# Patient Record
Sex: Male | Born: 1960 | Race: Black or African American | Hispanic: No | Marital: Single | Smoking: Former smoker
Health system: Southern US, Community
[De-identification: ages and names within clinical notes are randomized; demographics above are authoritative.]

## PROBLEM LIST (undated history)

## (undated) DIAGNOSIS — E1140 Type 2 diabetes mellitus with diabetic neuropathy, unspecified: Secondary | ICD-10-CM

## (undated) DIAGNOSIS — I10 Essential (primary) hypertension: Secondary | ICD-10-CM

## (undated) DIAGNOSIS — F209 Schizophrenia, unspecified: Secondary | ICD-10-CM

## (undated) DIAGNOSIS — E7800 Pure hypercholesterolemia, unspecified: Secondary | ICD-10-CM

## (undated) HISTORY — PX: TONSILLECTOMY: SUR1361

---

## 2001-06-11 ENCOUNTER — Emergency Department (HOSPITAL_COMMUNITY): Admission: EM | Admit: 2001-06-11 | Discharge: 2001-06-11 | Payer: Self-pay | Admitting: Emergency Medicine

## 2001-06-16 ENCOUNTER — Emergency Department (HOSPITAL_COMMUNITY): Admission: EM | Admit: 2001-06-16 | Discharge: 2001-06-16 | Payer: Self-pay | Admitting: Emergency Medicine

## 2001-08-16 ENCOUNTER — Emergency Department (HOSPITAL_COMMUNITY): Admission: EM | Admit: 2001-08-16 | Discharge: 2001-08-16 | Payer: Self-pay | Admitting: *Deleted

## 2001-08-27 ENCOUNTER — Emergency Department (HOSPITAL_COMMUNITY): Admission: EM | Admit: 2001-08-27 | Discharge: 2001-08-27 | Payer: Self-pay | Admitting: Internal Medicine

## 2001-11-06 ENCOUNTER — Emergency Department (HOSPITAL_COMMUNITY): Admission: EM | Admit: 2001-11-06 | Discharge: 2001-11-06 | Payer: Self-pay | Admitting: Emergency Medicine

## 2001-11-09 ENCOUNTER — Emergency Department (HOSPITAL_COMMUNITY): Admission: EM | Admit: 2001-11-09 | Discharge: 2001-11-09 | Payer: Self-pay | Admitting: *Deleted

## 2001-12-04 ENCOUNTER — Emergency Department (HOSPITAL_COMMUNITY): Admission: EM | Admit: 2001-12-04 | Discharge: 2001-12-04 | Payer: Self-pay | Admitting: Internal Medicine

## 2001-12-04 ENCOUNTER — Encounter: Payer: Self-pay | Admitting: *Deleted

## 2001-12-23 ENCOUNTER — Emergency Department (HOSPITAL_COMMUNITY): Admission: EM | Admit: 2001-12-23 | Discharge: 2001-12-23 | Payer: Self-pay | Admitting: Emergency Medicine

## 2002-03-15 ENCOUNTER — Emergency Department (HOSPITAL_COMMUNITY): Admission: EM | Admit: 2002-03-15 | Discharge: 2002-03-15 | Payer: Self-pay | Admitting: *Deleted

## 2002-03-19 ENCOUNTER — Emergency Department (HOSPITAL_COMMUNITY): Admission: EM | Admit: 2002-03-19 | Discharge: 2002-03-19 | Payer: Self-pay | Admitting: Emergency Medicine

## 2002-03-20 ENCOUNTER — Emergency Department (HOSPITAL_COMMUNITY): Admission: EM | Admit: 2002-03-20 | Discharge: 2002-03-20 | Payer: Self-pay | Admitting: Emergency Medicine

## 2002-03-21 ENCOUNTER — Emergency Department (HOSPITAL_COMMUNITY): Admission: EM | Admit: 2002-03-21 | Discharge: 2002-03-21 | Payer: Self-pay | Admitting: Emergency Medicine

## 2002-03-23 ENCOUNTER — Emergency Department (HOSPITAL_COMMUNITY): Admission: EM | Admit: 2002-03-23 | Discharge: 2002-03-23 | Payer: Self-pay | Admitting: *Deleted

## 2002-04-12 ENCOUNTER — Emergency Department (HOSPITAL_COMMUNITY): Admission: EM | Admit: 2002-04-12 | Discharge: 2002-04-12 | Payer: Self-pay | Admitting: Emergency Medicine

## 2002-06-27 ENCOUNTER — Emergency Department (HOSPITAL_COMMUNITY): Admission: EM | Admit: 2002-06-27 | Discharge: 2002-06-28 | Payer: Self-pay | Admitting: Emergency Medicine

## 2002-06-28 ENCOUNTER — Emergency Department (HOSPITAL_COMMUNITY): Admission: EM | Admit: 2002-06-28 | Discharge: 2002-06-28 | Payer: Self-pay | Admitting: Emergency Medicine

## 2002-07-06 ENCOUNTER — Emergency Department (HOSPITAL_COMMUNITY): Admission: EM | Admit: 2002-07-06 | Discharge: 2002-07-06 | Payer: Self-pay | Admitting: Emergency Medicine

## 2002-07-15 ENCOUNTER — Emergency Department (HOSPITAL_COMMUNITY): Admission: EM | Admit: 2002-07-15 | Discharge: 2002-07-15 | Payer: Self-pay | Admitting: Emergency Medicine

## 2002-07-21 ENCOUNTER — Emergency Department (HOSPITAL_COMMUNITY): Admission: EM | Admit: 2002-07-21 | Discharge: 2002-07-21 | Payer: Self-pay | Admitting: Emergency Medicine

## 2002-08-06 ENCOUNTER — Emergency Department (HOSPITAL_COMMUNITY): Admission: EM | Admit: 2002-08-06 | Discharge: 2002-08-06 | Payer: Self-pay | Admitting: Emergency Medicine

## 2002-08-16 ENCOUNTER — Emergency Department (HOSPITAL_COMMUNITY): Admission: EM | Admit: 2002-08-16 | Discharge: 2002-08-17 | Payer: Self-pay

## 2002-08-21 ENCOUNTER — Emergency Department (HOSPITAL_COMMUNITY): Admission: EM | Admit: 2002-08-21 | Discharge: 2002-08-21 | Payer: Self-pay | Admitting: Emergency Medicine

## 2002-08-22 ENCOUNTER — Emergency Department (HOSPITAL_COMMUNITY): Admission: EM | Admit: 2002-08-22 | Discharge: 2002-08-22 | Payer: Self-pay | Admitting: Emergency Medicine

## 2002-09-09 ENCOUNTER — Observation Stay (HOSPITAL_COMMUNITY): Admission: EM | Admit: 2002-09-09 | Discharge: 2002-09-10 | Payer: Self-pay | Admitting: Internal Medicine

## 2002-09-09 ENCOUNTER — Encounter: Payer: Self-pay | Admitting: Internal Medicine

## 2002-11-06 ENCOUNTER — Emergency Department (HOSPITAL_COMMUNITY): Admission: EM | Admit: 2002-11-06 | Discharge: 2002-11-06 | Payer: Self-pay | Admitting: *Deleted

## 2002-11-18 ENCOUNTER — Emergency Department (HOSPITAL_COMMUNITY): Admission: EM | Admit: 2002-11-18 | Discharge: 2002-11-19 | Payer: Self-pay | Admitting: Emergency Medicine

## 2002-11-19 ENCOUNTER — Emergency Department (HOSPITAL_COMMUNITY): Admission: EM | Admit: 2002-11-19 | Discharge: 2002-11-19 | Payer: Self-pay | Admitting: Emergency Medicine

## 2003-02-15 ENCOUNTER — Encounter: Payer: Self-pay | Admitting: Emergency Medicine

## 2003-02-15 ENCOUNTER — Emergency Department (HOSPITAL_COMMUNITY): Admission: EM | Admit: 2003-02-15 | Discharge: 2003-02-15 | Payer: Self-pay | Admitting: Emergency Medicine

## 2003-02-25 ENCOUNTER — Emergency Department (HOSPITAL_COMMUNITY): Admission: EM | Admit: 2003-02-25 | Discharge: 2003-02-25 | Payer: Self-pay | Admitting: *Deleted

## 2003-03-19 ENCOUNTER — Emergency Department (HOSPITAL_COMMUNITY): Admission: EM | Admit: 2003-03-19 | Discharge: 2003-03-19 | Payer: Self-pay | Admitting: Internal Medicine

## 2003-04-12 ENCOUNTER — Emergency Department (HOSPITAL_COMMUNITY): Admission: EM | Admit: 2003-04-12 | Discharge: 2003-04-12 | Payer: Self-pay | Admitting: Emergency Medicine

## 2003-04-23 ENCOUNTER — Emergency Department (HOSPITAL_COMMUNITY): Admission: EM | Admit: 2003-04-23 | Discharge: 2003-04-23 | Payer: Self-pay | Admitting: Emergency Medicine

## 2003-05-22 ENCOUNTER — Emergency Department (HOSPITAL_COMMUNITY): Admission: EM | Admit: 2003-05-22 | Discharge: 2003-05-22 | Payer: Self-pay | Admitting: Emergency Medicine

## 2003-06-04 ENCOUNTER — Emergency Department (HOSPITAL_COMMUNITY): Admission: EM | Admit: 2003-06-04 | Discharge: 2003-06-04 | Payer: Self-pay | Admitting: *Deleted

## 2003-07-05 ENCOUNTER — Encounter: Payer: Self-pay | Admitting: *Deleted

## 2003-07-05 ENCOUNTER — Emergency Department (HOSPITAL_COMMUNITY): Admission: EM | Admit: 2003-07-05 | Discharge: 2003-07-05 | Payer: Self-pay | Admitting: *Deleted

## 2003-09-10 ENCOUNTER — Emergency Department (HOSPITAL_COMMUNITY): Admission: EM | Admit: 2003-09-10 | Discharge: 2003-09-10 | Payer: Self-pay | Admitting: Emergency Medicine

## 2003-09-21 ENCOUNTER — Emergency Department (HOSPITAL_COMMUNITY): Admission: EM | Admit: 2003-09-21 | Discharge: 2003-09-21 | Payer: Self-pay | Admitting: Emergency Medicine

## 2003-09-23 ENCOUNTER — Emergency Department (HOSPITAL_COMMUNITY): Admission: EM | Admit: 2003-09-23 | Discharge: 2003-09-23 | Payer: Self-pay | Admitting: Family Medicine

## 2003-10-24 ENCOUNTER — Emergency Department (HOSPITAL_COMMUNITY): Admission: EM | Admit: 2003-10-24 | Discharge: 2003-10-24 | Payer: Self-pay | Admitting: Emergency Medicine

## 2003-10-25 ENCOUNTER — Emergency Department (HOSPITAL_COMMUNITY): Admission: EM | Admit: 2003-10-25 | Discharge: 2003-10-25 | Payer: Self-pay | Admitting: Emergency Medicine

## 2003-10-31 ENCOUNTER — Emergency Department (HOSPITAL_COMMUNITY): Admission: EM | Admit: 2003-10-31 | Discharge: 2003-10-31 | Payer: Self-pay | Admitting: Emergency Medicine

## 2003-11-09 ENCOUNTER — Emergency Department (HOSPITAL_COMMUNITY): Admission: EM | Admit: 2003-11-09 | Discharge: 2003-11-10 | Payer: Self-pay | Admitting: Emergency Medicine

## 2003-11-10 ENCOUNTER — Emergency Department (HOSPITAL_COMMUNITY): Admission: EM | Admit: 2003-11-10 | Discharge: 2003-11-10 | Payer: Self-pay | Admitting: Emergency Medicine

## 2003-11-14 ENCOUNTER — Emergency Department (HOSPITAL_COMMUNITY): Admission: EM | Admit: 2003-11-14 | Discharge: 2003-11-14 | Payer: Self-pay | Admitting: Emergency Medicine

## 2003-11-19 ENCOUNTER — Emergency Department (HOSPITAL_COMMUNITY): Admission: EM | Admit: 2003-11-19 | Discharge: 2003-11-19 | Payer: Self-pay | Admitting: *Deleted

## 2003-12-08 ENCOUNTER — Emergency Department (HOSPITAL_COMMUNITY): Admission: EM | Admit: 2003-12-08 | Discharge: 2003-12-08 | Payer: Self-pay | Admitting: Emergency Medicine

## 2003-12-29 ENCOUNTER — Emergency Department (HOSPITAL_COMMUNITY): Admission: EM | Admit: 2003-12-29 | Discharge: 2003-12-30 | Payer: Self-pay | Admitting: Emergency Medicine

## 2004-02-03 ENCOUNTER — Emergency Department (HOSPITAL_COMMUNITY): Admission: EM | Admit: 2004-02-03 | Discharge: 2004-02-03 | Payer: Self-pay | Admitting: Emergency Medicine

## 2004-02-07 ENCOUNTER — Emergency Department (HOSPITAL_COMMUNITY): Admission: EM | Admit: 2004-02-07 | Discharge: 2004-02-07 | Payer: Self-pay | Admitting: *Deleted

## 2004-02-24 ENCOUNTER — Emergency Department (HOSPITAL_COMMUNITY): Admission: EM | Admit: 2004-02-24 | Discharge: 2004-02-25 | Payer: Self-pay | Admitting: *Deleted

## 2004-03-10 ENCOUNTER — Emergency Department (HOSPITAL_COMMUNITY): Admission: EM | Admit: 2004-03-10 | Discharge: 2004-03-10 | Payer: Self-pay | Admitting: Emergency Medicine

## 2004-03-15 ENCOUNTER — Emergency Department (HOSPITAL_COMMUNITY): Admission: EM | Admit: 2004-03-15 | Discharge: 2004-03-15 | Payer: Self-pay | Admitting: Emergency Medicine

## 2004-03-17 ENCOUNTER — Emergency Department (HOSPITAL_COMMUNITY): Admission: EM | Admit: 2004-03-17 | Discharge: 2004-03-17 | Payer: Self-pay | Admitting: Emergency Medicine

## 2004-03-17 ENCOUNTER — Emergency Department (HOSPITAL_COMMUNITY): Admission: EM | Admit: 2004-03-17 | Discharge: 2004-03-18 | Payer: Self-pay | Admitting: Emergency Medicine

## 2004-04-08 ENCOUNTER — Emergency Department (HOSPITAL_COMMUNITY): Admission: EM | Admit: 2004-04-08 | Discharge: 2004-04-08 | Payer: Self-pay | Admitting: Emergency Medicine

## 2004-05-06 ENCOUNTER — Emergency Department (HOSPITAL_COMMUNITY): Admission: EM | Admit: 2004-05-06 | Discharge: 2004-05-06 | Payer: Self-pay | Admitting: Emergency Medicine

## 2004-05-08 ENCOUNTER — Emergency Department (HOSPITAL_COMMUNITY): Admission: EM | Admit: 2004-05-08 | Discharge: 2004-05-08 | Payer: Self-pay | Admitting: Emergency Medicine

## 2004-05-12 ENCOUNTER — Emergency Department (HOSPITAL_COMMUNITY): Admission: EM | Admit: 2004-05-12 | Discharge: 2004-05-12 | Payer: Self-pay | Admitting: Emergency Medicine

## 2004-05-31 ENCOUNTER — Emergency Department (HOSPITAL_COMMUNITY): Admission: EM | Admit: 2004-05-31 | Discharge: 2004-05-31 | Payer: Self-pay | Admitting: Emergency Medicine

## 2004-06-19 ENCOUNTER — Emergency Department (HOSPITAL_COMMUNITY): Admission: EM | Admit: 2004-06-19 | Discharge: 2004-06-20 | Payer: Self-pay | Admitting: Emergency Medicine

## 2004-06-26 ENCOUNTER — Emergency Department (HOSPITAL_COMMUNITY): Admission: EM | Admit: 2004-06-26 | Discharge: 2004-06-26 | Payer: Self-pay | Admitting: Internal Medicine

## 2004-07-04 ENCOUNTER — Emergency Department (HOSPITAL_COMMUNITY): Admission: EM | Admit: 2004-07-04 | Discharge: 2004-07-04 | Payer: Self-pay | Admitting: Emergency Medicine

## 2004-07-10 ENCOUNTER — Emergency Department (HOSPITAL_COMMUNITY): Admission: EM | Admit: 2004-07-10 | Discharge: 2004-07-11 | Payer: Self-pay | Admitting: Emergency Medicine

## 2004-07-23 ENCOUNTER — Emergency Department (HOSPITAL_COMMUNITY): Admission: EM | Admit: 2004-07-23 | Discharge: 2004-07-23 | Payer: Self-pay | Admitting: Emergency Medicine

## 2004-07-27 ENCOUNTER — Emergency Department (HOSPITAL_COMMUNITY): Admission: EM | Admit: 2004-07-27 | Discharge: 2004-07-27 | Payer: Self-pay | Admitting: *Deleted

## 2004-09-06 ENCOUNTER — Emergency Department (HOSPITAL_COMMUNITY): Admission: EM | Admit: 2004-09-06 | Discharge: 2004-09-06 | Payer: Self-pay | Admitting: Emergency Medicine

## 2004-09-28 ENCOUNTER — Emergency Department (HOSPITAL_COMMUNITY): Admission: EM | Admit: 2004-09-28 | Discharge: 2004-09-28 | Payer: Self-pay | Admitting: Emergency Medicine

## 2004-10-01 ENCOUNTER — Emergency Department (HOSPITAL_COMMUNITY): Admission: EM | Admit: 2004-10-01 | Discharge: 2004-10-01 | Payer: Self-pay | Admitting: Emergency Medicine

## 2004-10-16 ENCOUNTER — Emergency Department (HOSPITAL_COMMUNITY): Admission: EM | Admit: 2004-10-16 | Discharge: 2004-10-16 | Payer: Self-pay | Admitting: *Deleted

## 2004-10-21 ENCOUNTER — Emergency Department (HOSPITAL_COMMUNITY): Admission: EM | Admit: 2004-10-21 | Discharge: 2004-10-21 | Payer: Self-pay | Admitting: Emergency Medicine

## 2004-10-28 ENCOUNTER — Emergency Department (HOSPITAL_COMMUNITY): Admission: EM | Admit: 2004-10-28 | Discharge: 2004-10-28 | Payer: Self-pay | Admitting: Emergency Medicine

## 2004-11-01 ENCOUNTER — Emergency Department (HOSPITAL_COMMUNITY): Admission: EM | Admit: 2004-11-01 | Discharge: 2004-11-01 | Payer: Self-pay | Admitting: Emergency Medicine

## 2004-11-09 ENCOUNTER — Emergency Department (HOSPITAL_COMMUNITY): Admission: EM | Admit: 2004-11-09 | Discharge: 2004-11-09 | Payer: Self-pay | Admitting: Emergency Medicine

## 2004-11-18 ENCOUNTER — Emergency Department (HOSPITAL_COMMUNITY): Admission: EM | Admit: 2004-11-18 | Discharge: 2004-11-18 | Payer: Self-pay | Admitting: *Deleted

## 2004-11-20 ENCOUNTER — Emergency Department (HOSPITAL_COMMUNITY): Admission: EM | Admit: 2004-11-20 | Discharge: 2004-11-20 | Payer: Self-pay | Admitting: Emergency Medicine

## 2004-12-01 ENCOUNTER — Emergency Department (HOSPITAL_COMMUNITY): Admission: EM | Admit: 2004-12-01 | Discharge: 2004-12-02 | Payer: Self-pay | Admitting: Emergency Medicine

## 2004-12-02 ENCOUNTER — Emergency Department (HOSPITAL_COMMUNITY): Admission: EM | Admit: 2004-12-02 | Discharge: 2004-12-02 | Payer: Self-pay | Admitting: Emergency Medicine

## 2004-12-03 ENCOUNTER — Emergency Department (HOSPITAL_COMMUNITY): Admission: EM | Admit: 2004-12-03 | Discharge: 2004-12-04 | Payer: Self-pay | Admitting: Emergency Medicine

## 2004-12-24 ENCOUNTER — Emergency Department (HOSPITAL_COMMUNITY): Admission: EM | Admit: 2004-12-24 | Discharge: 2004-12-24 | Payer: Self-pay | Admitting: Emergency Medicine

## 2005-01-15 ENCOUNTER — Emergency Department (HOSPITAL_COMMUNITY): Admission: EM | Admit: 2005-01-15 | Discharge: 2005-01-15 | Payer: Self-pay | Admitting: Emergency Medicine

## 2005-02-02 ENCOUNTER — Emergency Department (HOSPITAL_COMMUNITY): Admission: EM | Admit: 2005-02-02 | Discharge: 2005-02-02 | Payer: Self-pay | Admitting: Emergency Medicine

## 2005-02-19 ENCOUNTER — Emergency Department (HOSPITAL_COMMUNITY): Admission: EM | Admit: 2005-02-19 | Discharge: 2005-02-19 | Payer: Self-pay | Admitting: Emergency Medicine

## 2005-02-28 ENCOUNTER — Emergency Department (HOSPITAL_COMMUNITY): Admission: EM | Admit: 2005-02-28 | Discharge: 2005-02-28 | Payer: Self-pay | Admitting: Emergency Medicine

## 2005-03-01 ENCOUNTER — Emergency Department (HOSPITAL_COMMUNITY): Admission: EM | Admit: 2005-03-01 | Discharge: 2005-03-01 | Payer: Self-pay | Admitting: *Deleted

## 2005-03-31 ENCOUNTER — Emergency Department (HOSPITAL_COMMUNITY): Admission: EM | Admit: 2005-03-31 | Discharge: 2005-03-31 | Payer: Self-pay | Admitting: *Deleted

## 2005-05-03 ENCOUNTER — Ambulatory Visit: Payer: Self-pay | Admitting: Family Medicine

## 2005-06-04 ENCOUNTER — Ambulatory Visit: Payer: Self-pay | Admitting: Family Medicine

## 2005-09-23 ENCOUNTER — Emergency Department (HOSPITAL_COMMUNITY): Admission: EM | Admit: 2005-09-23 | Discharge: 2005-09-23 | Payer: Self-pay | Admitting: Emergency Medicine

## 2005-10-13 ENCOUNTER — Emergency Department (HOSPITAL_COMMUNITY): Admission: EM | Admit: 2005-10-13 | Discharge: 2005-10-13 | Payer: Self-pay | Admitting: Emergency Medicine

## 2005-11-29 ENCOUNTER — Emergency Department (HOSPITAL_COMMUNITY): Admission: EM | Admit: 2005-11-29 | Discharge: 2005-11-29 | Payer: Self-pay | Admitting: Emergency Medicine

## 2006-12-03 ENCOUNTER — Ambulatory Visit (HOSPITAL_COMMUNITY): Admission: RE | Admit: 2006-12-03 | Discharge: 2006-12-03 | Payer: Self-pay | Admitting: Pulmonary Disease

## 2007-01-15 ENCOUNTER — Emergency Department (HOSPITAL_COMMUNITY): Admission: EM | Admit: 2007-01-15 | Discharge: 2007-01-15 | Payer: Self-pay | Admitting: Emergency Medicine

## 2007-03-21 ENCOUNTER — Emergency Department (HOSPITAL_COMMUNITY): Admission: EM | Admit: 2007-03-21 | Discharge: 2007-03-21 | Payer: Self-pay | Admitting: Emergency Medicine

## 2007-03-27 ENCOUNTER — Emergency Department (HOSPITAL_COMMUNITY): Admission: EM | Admit: 2007-03-27 | Discharge: 2007-03-27 | Payer: Self-pay | Admitting: Emergency Medicine

## 2007-04-01 ENCOUNTER — Emergency Department (HOSPITAL_COMMUNITY): Admission: EM | Admit: 2007-04-01 | Discharge: 2007-04-01 | Payer: Self-pay | Admitting: Emergency Medicine

## 2007-04-17 ENCOUNTER — Emergency Department (HOSPITAL_COMMUNITY): Admission: EM | Admit: 2007-04-17 | Discharge: 2007-04-17 | Payer: Self-pay | Admitting: Emergency Medicine

## 2007-05-21 ENCOUNTER — Emergency Department (HOSPITAL_COMMUNITY): Admission: EM | Admit: 2007-05-21 | Discharge: 2007-05-21 | Payer: Self-pay | Admitting: Emergency Medicine

## 2007-06-05 ENCOUNTER — Emergency Department (HOSPITAL_COMMUNITY): Admission: EM | Admit: 2007-06-05 | Discharge: 2007-06-05 | Payer: Self-pay | Admitting: Emergency Medicine

## 2007-06-12 ENCOUNTER — Emergency Department (HOSPITAL_COMMUNITY): Admission: EM | Admit: 2007-06-12 | Discharge: 2007-06-12 | Payer: Self-pay | Admitting: Emergency Medicine

## 2007-06-24 ENCOUNTER — Emergency Department (HOSPITAL_COMMUNITY): Admission: EM | Admit: 2007-06-24 | Discharge: 2007-06-24 | Payer: Self-pay | Admitting: Emergency Medicine

## 2007-06-29 ENCOUNTER — Emergency Department (HOSPITAL_COMMUNITY): Admission: EM | Admit: 2007-06-29 | Discharge: 2007-06-29 | Payer: Self-pay | Admitting: Emergency Medicine

## 2007-07-07 ENCOUNTER — Ambulatory Visit: Payer: Self-pay | Admitting: Orthopedic Surgery

## 2007-08-21 ENCOUNTER — Ambulatory Visit: Payer: Self-pay | Admitting: Orthopedic Surgery

## 2007-11-11 ENCOUNTER — Emergency Department (HOSPITAL_COMMUNITY): Admission: EM | Admit: 2007-11-11 | Discharge: 2007-11-11 | Payer: Self-pay | Admitting: Emergency Medicine

## 2007-11-19 ENCOUNTER — Ambulatory Visit (HOSPITAL_COMMUNITY): Admission: RE | Admit: 2007-11-19 | Discharge: 2007-11-19 | Payer: Self-pay | Admitting: Pulmonary Disease

## 2007-12-10 ENCOUNTER — Emergency Department (HOSPITAL_COMMUNITY): Admission: EM | Admit: 2007-12-10 | Discharge: 2007-12-10 | Payer: Self-pay | Admitting: Emergency Medicine

## 2007-12-11 ENCOUNTER — Emergency Department (HOSPITAL_COMMUNITY): Admission: EM | Admit: 2007-12-11 | Discharge: 2007-12-11 | Payer: Self-pay | Admitting: Emergency Medicine

## 2007-12-25 ENCOUNTER — Encounter: Payer: Self-pay | Admitting: Family Medicine

## 2008-01-04 ENCOUNTER — Emergency Department (HOSPITAL_COMMUNITY): Admission: EM | Admit: 2008-01-04 | Discharge: 2008-01-04 | Payer: Self-pay | Admitting: Emergency Medicine

## 2008-02-09 ENCOUNTER — Emergency Department (HOSPITAL_COMMUNITY): Admission: EM | Admit: 2008-02-09 | Discharge: 2008-02-09 | Payer: Self-pay | Admitting: Emergency Medicine

## 2008-02-28 ENCOUNTER — Emergency Department (HOSPITAL_COMMUNITY): Admission: EM | Admit: 2008-02-28 | Discharge: 2008-02-28 | Payer: Self-pay | Admitting: Emergency Medicine

## 2008-04-12 ENCOUNTER — Emergency Department (HOSPITAL_COMMUNITY): Admission: EM | Admit: 2008-04-12 | Discharge: 2008-04-12 | Payer: Self-pay | Admitting: Emergency Medicine

## 2008-06-17 ENCOUNTER — Emergency Department (HOSPITAL_COMMUNITY): Admission: EM | Admit: 2008-06-17 | Discharge: 2008-06-17 | Payer: Self-pay | Admitting: Emergency Medicine

## 2008-06-22 ENCOUNTER — Emergency Department (HOSPITAL_COMMUNITY): Admission: EM | Admit: 2008-06-22 | Discharge: 2008-06-22 | Payer: Self-pay | Admitting: Emergency Medicine

## 2008-06-26 ENCOUNTER — Emergency Department (HOSPITAL_COMMUNITY): Admission: EM | Admit: 2008-06-26 | Discharge: 2008-06-26 | Payer: Self-pay | Admitting: Emergency Medicine

## 2008-09-15 ENCOUNTER — Emergency Department (HOSPITAL_COMMUNITY): Admission: EM | Admit: 2008-09-15 | Discharge: 2008-09-16 | Payer: Self-pay | Admitting: Emergency Medicine

## 2008-09-18 ENCOUNTER — Inpatient Hospital Stay (HOSPITAL_COMMUNITY): Admission: EM | Admit: 2008-09-18 | Discharge: 2008-09-24 | Payer: Self-pay | Admitting: Emergency Medicine

## 2008-09-22 ENCOUNTER — Encounter: Payer: Self-pay | Admitting: Internal Medicine

## 2008-12-15 ENCOUNTER — Ambulatory Visit (HOSPITAL_COMMUNITY): Admission: RE | Admit: 2008-12-15 | Discharge: 2008-12-15 | Payer: Self-pay | Admitting: Pulmonary Disease

## 2009-01-14 ENCOUNTER — Emergency Department (HOSPITAL_COMMUNITY): Admission: EM | Admit: 2009-01-14 | Discharge: 2009-01-14 | Payer: Self-pay | Admitting: Emergency Medicine

## 2009-02-02 ENCOUNTER — Emergency Department (HOSPITAL_COMMUNITY): Admission: EM | Admit: 2009-02-02 | Discharge: 2009-02-04 | Payer: Self-pay | Admitting: Emergency Medicine

## 2009-03-10 ENCOUNTER — Emergency Department (HOSPITAL_COMMUNITY): Admission: EM | Admit: 2009-03-10 | Discharge: 2009-03-10 | Payer: Self-pay | Admitting: Emergency Medicine

## 2009-05-30 ENCOUNTER — Other Ambulatory Visit: Payer: Self-pay | Admitting: Emergency Medicine

## 2009-05-30 ENCOUNTER — Inpatient Hospital Stay (HOSPITAL_COMMUNITY): Admission: EM | Admit: 2009-05-30 | Discharge: 2009-06-03 | Payer: Self-pay | Admitting: Psychiatry

## 2009-05-31 ENCOUNTER — Ambulatory Visit: Payer: Self-pay | Admitting: Psychiatry

## 2009-07-04 ENCOUNTER — Inpatient Hospital Stay (HOSPITAL_COMMUNITY): Admission: EM | Admit: 2009-07-04 | Discharge: 2009-07-17 | Payer: Self-pay | Admitting: Emergency Medicine

## 2009-07-13 ENCOUNTER — Encounter: Payer: Self-pay | Admitting: Infectious Disease

## 2009-07-14 ENCOUNTER — Encounter: Payer: Self-pay | Admitting: Internal Medicine

## 2009-07-14 ENCOUNTER — Ambulatory Visit: Payer: Self-pay | Admitting: Infectious Diseases

## 2009-07-14 ENCOUNTER — Ambulatory Visit: Payer: Self-pay | Admitting: Vascular Surgery

## 2009-08-07 ENCOUNTER — Emergency Department (HOSPITAL_COMMUNITY): Admission: EM | Admit: 2009-08-07 | Discharge: 2009-08-07 | Payer: Self-pay | Admitting: Emergency Medicine

## 2009-08-17 ENCOUNTER — Emergency Department (HOSPITAL_COMMUNITY): Admission: EM | Admit: 2009-08-17 | Discharge: 2009-08-17 | Payer: Self-pay | Admitting: Emergency Medicine

## 2009-09-25 ENCOUNTER — Emergency Department (HOSPITAL_COMMUNITY): Admission: EM | Admit: 2009-09-25 | Discharge: 2009-09-25 | Payer: Self-pay | Admitting: Emergency Medicine

## 2009-09-28 ENCOUNTER — Other Ambulatory Visit: Payer: Self-pay | Admitting: Emergency Medicine

## 2009-09-29 ENCOUNTER — Ambulatory Visit: Payer: Self-pay | Admitting: Psychiatry

## 2009-09-29 ENCOUNTER — Inpatient Hospital Stay (HOSPITAL_COMMUNITY): Admission: RE | Admit: 2009-09-29 | Discharge: 2009-10-03 | Payer: Self-pay | Admitting: Psychiatry

## 2009-10-06 ENCOUNTER — Emergency Department (HOSPITAL_COMMUNITY): Admission: EM | Admit: 2009-10-06 | Discharge: 2009-10-06 | Payer: Self-pay | Admitting: Emergency Medicine

## 2009-10-09 ENCOUNTER — Emergency Department (HOSPITAL_COMMUNITY): Admission: EM | Admit: 2009-10-09 | Discharge: 2009-10-09 | Payer: Self-pay | Admitting: Emergency Medicine

## 2009-10-12 ENCOUNTER — Emergency Department (HOSPITAL_COMMUNITY): Admission: EM | Admit: 2009-10-12 | Discharge: 2009-10-13 | Payer: Self-pay | Admitting: Emergency Medicine

## 2009-10-25 ENCOUNTER — Inpatient Hospital Stay (HOSPITAL_COMMUNITY): Admission: EM | Admit: 2009-10-25 | Discharge: 2009-10-29 | Payer: Self-pay | Admitting: Emergency Medicine

## 2009-10-26 ENCOUNTER — Ambulatory Visit: Payer: Self-pay | Admitting: Vascular Surgery

## 2009-10-26 ENCOUNTER — Encounter (INDEPENDENT_AMBULATORY_CARE_PROVIDER_SITE_OTHER): Payer: Self-pay | Admitting: Internal Medicine

## 2009-11-01 ENCOUNTER — Other Ambulatory Visit: Payer: Self-pay | Admitting: Emergency Medicine

## 2009-11-01 ENCOUNTER — Other Ambulatory Visit: Payer: Self-pay

## 2009-11-01 ENCOUNTER — Ambulatory Visit: Payer: Self-pay | Admitting: Psychiatry

## 2009-11-02 ENCOUNTER — Inpatient Hospital Stay (HOSPITAL_COMMUNITY): Admission: AD | Admit: 2009-11-02 | Discharge: 2009-11-03 | Payer: Self-pay | Admitting: Psychiatry

## 2009-11-14 ENCOUNTER — Emergency Department (HOSPITAL_COMMUNITY): Admission: EM | Admit: 2009-11-14 | Discharge: 2009-11-15 | Payer: Self-pay | Admitting: Emergency Medicine

## 2009-11-22 ENCOUNTER — Emergency Department (HOSPITAL_COMMUNITY): Admission: EM | Admit: 2009-11-22 | Discharge: 2009-11-22 | Payer: Self-pay | Admitting: Emergency Medicine

## 2009-11-28 ENCOUNTER — Emergency Department (HOSPITAL_COMMUNITY): Admission: EM | Admit: 2009-11-28 | Discharge: 2009-11-29 | Payer: Self-pay | Admitting: Emergency Medicine

## 2009-12-12 ENCOUNTER — Ambulatory Visit (HOSPITAL_BASED_OUTPATIENT_CLINIC_OR_DEPARTMENT_OTHER): Admission: RE | Admit: 2009-12-12 | Discharge: 2009-12-12 | Payer: Self-pay | Admitting: Family Medicine

## 2009-12-18 ENCOUNTER — Ambulatory Visit: Payer: Self-pay | Admitting: Internal Medicine

## 2010-01-07 ENCOUNTER — Emergency Department (HOSPITAL_COMMUNITY): Admission: EM | Admit: 2010-01-07 | Discharge: 2010-01-08 | Payer: Self-pay | Admitting: Emergency Medicine

## 2010-04-12 ENCOUNTER — Emergency Department (HOSPITAL_COMMUNITY): Admission: EM | Admit: 2010-04-12 | Discharge: 2010-04-12 | Payer: Self-pay | Admitting: Emergency Medicine

## 2010-04-24 ENCOUNTER — Emergency Department (HOSPITAL_COMMUNITY): Admission: EM | Admit: 2010-04-24 | Discharge: 2010-04-24 | Payer: Self-pay | Admitting: Emergency Medicine

## 2010-07-29 ENCOUNTER — Emergency Department (HOSPITAL_COMMUNITY): Admission: EM | Admit: 2010-07-29 | Discharge: 2010-07-30 | Payer: Self-pay | Admitting: Emergency Medicine

## 2010-07-30 IMAGING — CT CT EXTREM LOW W/ CM*L*
1 of 3 series · 1 of 14 positions shown, 2 images · IV contrast (agent unspecified)
Comparison: None available.

CLINICAL DATA: Swelling and pain.  Cellulitis.  Question abscess.

CT LEFT LOWER LEG WITH CONTRAST
TECHNIQUE: Multidetector CT imaging of the left lower leg was
performed according to the standard protocol following intravenous
contrast administration. Multiplanar CT image reconstructions were
also generated.
Contrast: 100 ml [id number].

[Series 2: control scan 2.0 b60s · axial · 0.32mm/px · z∈[-1140,-1140]mm · 1 of 1 slices shown, 2 images]
[im 1/1  soft-tissue]
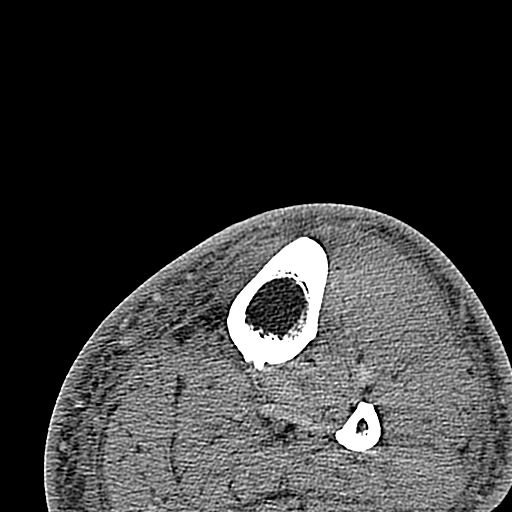
[im 1/1  bone]
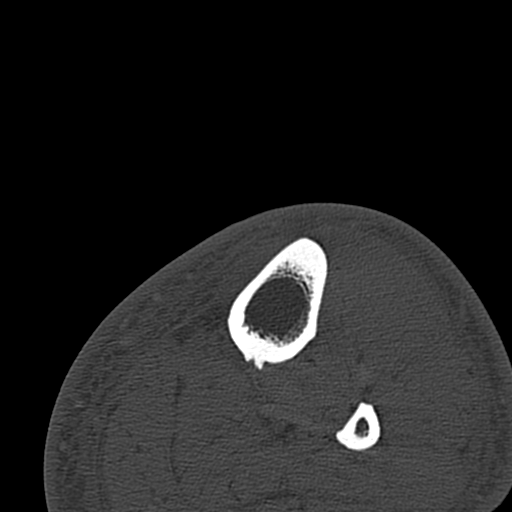

[1 of 14 positions shown; findings below may reference images not displayed]

FINDINGS: There is diffuse subcutaneous edema about the left lower
leg.  No rim enhancing fluid collection to suggest abscess is
identified.  No intramuscular fluid collection is seen.  Fat planes
are preserved.  No notable fascial enhancement is identified.  No
bony destructive change is seen.  There is no fracture.
IMPRESSION: Cellulitis without underlying abscess or CT evidence of
osteomyelitis.  No evidence of myositis.

## 2010-08-06 ENCOUNTER — Emergency Department (HOSPITAL_COMMUNITY): Admission: EM | Admit: 2010-08-06 | Discharge: 2010-08-06 | Payer: Self-pay | Admitting: Emergency Medicine

## 2010-08-23 ENCOUNTER — Emergency Department (HOSPITAL_COMMUNITY): Admission: EM | Admit: 2010-08-23 | Discharge: 2010-08-24 | Payer: Self-pay | Admitting: Emergency Medicine

## 2010-09-05 ENCOUNTER — Encounter: Payer: Self-pay | Admitting: Internal Medicine

## 2010-10-14 ENCOUNTER — Emergency Department (HOSPITAL_COMMUNITY): Admission: EM | Admit: 2010-10-14 | Discharge: 2010-10-14 | Payer: Self-pay | Admitting: Emergency Medicine

## 2010-10-21 ENCOUNTER — Emergency Department (HOSPITAL_COMMUNITY): Admission: EM | Admit: 2010-10-21 | Discharge: 2010-10-22 | Payer: Self-pay | Admitting: Emergency Medicine

## 2010-11-09 ENCOUNTER — Emergency Department (HOSPITAL_COMMUNITY): Admission: EM | Admit: 2010-11-09 | Discharge: 2010-11-09 | Payer: Self-pay | Admitting: Emergency Medicine

## 2010-11-30 ENCOUNTER — Emergency Department (HOSPITAL_COMMUNITY): Admission: EM | Admit: 2010-11-30 | Discharge: 2010-08-12 | Payer: Self-pay | Admitting: Emergency Medicine

## 2010-11-30 ENCOUNTER — Emergency Department (HOSPITAL_COMMUNITY): Admission: EM | Admit: 2010-11-30 | Discharge: 2010-03-20 | Payer: Self-pay | Admitting: Emergency Medicine

## 2011-01-14 ENCOUNTER — Encounter: Payer: Self-pay | Admitting: Family Medicine

## 2011-01-23 NOTE — Letter (Signed)
Summary: CMN for Supplies / [facility]  CMN for Supplies / [facility]   Imported By: Lennie Odor 09/07/2010 13:54:55  _____________________________________________________________________  External Attachment:    Type:   Image     Comment:   External Document

## 2011-01-23 NOTE — Letter (Signed)
Summary: Historic Patient File  Historic Patient File   Imported By: Lind Guest 11/14/2010 11:17:17  _____________________________________________________________________  External Attachment:    Type:   Image     Comment:   External Document

## 2011-03-06 LAB — BASIC METABOLIC PANEL
Calcium: 9 mg/dL (ref 8.4–10.5)
Chloride: 101 mEq/L (ref 96–112)
Creatinine, Ser: 1.42 mg/dL (ref 0.4–1.5)
GFR calc Af Amer: >60 mL/min (ref >60)
GFR calc non Af Amer: 53 mL/min — ABNORMAL LOW (ref >60)

## 2011-03-06 LAB — URINALYSIS, ROUTINE W REFLEX MICROSCOPIC
Glucose, UA: NEGATIVE mg/dL
Nitrite: NEGATIVE
Protein, ur: NEGATIVE mg/dL
pH: 6 (ref 5.0–8.0)

## 2011-03-06 LAB — DIFFERENTIAL
Basophils Absolute: 0.1 10*3/uL (ref 0.0–0.1)
Lymphocytes Relative: 17 % (ref 12–46)
Lymphs Abs: 2.8 10*3/uL (ref 0.7–4.0)
Neutrophils Relative %: 52 % (ref 43–77)

## 2011-03-06 LAB — ETHANOL: Alcohol, Ethyl (B): <5 mg/dL (ref 0–10)

## 2011-03-06 LAB — CBC
Platelets: 214 10*3/uL (ref 150–400)
RBC: 4.03 MIL/uL — ABNORMAL LOW (ref 4.22–5.81)
WBC: 16.9 10*3/uL — ABNORMAL HIGH (ref 4.0–10.5)

## 2011-03-06 LAB — RAPID URINE DRUG SCREEN, HOSP PERFORMED
Barbiturates: NOT DETECTED
Benzodiazepines: POSITIVE — AB
Cocaine: NOT DETECTED

## 2011-03-07 LAB — CBC
HCT: 34.3 % — ABNORMAL LOW (ref 39.0–52.0)
Hemoglobin: 11.5 g/dL — ABNORMAL LOW (ref 13.0–17.0)
Hemoglobin: 11.6 g/dL — ABNORMAL LOW (ref 13.0–17.0)
MCH: 30.3 pg (ref 26.0–34.0)
MCH: 30.6 pg (ref 26.0–34.0)
MCHC: 33.4 g/dL (ref 30.0–36.0)
MCV: 90.5 fL (ref 78.0–100.0)
MCV: 90.5 fL (ref 78.0–100.0)
Platelets: 211 10*3/uL (ref 150–400)
RBC: 3.79 MIL/uL — ABNORMAL LOW (ref 4.22–5.81)
RBC: 3.79 MIL/uL — ABNORMAL LOW (ref 4.22–5.81)
WBC: 7.8 10*3/uL (ref 4.0–10.5)

## 2011-03-07 LAB — BASIC METABOLIC PANEL
CO2: 33 mEq/L — ABNORMAL HIGH (ref 19–32)
Calcium: 9 mg/dL (ref 8.4–10.5)
Chloride: 96 mEq/L (ref 96–112)
Glucose, Bld: 92 mg/dL (ref 70–99)
Sodium: 137 mEq/L (ref 135–145)

## 2011-03-07 LAB — COMPREHENSIVE METABOLIC PANEL
Albumin: 3.6 g/dL (ref 3.5–5.2)
Alkaline Phosphatase: 52 U/L (ref 39–117)
BUN: 11 mg/dL (ref 6–23)
CO2: 32 mEq/L (ref 19–32)
Chloride: 96 mEq/L (ref 96–112)
Creatinine, Ser: 1.2 mg/dL (ref 0.4–1.5)
GFR calc non Af Amer: >60 mL/min (ref >60)
Glucose, Bld: 115 mg/dL — ABNORMAL HIGH (ref 70–99)
Potassium: 4.1 mEq/L (ref 3.5–5.1)
Total Bilirubin: 0.1 mg/dL — ABNORMAL LOW (ref 0.3–1.2)

## 2011-03-07 LAB — DIFFERENTIAL
Basophils Relative: 0 % (ref 0–1)
Eosinophils Absolute: 0.2 10*3/uL (ref 0.0–0.7)
Eosinophils Absolute: 0.3 10*3/uL (ref 0.0–0.7)
Eosinophils Relative: 5 % (ref 0–5)
Lymphocytes Relative: 23 % (ref 12–46)
Lymphs Abs: 1.6 10*3/uL (ref 0.7–4.0)
Lymphs Abs: 1.8 10*3/uL (ref 0.7–4.0)
Monocytes Absolute: 0.7 10*3/uL (ref 0.1–1.0)
Monocytes Relative: 11 % (ref 3–12)
Monocytes Relative: 8 % (ref 3–12)
Neutro Abs: 5.1 10*3/uL (ref 1.7–7.7)
Neutrophils Relative %: 60 % (ref 43–77)

## 2011-03-07 LAB — POCT CARDIAC MARKERS: Myoglobin, poc: 58.4 ng/mL (ref 12–200)

## 2011-03-08 LAB — GLUCOSE, CAPILLARY
Glucose-Capillary: 152 mg/dL — ABNORMAL HIGH (ref 70–99)
Glucose-Capillary: 171 mg/dL — ABNORMAL HIGH (ref 70–99)

## 2011-03-09 LAB — COMPREHENSIVE METABOLIC PANEL
ALT: 33 U/L (ref 0–53)
AST: 28 U/L (ref 0–37)
Alkaline Phosphatase: 46 U/L (ref 39–117)
CO2: 29 mEq/L (ref 19–32)
Chloride: 101 mEq/L (ref 96–112)
GFR calc Af Amer: >60 mL/min (ref >60)
GFR calc non Af Amer: >60 mL/min (ref >60)
Potassium: 4.1 mEq/L (ref 3.5–5.1)
Sodium: 139 mEq/L (ref 135–145)
Total Bilirubin: 0.4 mg/dL (ref 0.3–1.2)

## 2011-03-09 LAB — POCT I-STAT, CHEM 8
BUN: 18 mg/dL (ref 6–23)
Chloride: 97 mEq/L (ref 96–112)
HCT: 41 % (ref 39.0–52.0)
Potassium: 3.6 mEq/L (ref 3.5–5.1)
Sodium: 138 mEq/L (ref 135–145)

## 2011-03-09 LAB — URINALYSIS, ROUTINE W REFLEX MICROSCOPIC
Bilirubin Urine: NEGATIVE
Glucose, UA: NEGATIVE mg/dL
Glucose, UA: NEGATIVE mg/dL
Hgb urine dipstick: NEGATIVE
Hgb urine dipstick: NEGATIVE
Ketones, ur: NEGATIVE mg/dL
Nitrite: NEGATIVE
Nitrite: NEGATIVE
Specific Gravity, Urine: 1.018 (ref 1.005–1.030)
Specific Gravity, Urine: 1.016 (ref 1.005–1.030)
Specific Gravity, Urine: 1.009 (ref 1.005–1.030)
Urobilinogen, UA: 1 mg/dL (ref 0.0–1.0)
pH: 5.5 (ref 5.0–8.0)
pH: 7.5 (ref 5.0–8.0)

## 2011-03-09 LAB — DIFFERENTIAL
Basophils Absolute: 0 10*3/uL (ref 0.0–0.1)
Basophils Absolute: 0 10*3/uL (ref 0.0–0.1)
Eosinophils Absolute: 0.2 10*3/uL (ref 0.0–0.7)
Eosinophils Relative: 4 % (ref 0–5)
Eosinophils Relative: 5 % (ref 0–5)
Lymphocytes Relative: 31 % (ref 12–46)
Lymphs Abs: 1.9 10*3/uL (ref 0.7–4.0)
Neutro Abs: 3.2 10*3/uL (ref 1.7–7.7)
Neutrophils Relative %: 54 % (ref 43–77)

## 2011-03-09 LAB — BLOOD GAS, ARTERIAL
Acid-Base Excess: 4.6 mmol/L — ABNORMAL HIGH (ref 0.0–2.0)
Bicarbonate: 29.9 mEq/L — ABNORMAL HIGH (ref 20.0–24.0)
Drawn by: 330991
FIO2: 0.4 %
O2 Saturation: 98.6 %
Patient temperature: 98.6
TCO2: 27.7 mmol/L (ref 0–100)
TCO2: 27.7 mmol/L (ref 0–100)
pH, Arterial: 7.336 — ABNORMAL LOW (ref 7.350–7.450)
pO2, Arterial: 126 mmHg — ABNORMAL HIGH (ref 80.0–100.0)

## 2011-03-09 LAB — URINE CULTURE: Culture  Setup Time: 201108201126

## 2011-03-09 LAB — CBC
Hemoglobin: 11.9 g/dL — ABNORMAL LOW (ref 13.0–17.0)
MCH: 30 pg (ref 26.0–34.0)
MCV: 89 fL (ref 78.0–100.0)
Platelets: 147 10*3/uL — ABNORMAL LOW (ref 150–400)
RBC: 3.96 MIL/uL — ABNORMAL LOW (ref 4.22–5.81)
RBC: 4.09 MIL/uL — ABNORMAL LOW (ref 4.22–5.81)
RDW: 14.7 % (ref 11.5–15.5)
WBC: 6 10*3/uL (ref 4.0–10.5)

## 2011-03-09 LAB — GLUCOSE, CAPILLARY
Glucose-Capillary: 108 mg/dL — ABNORMAL HIGH (ref 70–99)
Glucose-Capillary: 163 mg/dL — ABNORMAL HIGH (ref 70–99)
Glucose-Capillary: 86 mg/dL (ref 70–99)
Glucose-Capillary: 95 mg/dL (ref 70–99)

## 2011-03-09 LAB — VALPROIC ACID LEVEL: Valproic Acid Lvl: 130 ug/mL — ABNORMAL HIGH (ref 50.0–100.0)

## 2011-03-11 LAB — COMPREHENSIVE METABOLIC PANEL
ALT: 20 U/L (ref 0–53)
AST: 21 U/L (ref 0–37)
Alkaline Phosphatase: 61 U/L (ref 39–117)
CO2: 30 mEq/L (ref 19–32)
Calcium: 9 mg/dL (ref 8.4–10.5)
GFR calc Af Amer: >60 mL/min (ref >60)
Glucose, Bld: 275 mg/dL — ABNORMAL HIGH (ref 70–99)
Potassium: 4.1 mEq/L (ref 3.5–5.1)
Sodium: 131 mEq/L — ABNORMAL LOW (ref 135–145)
Total Protein: 7 g/dL (ref 6.0–8.3)

## 2011-03-11 LAB — POCT CARDIAC MARKERS: Troponin i, poc: <0.05 ng/mL (ref 0.00–0.09)

## 2011-03-11 LAB — CBC
Hemoglobin: 11.5 g/dL — ABNORMAL LOW (ref 13.0–17.0)
MCHC: 34.1 g/dL (ref 30.0–36.0)
RBC: 3.86 MIL/uL — ABNORMAL LOW (ref 4.22–5.81)

## 2011-03-11 LAB — DIFFERENTIAL
Basophils Relative: 1 % (ref 0–1)
Eosinophils Absolute: 0.4 10*3/uL (ref 0.0–0.7)
Eosinophils Relative: 5 % (ref 0–5)
Lymphs Abs: 1.4 10*3/uL (ref 0.7–4.0)
Monocytes Relative: 9 % (ref 3–12)

## 2011-03-13 LAB — COMPREHENSIVE METABOLIC PANEL
ALT: 23 U/L (ref 0–53)
AST: 22 U/L (ref 0–37)
Albumin: 3.5 g/dL (ref 3.5–5.2)
Calcium: 9 mg/dL (ref 8.4–10.5)
Creatinine, Ser: 1.17 mg/dL (ref 0.4–1.5)
GFR calc Af Amer: >60 mL/min (ref >60)
Sodium: 139 mEq/L (ref 135–145)
Total Protein: 7.1 g/dL (ref 6.0–8.3)

## 2011-03-13 LAB — URINALYSIS, ROUTINE W REFLEX MICROSCOPIC
Glucose, UA: NEGATIVE mg/dL
Nitrite: NEGATIVE
Specific Gravity, Urine: 1.012 (ref 1.005–1.030)
pH: 5.5 (ref 5.0–8.0)

## 2011-03-13 LAB — DIFFERENTIAL
Basophils Relative: 0 % (ref 0–1)
Lymphocytes Relative: 24 % (ref 12–46)
Monocytes Relative: 11 % (ref 3–12)
Neutro Abs: 3.5 10*3/uL (ref 1.7–7.7)
Neutrophils Relative %: 59 % (ref 43–77)

## 2011-03-13 LAB — CBC
MCHC: 32.8 g/dL (ref 30.0–36.0)
Platelets: 199 10*3/uL (ref 150–400)
RBC: 3.99 MIL/uL — ABNORMAL LOW (ref 4.22–5.81)
RDW: 15.6 % — ABNORMAL HIGH (ref 11.5–15.5)

## 2011-03-13 LAB — GLUCOSE, CAPILLARY: Glucose-Capillary: 168 mg/dL — ABNORMAL HIGH (ref 70–99)

## 2011-03-13 LAB — AMMONIA: Ammonia: 48 umol/L — ABNORMAL HIGH (ref 11–35)

## 2011-03-18 LAB — GLUCOSE, CAPILLARY: Glucose-Capillary: 121 mg/dL — ABNORMAL HIGH (ref 70–99)

## 2011-03-28 LAB — GLUCOSE, CAPILLARY
Glucose-Capillary: 188 mg/dL — ABNORMAL HIGH (ref 70–99)
Glucose-Capillary: 188 mg/dL — ABNORMAL HIGH (ref 70–99)
Glucose-Capillary: 189 mg/dL — ABNORMAL HIGH (ref 70–99)
Glucose-Capillary: 215 mg/dL — ABNORMAL HIGH (ref 70–99)
Glucose-Capillary: 240 mg/dL — ABNORMAL HIGH (ref 70–99)
Glucose-Capillary: 247 mg/dL — ABNORMAL HIGH (ref 70–99)
Glucose-Capillary: 290 mg/dL — ABNORMAL HIGH (ref 70–99)
Glucose-Capillary: 291 mg/dL — ABNORMAL HIGH (ref 70–99)
Glucose-Capillary: 295 mg/dL — ABNORMAL HIGH (ref 70–99)
Glucose-Capillary: 340 mg/dL — ABNORMAL HIGH (ref 70–99)
Glucose-Capillary: 343 mg/dL — ABNORMAL HIGH (ref 70–99)
Glucose-Capillary: 284 mg/dL — ABNORMAL HIGH (ref 70–99)

## 2011-03-28 LAB — URINALYSIS, ROUTINE W REFLEX MICROSCOPIC
Nitrite: NEGATIVE
Specific Gravity, Urine: 1.027 (ref 1.005–1.030)
Urobilinogen, UA: 1 mg/dL (ref 0.0–1.0)

## 2011-03-28 LAB — CBC
HCT: 34.2 % — ABNORMAL LOW (ref 39.0–52.0)
Hemoglobin: 11.6 g/dL — ABNORMAL LOW (ref 13.0–17.0)
Hemoglobin: 12.4 g/dL — ABNORMAL LOW (ref 13.0–17.0)
MCHC: 32.7 g/dL (ref 30.0–36.0)
MCHC: 33.3 g/dL (ref 30.0–36.0)
MCHC: 33.3 g/dL (ref 30.0–36.0)
MCHC: 33.6 g/dL (ref 30.0–36.0)
MCV: 86.6 fL (ref 78.0–100.0)
MCV: 87.6 fL (ref 78.0–100.0)
MCV: 88 fL (ref 78.0–100.0)
MCV: 88.1 fL (ref 78.0–100.0)
Platelets: 137 10*3/uL — ABNORMAL LOW (ref 150–400)
Platelets: 144 10*3/uL — ABNORMAL LOW (ref 150–400)
RBC: 3.41 MIL/uL — ABNORMAL LOW (ref 4.22–5.81)
RBC: 3.95 MIL/uL — ABNORMAL LOW (ref 4.22–5.81)
RBC: 4.07 MIL/uL — ABNORMAL LOW (ref 4.22–5.81)
RBC: 4.08 MIL/uL — ABNORMAL LOW (ref 4.22–5.81)
RBC: 4.27 MIL/uL (ref 4.22–5.81)
RDW: 15.2 % (ref 11.5–15.5)
RDW: 15.7 % — ABNORMAL HIGH (ref 11.5–15.5)
WBC: 11.4 10*3/uL — ABNORMAL HIGH (ref 4.0–10.5)
WBC: 12.2 10*3/uL — ABNORMAL HIGH (ref 4.0–10.5)
WBC: 7.5 10*3/uL (ref 4.0–10.5)

## 2011-03-28 LAB — DIFFERENTIAL
Basophils Relative: 0 % (ref 0–1)
Basophils Relative: 0 % (ref 0–1)
Basophils Relative: 1 % (ref 0–1)
Eosinophils Absolute: 0 10*3/uL (ref 0.0–0.7)
Eosinophils Absolute: 0.3 10*3/uL (ref 0.0–0.7)
Eosinophils Relative: 2 % (ref 0–5)
Lymphs Abs: 0.9 10*3/uL (ref 0.7–4.0)
Monocytes Absolute: 0.9 10*3/uL (ref 0.1–1.0)
Monocytes Absolute: 0.9 10*3/uL (ref 0.1–1.0)
Monocytes Relative: 13 % — ABNORMAL HIGH (ref 3–12)
Monocytes Relative: 6 % (ref 3–12)
Monocytes Relative: 8 % (ref 3–12)
Neutro Abs: 10.6 10*3/uL — ABNORMAL HIGH (ref 1.7–7.7)
Neutro Abs: 9.4 10*3/uL — ABNORMAL HIGH (ref 1.7–7.7)
Neutrophils Relative %: 86 % — ABNORMAL HIGH (ref 43–77)

## 2011-03-28 LAB — COMPREHENSIVE METABOLIC PANEL
ALT: 18 U/L (ref 0–53)
AST: 23 U/L (ref 0–37)
Albumin: 2.8 g/dL — ABNORMAL LOW (ref 3.5–5.2)
Alkaline Phosphatase: 55 U/L (ref 39–117)
Alkaline Phosphatase: 56 U/L (ref 39–117)
BUN: 19 mg/dL (ref 6–23)
CO2: 28 mEq/L (ref 19–32)
Chloride: 98 mEq/L (ref 96–112)
Chloride: 99 mEq/L (ref 96–112)
GFR calc Af Amer: >60 mL/min (ref >60)
GFR calc non Af Amer: >60 mL/min (ref >60)
Glucose, Bld: 194 mg/dL — ABNORMAL HIGH (ref 70–99)
Potassium: 3.9 mEq/L (ref 3.5–5.1)
Potassium: 4.3 mEq/L (ref 3.5–5.1)
Sodium: 135 mEq/L (ref 135–145)
Sodium: 135 mEq/L (ref 135–145)
Total Bilirubin: 0.5 mg/dL (ref 0.3–1.2)
Total Bilirubin: 0.7 mg/dL (ref 0.3–1.2)
Total Protein: 6.5 g/dL (ref 6.0–8.3)
Total Protein: 6.8 g/dL (ref 6.0–8.3)

## 2011-03-28 LAB — T4, FREE: Free T4: 0.88 ng/dL (ref 0.80–1.80)

## 2011-03-28 LAB — BASIC METABOLIC PANEL
BUN: 12 mg/dL (ref 6–23)
CO2: 28 mEq/L (ref 19–32)
CO2: 28 mEq/L (ref 19–32)
CO2: 30 mEq/L (ref 19–32)
Calcium: 7.9 mg/dL — ABNORMAL LOW (ref 8.4–10.5)
Calcium: 8.7 mg/dL (ref 8.4–10.5)
Chloride: 100 mEq/L (ref 96–112)
Chloride: 97 mEq/L (ref 96–112)
Creatinine, Ser: 0.91 mg/dL (ref 0.4–1.5)
Creatinine, Ser: 1.03 mg/dL (ref 0.4–1.5)
Creatinine, Ser: 1.08 mg/dL (ref 0.4–1.5)
GFR calc Af Amer: >60 mL/min (ref >60)
GFR calc Af Amer: >60 mL/min (ref >60)
GFR calc non Af Amer: >60 mL/min (ref >60)
Glucose, Bld: 203 mg/dL — ABNORMAL HIGH (ref 70–99)
Glucose, Bld: 338 mg/dL — ABNORMAL HIGH (ref 70–99)
Sodium: 137 mEq/L (ref 135–145)

## 2011-03-28 LAB — LIPID PANEL
Cholesterol: 137 mg/dL (ref 0–200)
HDL: 48 mg/dL (ref >39)
LDL Cholesterol: 52 mg/dL (ref 0–99)
Total CHOL/HDL Ratio: 2.9 RATIO
Triglycerides: 183 mg/dL — ABNORMAL HIGH (ref <150)

## 2011-03-28 LAB — POCT I-STAT, CHEM 8
Calcium, Ion: 1.17 mmol/L (ref 1.12–1.32)
Chloride: 96 mEq/L (ref 96–112)
Creatinine, Ser: 1.5 mg/dL (ref 0.4–1.5)
Glucose, Bld: 360 mg/dL — ABNORMAL HIGH (ref 70–99)
HCT: 39 % (ref 39.0–52.0)
Potassium: 3.6 mEq/L (ref 3.5–5.1)

## 2011-03-28 LAB — IRON AND TIBC: UIBC: 242 ug/dL

## 2011-03-28 LAB — TRICYCLICS SCREEN, URINE: TCA Scrn: NOT DETECTED

## 2011-03-28 LAB — VITAMIN B12: Vitamin B-12: 450 pg/mL (ref 211–911)

## 2011-03-28 LAB — CK: Total CK: 101 U/L (ref 7–232)

## 2011-03-28 LAB — D-DIMER, QUANTITATIVE: D-Dimer, Quant: 0.54 ug/mL-FEU — ABNORMAL HIGH (ref 0.00–0.48)

## 2011-03-28 LAB — CULTURE, BLOOD (ROUTINE X 2)
Culture: NO GROWTH
Culture: NO GROWTH

## 2011-03-28 LAB — FOLATE: Folate: 11.6 ng/mL

## 2011-03-28 LAB — RETICULOCYTES: Retic Count, Absolute: 37.9 10*3/uL (ref 19.0–186.0)

## 2011-03-28 LAB — VANCOMYCIN, TROUGH: Vancomycin Tr: 13.3 ug/mL (ref 10.0–20.0)

## 2011-03-28 LAB — SEDIMENTATION RATE: Sed Rate: 34 mm/hr — ABNORMAL HIGH (ref 0–16)

## 2011-03-28 LAB — RAPID URINE DRUG SCREEN, HOSP PERFORMED: Tetrahydrocannabinol: NOT DETECTED

## 2011-03-28 LAB — HEMOGLOBIN A1C: Mean Plasma Glucose: 235 mg/dL

## 2011-03-29 LAB — BASIC METABOLIC PANEL
CO2: 29 mEq/L (ref 19–32)
CO2: 30 mEq/L (ref 19–32)
Calcium: 9.5 mg/dL (ref 8.4–10.5)
Calcium: 9.7 mg/dL (ref 8.4–10.5)
Chloride: 97 mEq/L (ref 96–112)
Creatinine, Ser: 0.89 mg/dL (ref 0.4–1.5)
GFR calc Af Amer: >60 mL/min (ref >60)
Glucose, Bld: 209 mg/dL — ABNORMAL HIGH (ref 70–99)
Sodium: 137 mEq/L (ref 135–145)

## 2011-03-29 LAB — POCT I-STAT, CHEM 8
BUN: 19 mg/dL (ref 6–23)
Calcium, Ion: 1.14 mmol/L (ref 1.12–1.32)
Creatinine, Ser: 0.9 mg/dL (ref 0.4–1.5)
Glucose, Bld: 227 mg/dL — ABNORMAL HIGH (ref 70–99)
Hemoglobin: 13.9 g/dL (ref 13.0–17.0)
TCO2: 31 mmol/L (ref 0–100)

## 2011-03-29 LAB — CBC
HCT: 36.1 % — ABNORMAL LOW (ref 39.0–52.0)
Hemoglobin: 12.1 g/dL — ABNORMAL LOW (ref 13.0–17.0)
Hemoglobin: 12.4 g/dL — ABNORMAL LOW (ref 13.0–17.0)
Hemoglobin: 12.6 g/dL — ABNORMAL LOW (ref 13.0–17.0)
MCHC: 33.7 g/dL (ref 30.0–36.0)
MCHC: 33.7 g/dL (ref 30.0–36.0)
MCV: 87.3 fL (ref 78.0–100.0)
MCV: 88.2 fL (ref 78.0–100.0)
RBC: 4.23 MIL/uL (ref 4.22–5.81)
RDW: 15.6 % — ABNORMAL HIGH (ref 11.5–15.5)
WBC: 7 10*3/uL (ref 4.0–10.5)
WBC: 7.3 10*3/uL (ref 4.0–10.5)

## 2011-03-29 LAB — GLUCOSE, CAPILLARY
Glucose-Capillary: 247 mg/dL — ABNORMAL HIGH (ref 70–99)
Glucose-Capillary: 277 mg/dL — ABNORMAL HIGH (ref 70–99)
Glucose-Capillary: 329 mg/dL — ABNORMAL HIGH (ref 70–99)
Glucose-Capillary: 344 mg/dL — ABNORMAL HIGH (ref 70–99)
Glucose-Capillary: 370 mg/dL — ABNORMAL HIGH (ref 70–99)
Glucose-Capillary: 437 mg/dL — ABNORMAL HIGH (ref 70–99)

## 2011-03-29 LAB — URINALYSIS, ROUTINE W REFLEX MICROSCOPIC
Glucose, UA: 250 mg/dL — AB
Hgb urine dipstick: NEGATIVE
Specific Gravity, Urine: 1.01 (ref 1.005–1.030)
pH: 6.5 (ref 5.0–8.0)

## 2011-03-29 LAB — DIFFERENTIAL
Basophils Absolute: 0 10*3/uL (ref 0.0–0.1)
Basophils Relative: 0 % (ref 0–1)
Basophils Relative: 0 % (ref 0–1)
Eosinophils Absolute: 0.2 10*3/uL (ref 0.0–0.7)
Eosinophils Absolute: 0.3 10*3/uL (ref 0.0–0.7)
Eosinophils Relative: 4 % (ref 0–5)
Lymphocytes Relative: 20 % (ref 12–46)
Lymphocytes Relative: 27 % (ref 12–46)
Lymphs Abs: 1.5 10*3/uL (ref 0.7–4.0)
Lymphs Abs: 1.9 10*3/uL (ref 0.7–4.0)
Monocytes Absolute: 0.5 10*3/uL (ref 0.1–1.0)
Monocytes Absolute: 0.6 10*3/uL (ref 0.1–1.0)
Monocytes Absolute: 0.7 10*3/uL (ref 0.1–1.0)
Monocytes Relative: 7 % (ref 3–12)
Monocytes Relative: 9 % (ref 3–12)
Neutro Abs: 5.1 10*3/uL (ref 1.7–7.7)
Neutro Abs: 5.4 10*3/uL (ref 1.7–7.7)
Neutrophils Relative %: 73 % (ref 43–77)

## 2011-03-29 LAB — RAPID URINE DRUG SCREEN, HOSP PERFORMED
Amphetamines: NOT DETECTED
Amphetamines: NOT DETECTED
Barbiturates: NOT DETECTED
Barbiturates: NOT DETECTED
Benzodiazepines: NOT DETECTED
Opiates: NOT DETECTED

## 2011-03-31 LAB — BASIC METABOLIC PANEL
Chloride: 96 mEq/L (ref 96–112)
Creatinine, Ser: 1.07 mg/dL (ref 0.4–1.5)
GFR calc Af Amer: >60 mL/min (ref >60)
Potassium: 3.9 mEq/L (ref 3.5–5.1)
Sodium: 134 mEq/L — ABNORMAL LOW (ref 135–145)

## 2011-03-31 LAB — CBC
HCT: 33.4 % — ABNORMAL LOW (ref 39.0–52.0)
Hemoglobin: 11.3 g/dL — ABNORMAL LOW (ref 13.0–17.0)
MCV: 85.4 fL (ref 78.0–100.0)
RBC: 3.91 MIL/uL — ABNORMAL LOW (ref 4.22–5.81)
WBC: 6.2 10*3/uL (ref 4.0–10.5)

## 2011-03-31 LAB — DIFFERENTIAL
Eosinophils Absolute: 0.4 10*3/uL (ref 0.0–0.7)
Lymphs Abs: 1.9 10*3/uL (ref 0.7–4.0)
Monocytes Absolute: 0.6 10*3/uL (ref 0.1–1.0)
Monocytes Relative: 10 % (ref 3–12)
Neutrophils Relative %: 52 % (ref 43–77)

## 2011-03-31 LAB — RAPID URINE DRUG SCREEN, HOSP PERFORMED
Amphetamines: NOT DETECTED
Opiates: NOT DETECTED
Tetrahydrocannabinol: NOT DETECTED

## 2011-03-31 LAB — ETHANOL: Alcohol, Ethyl (B): <5 mg/dL (ref 0–10)

## 2011-03-31 LAB — GLUCOSE, CAPILLARY: Glucose-Capillary: 303 mg/dL — ABNORMAL HIGH (ref 70–99)

## 2011-04-01 LAB — COMPREHENSIVE METABOLIC PANEL
Albumin: 2.4 g/dL — ABNORMAL LOW (ref 3.5–5.2)
Alkaline Phosphatase: 86 U/L (ref 39–117)
BUN: 10 mg/dL (ref 6–23)
BUN: 6 mg/dL (ref 6–23)
Calcium: 8.8 mg/dL (ref 8.4–10.5)
Chloride: 100 mEq/L (ref 96–112)
Creatinine, Ser: 0.83 mg/dL (ref 0.4–1.5)
Creatinine, Ser: 0.93 mg/dL (ref 0.4–1.5)
GFR calc non Af Amer: >60 mL/min (ref >60)
Glucose, Bld: 206 mg/dL — ABNORMAL HIGH (ref 70–99)
Glucose, Bld: 244 mg/dL — ABNORMAL HIGH (ref 70–99)
Total Bilirubin: 0.6 mg/dL (ref 0.3–1.2)
Total Protein: 6.5 g/dL (ref 6.0–8.3)

## 2011-04-01 LAB — GLUCOSE, CAPILLARY
Glucose-Capillary: 150 mg/dL — ABNORMAL HIGH (ref 70–99)
Glucose-Capillary: 151 mg/dL — ABNORMAL HIGH (ref 70–99)
Glucose-Capillary: 205 mg/dL — ABNORMAL HIGH (ref 70–99)
Glucose-Capillary: 210 mg/dL — ABNORMAL HIGH (ref 70–99)
Glucose-Capillary: 251 mg/dL — ABNORMAL HIGH (ref 70–99)
Glucose-Capillary: 327 mg/dL — ABNORMAL HIGH (ref 70–99)
Glucose-Capillary: 338 mg/dL — ABNORMAL HIGH (ref 70–99)
Glucose-Capillary: 105 mg/dL — ABNORMAL HIGH (ref 70–99)
Glucose-Capillary: 127 mg/dL — ABNORMAL HIGH (ref 70–99)
Glucose-Capillary: 143 mg/dL — ABNORMAL HIGH (ref 70–99)
Glucose-Capillary: 144 mg/dL — ABNORMAL HIGH (ref 70–99)
Glucose-Capillary: 158 mg/dL — ABNORMAL HIGH (ref 70–99)
Glucose-Capillary: 207 mg/dL — ABNORMAL HIGH (ref 70–99)
Glucose-Capillary: 234 mg/dL — ABNORMAL HIGH (ref 70–99)
Glucose-Capillary: 279 mg/dL — ABNORMAL HIGH (ref 70–99)
Glucose-Capillary: 246 mg/dL — ABNORMAL HIGH (ref 70–99)
Glucose-Capillary: 356 mg/dL — ABNORMAL HIGH (ref 70–99)

## 2011-04-01 LAB — IRON AND TIBC: TIBC: 256 ug/dL (ref 215–435)

## 2011-04-01 LAB — URINALYSIS, ROUTINE W REFLEX MICROSCOPIC
Ketones, ur: NEGATIVE mg/dL
Nitrite: NEGATIVE
Specific Gravity, Urine: 1.015 (ref 1.005–1.030)
Urobilinogen, UA: 1 mg/dL (ref 0.0–1.0)
pH: 6.5 (ref 5.0–8.0)

## 2011-04-01 LAB — DIFFERENTIAL
Basophils Absolute: 0 10*3/uL (ref 0.0–0.1)
Basophils Absolute: 0 10*3/uL (ref 0.0–0.1)
Basophils Absolute: 0 10*3/uL (ref 0.0–0.1)
Basophils Absolute: 0 10*3/uL (ref 0.0–0.1)
Basophils Absolute: 0 10*3/uL (ref 0.0–0.1)
Basophils Relative: 0 % (ref 0–1)
Basophils Relative: 0 % (ref 0–1)
Basophils Relative: 1 % (ref 0–1)
Basophils Relative: 0 % (ref 0–1)
Basophils Relative: 0 % (ref 0–1)
Eosinophils Absolute: 0.4 10*3/uL (ref 0.0–0.7)
Eosinophils Absolute: 0.5 10*3/uL (ref 0.0–0.7)
Eosinophils Absolute: 0.3 10*3/uL (ref 0.0–0.7)
Eosinophils Absolute: 0 10*3/uL (ref 0.0–0.7)
Eosinophils Absolute: 0.4 10*3/uL (ref 0.0–0.7)
Eosinophils Relative: 4 % (ref 0–5)
Eosinophils Relative: 4 % (ref 0–5)
Lymphocytes Relative: 10 % — ABNORMAL LOW (ref 12–46)
Lymphs Abs: 1.5 10*3/uL (ref 0.7–4.0)
Lymphs Abs: 0.6 10*3/uL — ABNORMAL LOW (ref 0.7–4.0)
Lymphs Abs: 1 10*3/uL (ref 0.7–4.0)
Monocytes Absolute: 1.2 10*3/uL — ABNORMAL HIGH (ref 0.1–1.0)
Monocytes Absolute: 1.1 10*3/uL — ABNORMAL HIGH (ref 0.1–1.0)
Monocytes Relative: 10 % (ref 3–12)
Monocytes Relative: 10 % (ref 3–12)
Monocytes Relative: 10 % (ref 3–12)
Monocytes Relative: 11 % (ref 3–12)
Neutro Abs: 7.9 10*3/uL — ABNORMAL HIGH (ref 1.7–7.7)
Neutro Abs: 8.2 10*3/uL — ABNORMAL HIGH (ref 1.7–7.7)
Neutro Abs: 7.9 10*3/uL — ABNORMAL HIGH (ref 1.7–7.7)
Neutrophils Relative %: 71 % (ref 43–77)
Neutrophils Relative %: 73 % (ref 43–77)
Neutrophils Relative %: 71 % (ref 43–77)
Neutrophils Relative %: 76 % (ref 43–77)
Neutrophils Relative %: 88 % — ABNORMAL HIGH (ref 43–77)

## 2011-04-01 LAB — CBC
HCT: 30.1 % — ABNORMAL LOW (ref 39.0–52.0)
HCT: 31.2 % — ABNORMAL LOW (ref 39.0–52.0)
HCT: 32.2 % — ABNORMAL LOW (ref 39.0–52.0)
HCT: 32.5 % — ABNORMAL LOW (ref 39.0–52.0)
Hemoglobin: 10.4 g/dL — ABNORMAL LOW (ref 13.0–17.0)
Hemoglobin: 10.8 g/dL — ABNORMAL LOW (ref 13.0–17.0)
Hemoglobin: 11.1 g/dL — ABNORMAL LOW (ref 13.0–17.0)
MCHC: 34.4 g/dL (ref 30.0–36.0)
MCHC: 34.5 g/dL (ref 30.0–36.0)
MCHC: 33.2 g/dL (ref 30.0–36.0)
MCHC: 33.7 g/dL (ref 30.0–36.0)
MCHC: 34.7 g/dL (ref 30.0–36.0)
MCV: 86 fL (ref 78.0–100.0)
MCV: 86.1 fL (ref 78.0–100.0)
MCV: 86.8 fL (ref 78.0–100.0)
MCV: 86.8 fL (ref 78.0–100.0)
MCV: 86.8 fL (ref 78.0–100.0)
Platelets: 274 10*3/uL (ref 150–400)
Platelets: 384 10*3/uL (ref 150–400)
Platelets: 109 10*3/uL — ABNORMAL LOW (ref 150–400)
RBC: 3.74 MIL/uL — ABNORMAL LOW (ref 4.22–5.81)
RBC: 3.78 MIL/uL — ABNORMAL LOW (ref 4.22–5.81)
RBC: 3.83 MIL/uL — ABNORMAL LOW (ref 4.22–5.81)
RBC: 3.59 MIL/uL — ABNORMAL LOW (ref 4.22–5.81)
RDW: 14.9 % (ref 11.5–15.5)
RDW: 15.3 % (ref 11.5–15.5)
RDW: 15.5 % (ref 11.5–15.5)
WBC: 11.1 10*3/uL — ABNORMAL HIGH (ref 4.0–10.5)
WBC: 10 10*3/uL (ref 4.0–10.5)
WBC: 8.1 10*3/uL (ref 4.0–10.5)
WBC: 10.8 10*3/uL — ABNORMAL HIGH (ref 4.0–10.5)

## 2011-04-01 LAB — BASIC METABOLIC PANEL
BUN: 8 mg/dL (ref 6–23)
BUN: 15 mg/dL (ref 6–23)
BUN: 20 mg/dL (ref 6–23)
CO2: 29 mEq/L (ref 19–32)
CO2: 33 mEq/L — ABNORMAL HIGH (ref 19–32)
CO2: 28 mEq/L (ref 19–32)
CO2: 30 mEq/L (ref 19–32)
CO2: 27 mEq/L (ref 19–32)
CO2: 30 mEq/L (ref 19–32)
Calcium: 9.2 mg/dL (ref 8.4–10.5)
Calcium: 9.2 mg/dL (ref 8.4–10.5)
Calcium: 8.1 mg/dL — ABNORMAL LOW (ref 8.4–10.5)
Calcium: 8.3 mg/dL — ABNORMAL LOW (ref 8.4–10.5)
Chloride: 101 mEq/L (ref 96–112)
Chloride: 103 mEq/L (ref 96–112)
Chloride: 96 mEq/L (ref 96–112)
Chloride: 97 mEq/L (ref 96–112)
Creatinine, Ser: 0.94 mg/dL (ref 0.4–1.5)
Creatinine, Ser: 1.01 mg/dL (ref 0.4–1.5)
Creatinine, Ser: 1.14 mg/dL (ref 0.4–1.5)
Creatinine, Ser: 1.3 mg/dL (ref 0.4–1.5)
Creatinine, Ser: 1.51 mg/dL — ABNORMAL HIGH (ref 0.4–1.5)
GFR calc Af Amer: >60 mL/min (ref >60)
GFR calc Af Amer: >60 mL/min (ref >60)
GFR calc Af Amer: >60 mL/min (ref >60)
GFR calc Af Amer: 60 mL/min — ABNORMAL LOW (ref >60)
GFR calc Af Amer: >60 mL/min (ref >60)
GFR calc non Af Amer: >60 mL/min (ref >60)
GFR calc non Af Amer: 50 mL/min — ABNORMAL LOW (ref >60)
GFR calc non Af Amer: 59 mL/min — ABNORMAL LOW (ref >60)
Glucose, Bld: 110 mg/dL — ABNORMAL HIGH (ref 70–99)
Glucose, Bld: 193 mg/dL — ABNORMAL HIGH (ref 70–99)
Glucose, Bld: 192 mg/dL — ABNORMAL HIGH (ref 70–99)
Glucose, Bld: 179 mg/dL — ABNORMAL HIGH (ref 70–99)
Potassium: 3.7 mEq/L (ref 3.5–5.1)
Potassium: 4 mEq/L (ref 3.5–5.1)
Potassium: 4.3 mEq/L (ref 3.5–5.1)
Potassium: 3.4 mEq/L — ABNORMAL LOW (ref 3.5–5.1)
Sodium: 138 mEq/L (ref 135–145)
Sodium: 141 mEq/L (ref 135–145)
Sodium: 134 mEq/L — ABNORMAL LOW (ref 135–145)

## 2011-04-01 LAB — D-DIMER, QUANTITATIVE: D-Dimer, Quant: 0.36 ug/mL-FEU (ref 0.00–0.48)

## 2011-04-01 LAB — CULTURE, BLOOD (ROUTINE X 2)
Culture: NO GROWTH
Report Status: 7172010

## 2011-04-01 LAB — HIV ANTIBODY (ROUTINE TESTING W REFLEX): HIV: NONREACTIVE

## 2011-04-01 LAB — SEDIMENTATION RATE: Sed Rate: 68 mm/hr — ABNORMAL HIGH (ref 0–16)

## 2011-04-01 LAB — HEMOGLOBIN A1C: Mean Plasma Glucose: 237 mg/dL

## 2011-04-01 LAB — RETICULOCYTES: RBC.: 3.8 MIL/uL — ABNORMAL LOW (ref 4.22–5.81)

## 2011-04-01 LAB — VANCOMYCIN, TROUGH
Vancomycin Tr: 29.8 ug/mL (ref 10.0–20.0)
Vancomycin Tr: <5 ug/mL — ABNORMAL LOW (ref 10.0–20.0)

## 2011-04-01 LAB — CARDIAC PANEL(CRET KIN+CKTOT+MB+TROPI): Relative Index: 0.3 (ref 0.0–2.5)

## 2011-04-01 LAB — HEPATITIS PANEL, ACUTE
HCV Ab: NEGATIVE
Hep A IgM: NEGATIVE
Hep B C IgM: NEGATIVE

## 2011-04-01 LAB — FOLATE: Folate: 14.8 ng/mL

## 2011-04-01 LAB — C-REACTIVE PROTEIN: CRP: 13 mg/dL — ABNORMAL HIGH (ref <0.6)

## 2011-04-02 LAB — BASIC METABOLIC PANEL
Calcium: 9.5 mg/dL (ref 8.4–10.5)
Creatinine, Ser: 1.1 mg/dL (ref 0.4–1.5)
GFR calc Af Amer: >60 mL/min (ref >60)
GFR calc non Af Amer: >60 mL/min (ref >60)
Sodium: 132 mEq/L — ABNORMAL LOW (ref 135–145)

## 2011-04-02 LAB — CBC
Hemoglobin: 13.2 g/dL (ref 13.0–17.0)
RBC: 4.25 MIL/uL (ref 4.22–5.81)
RDW: 14 % (ref 11.5–15.5)

## 2011-04-02 LAB — GLUCOSE, CAPILLARY
Glucose-Capillary: 183 mg/dL — ABNORMAL HIGH (ref 70–99)
Glucose-Capillary: 192 mg/dL — ABNORMAL HIGH (ref 70–99)
Glucose-Capillary: 195 mg/dL — ABNORMAL HIGH (ref 70–99)
Glucose-Capillary: 208 mg/dL — ABNORMAL HIGH (ref 70–99)
Glucose-Capillary: 223 mg/dL — ABNORMAL HIGH (ref 70–99)
Glucose-Capillary: 224 mg/dL — ABNORMAL HIGH (ref 70–99)
Glucose-Capillary: 279 mg/dL — ABNORMAL HIGH (ref 70–99)
Glucose-Capillary: 286 mg/dL — ABNORMAL HIGH (ref 70–99)
Glucose-Capillary: 328 mg/dL — ABNORMAL HIGH (ref 70–99)
Glucose-Capillary: 354 mg/dL — ABNORMAL HIGH (ref 70–99)
Glucose-Capillary: 367 mg/dL — ABNORMAL HIGH (ref 70–99)
Glucose-Capillary: 376 mg/dL — ABNORMAL HIGH (ref 70–99)

## 2011-04-02 LAB — RAPID URINE DRUG SCREEN, HOSP PERFORMED
Amphetamines: NOT DETECTED
Tetrahydrocannabinol: NOT DETECTED

## 2011-04-02 LAB — URINALYSIS, ROUTINE W REFLEX MICROSCOPIC
Bilirubin Urine: NEGATIVE
Hgb urine dipstick: NEGATIVE
Ketones, ur: NEGATIVE mg/dL
Protein, ur: NEGATIVE mg/dL
Urobilinogen, UA: 0.2 mg/dL (ref 0.0–1.0)

## 2011-04-02 LAB — DIFFERENTIAL
Basophils Absolute: 0 10*3/uL (ref 0.0–0.1)
Lymphocytes Relative: 23 % (ref 12–46)
Monocytes Absolute: 0.8 10*3/uL (ref 0.1–1.0)
Monocytes Relative: 10 % (ref 3–12)
Neutro Abs: 4.9 10*3/uL (ref 1.7–7.7)
Neutrophils Relative %: 64 % (ref 43–77)

## 2011-04-10 LAB — CBC
HCT: 42.5 % (ref 39.0–52.0)
Hemoglobin: 14.1 g/dL (ref 13.0–17.0)
MCHC: 33.3 g/dL (ref 30.0–36.0)
MCV: 88.7 fL (ref 78.0–100.0)
RDW: 14.3 % (ref 11.5–15.5)

## 2011-04-10 LAB — DIFFERENTIAL
Basophils Absolute: 0 10*3/uL (ref 0.0–0.1)
Basophils Relative: 0 % (ref 0–1)
Eosinophils Absolute: 0.1 10*3/uL (ref 0.0–0.7)
Eosinophils Relative: 2 % (ref 0–5)
Monocytes Absolute: 0.5 10*3/uL (ref 0.1–1.0)
Monocytes Relative: 7 % (ref 3–12)

## 2011-04-10 LAB — BASIC METABOLIC PANEL
CO2: 27 mEq/L (ref 19–32)
Calcium: 9.8 mg/dL (ref 8.4–10.5)
Chloride: 99 mEq/L (ref 96–112)
Glucose, Bld: 120 mg/dL — ABNORMAL HIGH (ref 70–99)
Sodium: 137 mEq/L (ref 135–145)

## 2011-04-10 LAB — RAPID URINE DRUG SCREEN, HOSP PERFORMED
Amphetamines: NOT DETECTED
Barbiturates: NOT DETECTED
Opiates: POSITIVE — AB

## 2011-05-08 NOTE — Group Therapy Note (Signed)
[name], [name]           ACCOUNT NO.:  [id number]   MEDICAL RECORD NO.:  [id number]          PATIENT TYPE:  INP   LOCATION:  A336                          FACILITY:  [facility]   PHYSICIAN:  Edward L. Juanetta Gosling, M.D.DATE OF BIRTH:  1961/07/25   DATE OF PROCEDURE:  DATE OF DISCHARGE:                                 PROGRESS NOTE   Mr. [name] has come in with pneumonia.  He also has diabetes and  hypertension, but seems to be doing a little bit better.  He has no new  complaints.  He is still having a headache and I suspect some of that is  because he is still having fever.  His temperature is 101.0, pulse 107,  respirations 22, blood pressure 148/93, and O2 sats 91%.  He is on  sliding scale by protocol.  His chest shows some rhonchi bilaterally.  His heart is regular.  His blood cultures at 1 day are no growth.  He  was in the emergency room earlier this week and because of his severe  headache, he had a spinal tap done and that so far has not grown  anything.  Cell count on that was 25 red cells, rare segmented polyp,  but on another tube, no red cells, essentially no white cells.  His  white blood count this morning is 12,400.   ASSESSMENT:  He has got pneumonia.  I think he is not any better yet.   PLAN:  To continue with his treatments and follow.      Edward L. Juanetta Gosling, M.D.  Electronically Signed     ELH/MEDQ  D:  09/19/2008  T:  09/19/2008  Job:  [id number]

## 2011-05-08 NOTE — H&P (Signed)
NAMEJULEN, [name]           ACCOUNT NO.:  [id number]   MEDICAL RECORD NO.:  [id number]          PATIENT TYPE:  INP   LOCATION:  A336                          FACILITY:  [facility]   PHYSICIAN:  Catalina Pizza, M.D.        DATE OF BIRTH:  [birth year]   DATE OF ADMISSION:  09/18/2008  DATE OF DISCHARGE:  LH                              HISTORY & PHYSICAL   PRIMARY DOCTOR:  Oneal Deputy. Juanetta Gosling, MD   CHIEF COMPLAINT:  Fever, weakness, nausea.   HISTORY OF PRESENT ILLNESS:  [name] is a 50 year old African  American gentleman who is in usual state of health up until Thursday  when he came into the emergency department at that time for severe  headache and generalized malaise.  Apparently, had a spinal tap that  time which did not reveal anything and felt this was a viral illness,  was sent home.  Following day had a fever and progressively felt worse  and started to have a cough at that time.  Initially, nonproductive,  fever continued increase up to 105, and brought into the emergency room  for further assessment, seen by the ED physician, and felt to have  apparent pneumonia on chest x-ray and will be admitted for IV  antibiotics and routine therapy.   PAST MEDICAL HISTORY:  1. Significant for hypertension.  2. Non-insulin dependent diabetes.  3. Significant history of schizophrenia.  4. Hyperlipidemia.   ALLERGIES:  No known drug allergies.   MEDICATIONS:  1. He is on (the best I can find out) Actos 30 mg once a day.  2. Trandolapril 4 mg once a day.  3. Trihexyphenidyl 5 mg 3 times a day.  4. Glyburide/metformin 5/500 one tablet 3 times a day.  5. Simvastatin 40 mg once a day.  6. Omeprazole 20 mg once a day.  7. Hydrochlorothiazide 25 mg once a day.  8. Haldol once monthly injection.  9. Hydrocodone and acetaminophen daily.  10.Ibuprofen 1-2 q.6 h. p.r.n. for pain.   REVIEW OF SYSTEMS:  He denies any problems with his vision.  Does note  questionably a little sore throat.   Denies any specific chest pain.  Does note some pain with coughing in his chest area.  Does have a little  nausea, but no vomiting.  Has had decreased appetite over the last  several days, does have body aches all over, and continues to have  headache.  No rashes.   SOCIAL HISTORY:  The patient apparently lives by himself, but his wife  is at bedside.  Does not get into this and is nonsmoker and nondrinker.  No other drug abuse per his report.   PHYSICAL EXAMINATION:  VITAL SIGNS:  Temperature is 105.3, blood  pressure 129/88, pulse is 130, respiratory rate is 20.  GENERAL:  This is a well-developed, well-nourished African American  gentleman lying in bed in no acute distress.  HEENT:  Pupils equal, round, and reactive to light and accommodation.  Does have some boggy sclera.  I believe this probably is baseline.  Oropharynx does show some erythema.  Posterior oropharynx with no  ulceration.  Mucous membranes are moist.  Neck is supple.  No meningeal  signs.  CARDIOVASCULAR:  Tachycardiac but regular rhythm, not appreciate any  murmurs.  LUNGS: Good air movement throughout.  I did not appreciate any rhonchi  or crackles at this time on anterior exam.  ABDOMEN:  Soft, nontender, but protuberant.  Positive bowel sounds.  EXTREMITIES:  No lower extremity edema.  NEUROLOGIC:  The patient is alert and oriented x3, moving all  extremities without difficulty.  SKIN:  No rash.  PSYCHIATRIC:  The patient appears to be stable, but flat affect.   Two blood cultures are obtained.  Did have cultures from.  CSF which was  negative.  Gram-stain also was negative.  CBC today shows a white count  of 13.0, hemoglobin 12.2, platelet count of 166, BMET of sodium 131,  potassium 3.4, chloride 94, CO2 25, glucose 322, BUN 19, creatinine  1.36.  Blood cultures x2 negative.  Chest x-ray revealed left midlung  air space disease consistent with pneumonia.  Recommend follow up for  resolution.    IMPRESSION:  This is a 50 year old [place] American male with what  appears to be pneumonia.   ASSESSMENT AND PLAN:  1. Pneumonia.  We will start the patient on Rocephin and azithromycin.      We will treat his fever with Tylenol, ibuprofen given the      significant nature.  We will use oxygen if needed, but he has had      approximately 93-96% on room air.  Does not have any shortness of      breath complaint.  2. Nausea.  We will treat with Zofran and bland diet initially.  3. Diabetes.  Sounds though the patient has been noncompliant with his      medications.  States that he has not been taking them routinely and      has not checked his blood sugars, but per his wife that they have      been running up.  4. We will continue with his Actos, glyburide, metformin, and then      cover with sliding scale insulin if needed.  His initial sugar was      in the 300 range.  5. Schizophrenia.  Did just have his Haldol shot last week and states      that if he gets this and does take care of it.   DISPOSITION:  The patient will be admitted to floor and continue IV  antibiotics and followup on pneumonia symptom resolution.      Catalina Pizza, M.D.  Electronically Signed     ZH/MEDQ  D:  09/18/2008  T:  09/18/2008  Job:  [id number]

## 2011-05-08 NOTE — Discharge Summary (Signed)
NAMEJATHAN, [name] NO.:  [id number]   MEDICAL RECORD NO.:  [id number]          PATIENT TYPE:  INP   LOCATION:  1510                         FACILITY:  [facility]   PHYSICIAN:  Altha Harm, MDDATE OF BIRTH:  February 17, 1961   DATE OF ADMISSION:  09/21/2008  DATE OF DISCHARGE:  09/24/2008                               DISCHARGE SUMMARY   DISCHARGE DISPOSITION:  Home.   FINAL DISCHARGE DIAGNOSES:  1. Community acquired pneumonia.  2. Hypertension uncontrolled.  3. Sinus tachycardia.  4. Diabetes type 2 uncontrolled.  5. History of schizophrenia.  6. History of hyperlipidemia.   DISCHARGE MEDICATIONS:  1. Actos 30 mg p.o. daily.  2. Trandolapril 4 mg p.o. daily.  3. Artane 5 mg p.o. t.i.d.  4. Simvastatin 40 mg p.o. daily.  5. Glyburide/Glucophage 5/500 mg p.o. t.i.d.  6. Omeprazole 20 mg p.o. daily.  7. Hydrochlorothiazide 25 mg p.o. daily.  8. Haldol depo shot 150 mg given IM on special schedule, next dose      scheduled for October 01, 2008.  9. Hydrocodone acetaminophen 1 tablet p.o. q.6 hours p.r.n.  10.Ibuprofen as needed.  11.Avelox 400 mg p.o. daily x3.  12.Lopressor 12.5 mg p.o. b.i.d.   CONSULTANTS:  Psychiatry.  The patient refused consultation with  psychiatry.   ALLERGIES:  No known drug allergies.   CODE STATUS:  Full code.   PRIMARY CARE PHYSICIAN:  Edward L. Juanetta Gosling, M.D., [facility].   CHIEF COMPLAINT:  Pneumonia.   HISTORY OF PRESENT ILLNESS:  Please refer to the H and P dictated by Dr.  Ardyth Harps for details of the HPI.   HOSPITAL COURSE:  1. The patient was admitted to our hospital from [facility]      due to the patient's behaviors related to his psychiatric illness.      The patient continued treatment for his pneumonia with IV Rocephin      and Zithromax which was subsequently changed to Avelox.  The      patient never had any oxygen requirement even from the onset of his      transfer here to the hospital.  The  patient continued without any      hypoxia and he is to continue his Avelox for an additional 4 days,      prescription has been issued.  2. Hypertension.  The patient had a history of hypertension and had      uncontrolled blood pressures in the hospital.  Along with his      uncontrolled blood pressure the patient also had a sinus      tachycardia and thus metoprolol was added to his regimen of      medications.  The metoprolol dosing is 12.5 mg p.o. b.i.d.  Please      note that the patient was very agitated during his time here in the      hospital owing to his own personal beliefs.  The patient states      that when hospitalized he does become very agitated and has an      increase in his blood pressure and heart  rate.  The patient was      started on Lopressor 12.5 mg p.o. b.i.d.  I have spoken to him      about measuring his blood pressure in the ambulatory setting and      speaking to Dr. Juanetta Gosling about adjusting his medications as      necessary.  3. History of schizophrenia.  The patient was stable while      hospitalized.  He had no suicidal or homicidal ideations.  The      patient was offered a consult with psychiatry and declined, stating      that he had his own mental health counselor at [facility] and would prefer to follow up with them.  I have spoken with      the personnel at [facility] in particular Mr. Kimber Relic      who advised that the patient could come in as a walk in any day of      the week between 8 and 4 if he felt he needed additional      counseling.  The patient is scheduled for his Haldol shot at the      mental health shot clinic on October 01, 2008.  Mr. Hyman Hopes reports      that the patient has been compliant with his outpatient psych      treatments and they have had no difficulty with this.  4. Diabetes type 2.  Blood sugars were elevated and will need further      titration as an outpatient.  However, the patient declined any       adjustments in this medication.  Thus the patient was resumed on      his usual medications.  Otherwise the patient remained stable.  The      patient is at this time stable to go home.  He is afebrile.  Blood      pressure is 148/92.  The patient is ambulatory without any      requirement for oxygen.  Lungs are clear to auscultation.   DIETARY RESTRICTIONS:  The patient should be on a low-cholesterol, low-  sodium diabetic diet.   PHYSICAL RESTRICTIONS:  None.   ACTIVITIES:  As tolerated.   FOLLOWUP:  The patient is to follow up with Dr. Juanetta Gosling in 1 week and to  follow up with his already scheduled appointment at the mental health  clinic on October 01, 2008.      Altha Harm, MD  Electronically Signed     MAM/MEDQ  D:  09/24/2008  T:  09/24/2008  Job:  [id number]   cc:   Ramon Dredge L. Juanetta Gosling, M.D.  Fax: [phone]

## 2011-05-08 NOTE — Group Therapy Note (Signed)
NAMEBLAISE, GRIESHABER           ACCOUNT NO.:  [id number]   MEDICAL RECORD NO.:  [id number]          PATIENT TYPE:  INP   LOCATION:  A334                          FACILITY:  [facility]   PHYSICIAN:  Edward L. Juanetta Gosling, M.D.DATE OF BIRTH:  1961-06-11   DATE OF PROCEDURE:  DATE OF DISCHARGE:                                 PROGRESS NOTE   Mr. Summerlin says he is better.  He still has significant leg swelling,  however.  He has no other new complaints.  He was reluctant to take the  injectable antipsychotic from our staff and eventually worked up for the  mental health center to come and give him his injectable antipsychotic  and I am pleased that we have done that because he is very difficult to  manage as far as his schizophrenia is concerned and he collapses into  very paranoid behavior quite easily.  Thus far, however, he has done  well.   His exam shows his temperature is 98.6, pulse 92, respirations 20, blood  pressure 153/81, O2 sats 94% on 4 L.  His leg is still markedly swollen  and still somewhat tender.  He remains on vancomycin and Ancef and has  improved, but his leg is still very tender.   He has cellulitis of the leg which is I think slightly better.  He has  schizophrenia unchanged.  He has diabetes and his blood sugars been  pretty good.  This morning he had a BMET that shows sugar of 288, BUN of  10, creatinine 1.14, and his CBC showed white count 10,400, hemoglobin  is 10.9, platelets 118.   ASSESSMENT:  He is slowly improving.  He does have schizophrenia.  He  has diabetes, and he has cellulitis of the leg.  I do not plan to change  anything today.      Edward L. Juanetta Gosling, M.D.  Electronically Signed     ELH/MEDQ  D:  07/07/2009  T:  07/07/2009  Job:  [id number]

## 2011-05-08 NOTE — Group Therapy Note (Signed)
NAMEJIGAR, [name]           ACCOUNT NO.:  [id number]   MEDICAL RECORD NO.:  [id number]          PATIENT TYPE:  INP   LOCATION:  A336                          FACILITY:  [facility]   PHYSICIAN:  Edward L. Juanetta Gosling, M.D.DATE OF BIRTH:  [birth year]   DATE OF PROCEDURE:  DATE OF DISCHARGE:  09/21/2008                                 PROGRESS NOTE   Mr. [name] continues to complain of severe headache.  He also does  have pneumonia.  My concern now is that he may have a post viral  pneumonia and it is a bit more likely that we would be dealing with  something like a Staph pneumonia.  I am going to go ahead and add  vancomycin.  In addition, I am going to add Tamiflu, although I cannot  prove that he had H1N1 flu.  He certainly has had high fevers to 105 and  headache to the point that he ended up having a lumbar puncture.   PHYSICAL EXAMINATION:  VITAL SIGNS:  Otherwise, his temperature is 98.5,  pulse 102, respirations 22, and blood pressure 141/99.  GENERAL:  He still exhibits some paranoid ideation.  CHEST:  Actually fairly clear.   He has not had any new lab drawn today.   ASSESSMENT:  He has got pneumonia.  I am going to go ahead and add  Tamiflu and add vancomycin and have given him another dose of Haldol  this morning.  I do not think a great deal of help with this.      Edward L. Juanetta Gosling, M.D.  Electronically Signed     ELH/MEDQ  D:  09/21/2008  T:  09/22/2008  Job:  [id number]

## 2011-05-08 NOTE — Group Therapy Note (Signed)
NAMENEWMAN, WAREN           ACCOUNT NO.:  [id number]   MEDICAL RECORD NO.:  [id number]          PATIENT TYPE:  INP   LOCATION:  A334                          FACILITY:  [facility]   PHYSICIAN:  Edward L. Juanetta Gosling, M.D.DATE OF BIRTH:  February 17, 1961   DATE OF PROCEDURE:  07/06/2009  DATE OF DISCHARGE:                                 PROGRESS NOTE   Mr. Pinson is admitted with cellulitis of the leg and he says he still  has a lot of leg pain.  He has no other new complaints.  He is due for  an injectable antipsychotic day, but I am not sure what the medicine or  the dose he is, so we are going to try to attempt to contact his Mental  Health Center for that.  Otherwise, he is doing about the same and has  no other new complaints.   His exam shows his temperature is 97.2, pulse 110, respirations 18,  blood pressure 153/74, O2 sats 95%.  His leg is still swollen and  erythematous.   ASSESSMENT:  He is about the same.   PLAN:  Continue with his treatments.  He has a history of schizophrenia  that is being treated.  He has diabetes which is doing okay and he has  the cellulitis.      Edward L. Juanetta Gosling, M.D.  Electronically Signed     ELH/MEDQ  D:  07/06/2009  T:  07/06/2009  Job:  [id number]

## 2011-05-08 NOTE — Group Therapy Note (Signed)
[name], [name]           ACCOUNT NO.:  [id number]   MEDICAL RECORD NO.:  [id number]          PATIENT TYPE:  INP   LOCATION:  A336                          FACILITY:  [facility]   PHYSICIAN:  Edward L. Juanetta Gosling, M.D.DATE OF BIRTH:  Mar 12, 1961   DATE OF PROCEDURE:  DATE OF DISCHARGE:                                 PROGRESS NOTE   PROBLEM:  Pneumonia.   SUBJECTIVE:  [name] seems a little bit better.  He has still got  some fever, but he has become very agitated.  He does have a diagnosis  of schizophrenia and has been receiving IM Haldol, but now he is  threatening staff, threatening to throw things.   PHYSICAL EXAMINATION:  VITAL SIGNS:  Now shows his temperature is 99.2,  pulse 105, respirations 18, blood pressure 130/72, O2 sats 93% on room  air.  CHEST:  Clear, but he is very agitated.   ASSESSMENT:  He is schizophrenic.  He has pneumonia.  I am going to see  if we can get a consult with the Act Team, going to go and give him  Haldol p.o. now and until we need to do.      Edward L. Juanetta Gosling, M.D.  Electronically Signed     ELH/MEDQ  D:  09/20/2008  T:  09/21/2008  Job:  [id number]

## 2011-05-08 NOTE — H&P (Signed)
NAMEJOHAAN, RYSER           ACCOUNT NO.:  [id number]   MEDICAL RECORD NO.:  [id number]          PATIENT TYPE:  INP   LOCATION:  1510                         FACILITY:  [facility]   PHYSICIAN:  Peggye Pitt, M.D. DATE OF BIRTH:  11-03-1961   DATE OF ADMISSION:  09/21/2008  DATE OF DISCHARGE:                              HISTORY & PHYSICAL   PRIMARY CARE PHYSICIAN:  Edward L. Juanetta Gosling, MD   CHIEF COMPLAINT:  Pneumonia.   HISTORY OF PRESENT ILLNESS:  Mr. Bond is a 50 year old African  American man who was admitted on September 18, 2008, at [facility] secondary to high fevers, cough, and a severe headache.  He was  found to have a pneumonia and a lumbar puncture was performed secondary  to his headache, which was negative.  He was placed on Rocephin and  azithromycin and vancomycin was added, although, I am not sure why.  He  is transferred today to the Med-Psych Unit at [facility]  secondary to his schizophrenia and ongoing issues with the medical staff  at [facility].   ALLERGIES:  He has no known drug allergies.   PAST MEDICAL HISTORY:  Significant for;  1. Hypertension.  2. Type 2 diabetes.  3. Hyperlipidemia.  4. Schizophrenia.   CURRENT MEDICATIONS:  1. Actos 30 mg p.o. daily.  2. Trandolapril 4 mg p.o. daily.  3. Trihexyphenidyl 5 mg p.o. t.i.d.  4. Zocor 40 mg p.o. nightly.  5. Protonix 40 mg p.o. daily.  6. Hydrochlorothiazide 25 mg p.o. daily.  7. Haldol 5 mg IV p.r.n. severe agitation.  8. Rocephin 1 g IV daily.  9. Azithromycin 500 mg IV daily.  10.Tamiflu 75 mg p.o. b.i.d.   SOCIAL HISTORY:  He apparently lives by himself and he is very vague  about his history but he does state that he does not smoke or drink or  does not have any ongoing drug abuse issues.   FAMILY HISTORY:  Difficult to obtain secondary to patient being  uncooperative.   REVIEW OF SYSTEMS:  Negative except as per HPI.   PHYSICAL EXAMINATION:  VITAL  SIGNS:  Upon transfer to [facility], blood pressure 123/80, heart rate 106, respirations 20, and O2  sats 98% on room air with a temperature of 98.2.  GENERAL:  He is alert, awake, and oriented x3, and does not appear to be  in any acute distress.  HEENT:  Normocephalic and atraumatic.  His pupils are equally reactive  to light and accommodation with intact extraocular movements.  LUNGS:  He has diffuse rhonchi, but no wheezes or crackles.  HEART:  Tachycardic, but with irregular rhythm.  No murmurs, rubs, or  gallops.  ABDOMEN:  Soft, nontender, and nondistended.  Positive bowel sounds.  EXTREMITIES:  No edema.  Positive pulses.  NEUROLOGICAL:  Grossly intact and nonfocal.   ASSESSMENT AND PLAN:  1. Pneumonia as evidenced by a chest x-ray done on admission at [facility] on September 18, 2008.  We will continue Rocephin and      azithromycin.  Blood cultures  have shown no growth to date.  It      does not appear that his sputum sample has been collected, we will      attempt at this time, but of course, it might be negative with 4      days of ongoing antibiotics.  I will continue the Rocephin and      azithromycin.  At this time, I am not sure why the vancomycin is      added as the cultures have not grown MRSA.  For this reason, I will      discontinue the vancomycin at this time.  I do agree with the      Tamiflu for possible H1N1 influenza, and we will continue the      droplet precautions.  We will also check an H1N1 PCR.  Thus, for      his schizophrenia, we will continue with his Haldol.  We will      consider a Psych followup for any issues.  2. Prophylaxis, while in the hospital, we will placed him on Protonix      for GI prophylaxis and on Lovenox for DVT prophylaxis.  3. For his hypertension, we will continue on his ACE inhibitor and      hydrochlorothiazide.  4. For his hyperlipidemia, we will continue on his Zocor.   DISPOSITION:  We will  consider transitioning to p.o. antibiotics in the  morning and a possible discharge home soon if the patient continues to  do well without leukocytosis and without fevers.      Peggye Pitt, M.D.  Electronically Signed     EH/MEDQ  D:  09/21/2008  T:  09/22/2008  Job:  [id number]

## 2011-05-08 NOTE — H&P (Signed)
[name], [name] NO.:  [id number]   MEDICAL RECORD NO.:  [id number]          PATIENT TYPE:  IPS   LOCATION:  0405                          FACILITY:  [facility]   PHYSICIAN:  Anselm Jungling, MD  DATE OF BIRTH:  Aug 28, 1961   DATE OF ADMISSION:  05/30/2009  DATE OF DISCHARGE:                       PSYCHIATRIC ADMISSION ASSESSMENT   IDENTIFYING INFORMATION:  This is a 50 year old male who was  involuntarily petitioned on May 30, 2009.   HISTORY OF PRESENT ILLNESS:  The patient is here on petition papers that  state the patient has severe psychosis and is paranoid.  Initially  presented to the emergency room with [facility]  Department stating that the patient is hearing voices, having  hallucinations that someone is walking by him, feeling depressed and  drinking.  The patient stated he is talking to his deceased mother.  He  has not been sleeping.  Denied any suicidal thoughts.  No other  significant stressors were noted.  The patient did receive Geodon in the  emergency room for exacerbating hallucinations that did improve his  symptoms.   PAST PSYCHIATRIC HISTORY:  First admission that was noted to our  facility.  The patient is a client at [facility] with an Axis I diagnosis  of schizophrenia, paranoid type, unspecified.  He currently receives  Haldol injections every 3 weeks.   SOCIAL HISTORY:  This is a 50 year old male who is on disability.  He  currently lives with his girlfriend.  He has a 10th grade education.   FAMILY HISTORY:  Unknown.   ALCOHOL/DRUG HISTORY:  Appears to be some recent alcohol use.  No  apparent substance use.   PRIMARY CARE Maegan Buller:  Oneal Deputy. Juanetta Gosling, M.D.   MEDICAL PROBLEMS:  1. Non-insulin-dependent diabetes.  2. Hypertension.  3. Hypercholesteremia.   MEDICATIONS:  1. Haldol 10 mg at bedtime.  2. Metoprolol 25 mg one-half tablet twice daily.  3. Glipizide 10 mg 1 tablet twice a day.  4. Metformin 500 mg  2 tablets each morning.  5. Naprosyn 500 mg 1 tablet b.i.d. p.r.n.  6. Seroquel 50 mg 1 tablet at bedtime.  7. Actos 30 mg daily.  8. Hydrochlorothiazide 25 mg daily.  9. Omeprazole 20 mg 1 capsule daily.  10.Trihexyphenidyl 5 mg 1 tablet t.i.d.   DRUG ALLERGIES:  NO KNOWN ALLERGIES.   PHYSICAL EXAMINATION:  GENERAL:  This is a well-nourished, well-  developed male that was fully assessed at [facility] Emergency  Department.  Physical exam was reviewed with no significant findings  noted.  Again, he did receive Geodon 20 mg IM for exacerbating  hallucinations.   LABORATORY DATA:  Sodium 132.  Alcohol level less than 5.  Urine drug  screen is negative.  Urinalysis negative and glucose of 233.   MENTAL STATUS EXAM:  The patient was up in the bathroom washing his  face.  He is fully alert, little eye contact, appropriately dressed.  His speech is very brief, but clear.  He is irritable, stating to be  left alone because he is washing his face.  Thought processes, endorsing  psychosis and maybe possibly  responding to internal stimuli.  Cognitive  functioning, he appears aware of himself and place.   AXIS I:  Schizophrenia, paranoid type and unspecified.  AXIS II:  Deferred.  AXIS III:  Non-insulin-dependent diabetes.  Gastroesophageal reflux  disease.  Hypertension.  Elevated cholesterol.  AXIS IV:  Possible medical problems.  Other psychosocial problems  related to burden of illness.  AXIS V:  Current is 25-30.   PLAN:  Our plan is to check CBGs on a b.i.d. basis.  We will stabilize  his thinking.  We will add Haldol to his bedtime regime.  We will  address his substance use.  We will continue to gather more information  and identify his support group.  The patient is to follow up at [facility].  Case manager will assess his living situation.  His tentative length of  stay at this time is 3-5 days.      Landry Corporal, N.P.      Anselm Jungling, MD  Electronically  Signed    JO/MEDQ  D:  05/31/2009  T:  05/31/2009  Job:  [id number]

## 2011-05-08 NOTE — Consult Note (Signed)
[name], Boyd NO.:  [id number]   MEDICAL RECORD NO.:  [id number]          PATIENT TYPE:  INP   LOCATION:  1505                         FACILITY:  [facility]   PHYSICIAN:  Acey Lav, MD  DATE OF BIRTH:  10-24-1961   DATE OF CONSULTATION:  DATE OF DISCHARGE:                                 CONSULTATION   REASON FOR INFECTIOUS DISEASE CONSULTATION:  Patient with severe  cellulitis that had been slow to respond to broad-spectrum antibiotics.   HISTORY OF PRESENT ILLNESS:  Mr. [name] is a 50 year old African  American gentleman with past medical history significant for paranoid  schizophrenia, prior incarceration, diabetes mellitus, hypertension who  claims that he was bitten by some insects he believes mosquitos earlier  in July. Approximately a week later he developed acute onset of swelling  and pain in his left foot and ankle extending up to his knee.  He was  seen and evaluated on July 12 at [facility] and found to have  on exams what seemed clinically to be consistent with severe cellulitis.  He was admitted and started on vancomycin and cefazolin.  He spiked a  temperature up to 102.8 and then was broadened to vancomycin and Zosyn.  This was done on July 14.  He continued on these antibiotics and  according to some of the notes did have some improvement; for example,  there is a note by Dr. Juanetta Gosling on the 15th which mentioned  he did have  some improvement.  However, his erythema and swelling  never resolved  completely and some of the physicians treating him felt that he had had  no improvement.  He had had Dopplers to look for deep venous  thrombosis of this leg and these were negative.  He also had a D-dimer  which was negative as well.  He was transferred to [facility]  to have arterial brachial indexes checked of his blood flow there.  He  had been admitted to the hospitalist service.  He has had a CT scan of  his lower  extremity with intravenous contrast done today.  This shows  diffuse subcutaneous edema in the left lower leg without evidence of rim  enhancing fluid collections to suggest abscess.  There is no evidence of  intramuscular fluid collection.  There is no enhancement of the fascia  or muscles and no bony destructive changes.  Essentially there is  evidence of cellulitis without abscess or osteomyelitis or myositis.  His labs were also pertinent for elevated sed rate of 68 and C-reactive  protein of  13.  We are asked to see this patient to help manage  his  episode of severe cellulitis.  Other than the apparent bug bites he  sustained he can recall no other injury to this site.  He lives in  [place].  He had not frequented any beaches within the last several  years.  He had not been to any ponds.  He had not been fishing.  He had  not stuck his leg in any bodies of fresh water.  He  showers.  He  does  not use a bathtub. He has not been in any hot tubs.  He has a history of  sneaking gin from time to  time but denies drinking alcohol daily.  He  has not ingested shellfish recently.   PAST MEDICAL HISTORY:  1. Paranoid schizophrenia with admission in June to Psychiatry with a      protracted stay.  2. Diabetes mellitus.  3. Hypertension.  4. Gastroesophageal reflux disease.  5. Prior admission in September of 2009 for pneumonia.  6. Hyperlipidemia.   FAMILY HISTORY:  Mother died due to complications related to diabetes  mellitus.  Father died with a cancer.   SOCIAL HISTORY:  The patient lives by himself with a lady friend.  He  has not been married although he did try to get married once  when he  was at [facility] and escaped with another patient.   REVIEW OF SYSTEMS:  Pertinent for pain and swelling in the lower  extremity and fevers initially.  Otherwise a 10-point review systems is  negative.   ANTIBIOTIC HISTORY:  History of vancomycin and cefazolin started on   July 12 having been changed on July 14 to vancomycin, piperacillin/  tazobactam.  At current admission here, he was changed of vancomycin  1500 mg every 12 hours and clindamycin 600 mg every 6 hours.   OTHER MEDICATIONS:  Aspirin, Depakote, Haldol, heparin, insulin, Ativan,  metoprolol, naproxen,  Artane, Norco, Roxicodone.   ALLERGIES:  THORAZINE.   PHYSICAL EXAMINATION:  Temperature maximum in the last 24 hours is 98.8,  temperature current 98.4, blood pressure 146/85, pulse 79, respirations  20, pulse ox 99% on room air.  GENERAL:  Quite pleasant gentleman in no acute distress, sitting  on  couch in his room.  He is alert and oriented x3 and even gave me the  exact minutes.  HEENT:  Normocephalic, atraumatic.  Pupils equal, round, reactive to  light.  Sclerae icteric.  Oropharynx clear.  CARDIOVASCULAR:  Regular  rate and rhythm.  No murmurs, gallops or rubs.  LUNGS:  Clear to auscultation bilaterally.  No wheezing, rhonchi or  rales.  ABDOMEN:  Soft, slightly  protuberant but not tender and no evidence of  organomegaly on exam.  He has apparent talc powder which he has spread  all over his chest and his right lower extremity.  His left lower  extremity is  notable for significant edema that  spreads up  approximately 5 cm proximal to his knee.  The skin is shiny and swollen.  He has several bullous areas more inferiorly.  He has tenderness to  palpation throughout this area and his foot as well which is also  grossly edematous.   LABORATORY DATA:  CT scan as described above.  CBC - white blood cell  count 10.0, hemoglobin 10.9, platelets 358.  Metabolic panel-  sodium  141, potassium 4, chloride 101, bicarb 29, BUN and creatinine 8 and 1.3,  glucose 110.  C-reactive protein 13.  Sedimentation rate 68.  Vancomycin  trough on the 19th was 15.2, hemoglobin A1c 9.9.   Microbiological data:  Blood cultures on the 12th two out of two no  growth. Prior blood cultures on September 18, 2008 two out of two on  26th  no growth.  Spinal fluid cultures on the 23rd of 2009 no growth.  Urine cultures from September 18, 2008 no growth.   IMPRESSION AND RECOMMENDATIONS:  This is a 50 year old African American  gentleman with  schizophrenia, diabetes mellitus and cellulitis which has  been slow to resolve.   1. Cellulitis with bullous components:  This seems  most likely      consistent with a cellulitis due to methicillin-resistant      Staphylococcus aureus.  Certainly other microbe that can present      this way include streptococci.  Also occasionally organism such as      Vibrio vulnificus or Aeromonas hydrophilia can present this way.      However, the patient has not had seawater or fresh water exposures      that would put him at risk for this.  He is not known to be a      cirrhotic.  He has not ingested shellfish.  These seem less likely.      Certainly his diabetes puts him at increased risk with immune      compromised neutrophil function but I think it is more likely the      patient has simply severe methicillin-resistant staph aureus      infection.  Certainly he might have had myositis originally.  He      has been on antibiotics for now at least 10 days and certainly much      of this may be be improving.  He does have elevated sedimentation      rate and C-reactive protein, but has no evidence radiographically      of osteomyelitis.  I am going to narrow his antibiotics to      vancomycin alone.  I see no reason to give him gram-negative      coverage at this point in time with piperacillin/ Tazobactam and it      also gives him unnecessary  anaerobic coverage.  Additionally      clindamycin while having excellent streptococcal  coverage and      inhibiting toxins, is going to put the patient at risk for C diff      colitis.  The vancomycin will certainly cover methicillin-resistant      staph aureus which seems the most likely pathogen and will cover       streptococci as well.  I will elevate his leg.  Will have wound      care wrap his leg with dressings to try to reduce the edema.  I      think that if he if begins to improve or at least remain  stable,      he could be discharged on oral antibiotics.  I would like to      observe him for now on vancomycin alone to see if he does well with      narrow gram-positive coverage.  Should he deteriorate I may get an      MRI of his leg.  He has a history of some  anxiety but he believes      he could tolerate MRI scanner.  For screening purposes I will check      him for HIV and viral hepatitis  as well as syphilis with an RPR      check.   Thank you for this infectious disease consultation.  I will follow along  closely.      Acey Lav, MD  Electronically Signed     CV/MEDQ  D:  07/14/2009  T:  07/14/2009  Job:  [id number]   cc:   Anselm Jungling, MD

## 2011-05-08 NOTE — Group Therapy Note (Signed)
NAMEASHANTE, SNELLING           ACCOUNT NO.:  [id number]   MEDICAL RECORD NO.:  [id number]          PATIENT TYPE:  INP   LOCATION:  A334                          FACILITY:  [facility]   PHYSICIAN:  Edward L. Juanetta Gosling, M.D.DATE OF BIRTH:  12-27-1960   DATE OF PROCEDURE:  DATE OF DISCHARGE:                                 PROGRESS NOTE   Mr. Gikas was admitted yesterday with cellulitis of the leg and  multiple other medical problems.  He states he is doing okay, but still  having a lot of pain in his leg.  His temperature is high at 102.  He is  on vancomycin and Ancef, which should provide good coverage.  I am going  to go ahead and ask for blood cultures with next temp if he has more  spikes.  I am going to get some warm compresses to his leg and see if  that will help him symptomatically.      Edward L. Juanetta Gosling, M.D.  Electronically Signed     ELH/MEDQ  D:  07/05/2009  T:  07/06/2009  Job:  [id number]

## 2011-05-08 NOTE — Group Therapy Note (Signed)
NAMEJASMINE, [name]           ACCOUNT NO.:  [id number]   MEDICAL RECORD NO.:  [id number]          PATIENT TYPE:  INP   LOCATION:  A334                          FACILITY:  [facility]   PHYSICIAN:  Melissa L. Ladona Ridgel, MD  DATE OF BIRTH:  [birth year]   DATE OF PROCEDURE:  DATE OF DISCHARGE:  07/13/2009                                 PROGRESS NOTE   TRANSFERRING DIAGNOSES:  1. Left leg cellulitis which is not resolving after 12 days of      antibiotic therapy.  2. Diabetes.  3. Schizophrenia.  4. Hypertension.  5. Gastroesophageal reflux disease.  6. Chronic pain.   CURRENT MEDICATIONS:  Aspirin 81 mg, Depakote 1000 mg at bedtime, Haldol  20 mg at bedtime, sliding scale insulin, Lantus 20 mg q.h.s., Ativan 1  mg p.o. b.i.d., metoprolol 12.5 mg p.o. b.i.d., naproxen 500 mg p.o.  b.i.d., Zosyn 3.375 grams IV q. 8, Artane 5 mg p.o. t.i.d., vancomycin  dose per pharmacy, Vicodin 5/500 q.6 h. p.r.n., oxycodone 5 mg p.o. q.4  h. p.r.n., as stated vancomycin per pharmacy.   HOSPITAL COURSE:  The patient is a 50 year old African American male who  presented to Dr. Juanetta Gosling' office with new onset swelling and redness of  the left leg.  The patient was evaluated and admitted to the hospital  for further care.  The patient was started on Zosyn and vancomycin and  has been on this medication since his admission on July 04, 2009.  The  patient has made has no significant improvement in the leg, which  appears to have continued cellulitis with bullae, erythema noted, and  significant swelling.   We have made every attempt to assist him in caring for his diabetes,  which is contributing to this lower extremity infection, however, the  patient continues to be noncompliant, eating around his diet and  obtaining food that is not technically prescribed for him.   [name] initial temperature upon arrival to the hospital was 102.  He did receive coverage with Ancef, which was subsequently switched  to  Zosyn.  The patient was maintained on his home medications for his  schizophrenia and at times did appear agitated.  He received a dose of  injectable antipsychotic, Haldol decanoate 150 mg IM on July 06, 2009.   The patient is, in general, fairly well controlled with his behavior,  pleasant but the last 2 nights he has been agitated, speaking about  racial issues, wandering the hallway and generally being verbally  abusive to staff.  He does settle down with some show of authority,  especially from security.  In evaluating the wound on this patient's leg  for the last 3 days myself, I have found that there has really not been  any significant change in the level of edema, erythema and/or pain.  I  am recommending that the patient be transferred to [facility] to assist  in caring for both his medical and psychiatric needs and to consider  other plans including a possible vascular evaluation.  I briefly  reviewed the case with the on-call vascular physician.  I will obtain  arterial studies and we will contact him if necessary.   On the day of discharge, the patient is continuing to be a little bit  garrulous.  His T-max was 98.5 this morning, is 98.4, blood pressure  141/85, pulse 79, respirations 20, saturation 93%.  GENERAL:  This is a morbidly obese African American male who is angry  but not at anything specific.  He is normocephalic, atraumatic.  Pupils  are equal, round and reactive to light.  Extraocular muscles are intact.  Has anicteric sclerae.  Examination of the nose reveals septum midline  without discharge.  Examination of the mouth reveals no oral lesions or  lip lesions to the best of my ability to examine him.  He is allowing me  to listen to his heart and lungs, but gets a little bit paranoid about  other systems.  Examination of the neck reveals no thyromegaly.  Trachea is in the  midline.  CHEST:  Decreased but clear to auscultation.  There are no rhonchi,   rales or wheezes.  CARDIOVASCULAR:  Regular rate and rhythm.  Positive S1 and S2.  No S3 or  S4.  No murmurs, rubs or gallops.  ABDOMEN:  Obese, nontender, nondistended, with positive bowel sounds.  EXTREMITIES:  Reveal a left lower leg that is red and swollen with  little rosettes of redness.  He has some bullous formation where the  skin is starting to retract slightly but otherwise purulent material is  noted.   His pertinent laboratories during the course of the stay here at [facility] reveal a sodium of 141, potassium 4.0, chloride 101, CO2 29, BUN is  8, creatinine is 1.13, glucose is 110.  C-reactive protein is 13.  Vancomycin trough was 15.2.  ESR was 68.  His last sodium is 137,  potassium 4.0, chloride 100, CO2 30, BUN 10, creatinine 0.83, and  glucose of 206.  Urinalysis has been negative.  Blood cultures have also  been negative.   ASSESSMENT/PLAN:  This is a 50 year old African American male who  presented with lower extremity edema, swelling, and erythema initially  thought to be a cellulitis.  He is not, however, responding to 9 days of  adequate antibiotic therapy.  The patient has been having difficulty  with his schizophrenia and he does not desire to be at [facility] and is  requesting transfer to [facility].  This is not, however, the reason for  his transfer in that I feel at this time that he needs to be seen by at  least a dermatologist and/or specialist perhaps in vascular to see if  there is an underlying reason that he is not improving.  I did review  the case briefly with on-call vascular.  I will request arterial studies  and consider an Ace wrap.  1. Left leg cellulitis.  I will discontinue his Zosyn and start      clindamycin.  Will continue his vancomycin.  I will obtain arterial      studies and if they are within normal limits, go ahead with an Ace      wrap on that leg.  2. Schizophrenia.  Will attempt to get a medicine psychiatric bed for      this  patient in order to help manage his symptoms little bit more      safely.  He will continue with his Haldol and his Depakote.  3. Diabetes.  The patient is persistently hyperglycemic because of  finding food in addition to the food that has been prescribed for      him.  We did increase his Lantus approximately 2 days ago and      started to see a little bit of improvement, but generally around      his meals he is hyperglycemic, therefore, tighter control might be      able to be obtained where he cannot get food as readily.   Total time on this case was 30 minutes.   The plan is to transfer the patient to a medicine psychiatric bed at  [facility] in an attempt to obtain further information about the nature  of his swelling and edema.      Melissa L. Ladona Ridgel, MD  Electronically Signed     MLT/MEDQ  D:  07/13/2009  T:  07/13/2009  Job:  [id number]

## 2011-05-08 NOTE — H&P (Signed)
[name], [name]           ACCOUNT NO.:  [id number]   MEDICAL RECORD NO.:  [id number]          PATIENT TYPE:  INP   LOCATION:  A334                          FACILITY:  [facility]   PHYSICIAN:  Edward L. Juanetta Gosling, M.D.DATE OF BIRTH:  1961/11/14   DATE OF ADMISSION:  07/04/2009  DATE OF DISCHARGE:  LH                              HISTORY & PHYSICAL   REASON FOR ADMISSION:  Cellulitis of the left leg.   HISTORY:  Mr. Keast is a 50 year old African American male, who came  to the emergency room because of pain in his legs.  He said that he  noticed problems with his leg about 24 hours ago.  He has been febrile  and came to the emergency room where he was noted to have a marked  inflammatory change in his leg and because of the degree of problem in  his leg, it was felt that he should be admitted particularly considering  his history of diabetes.   PAST MEDICAL HISTORY:  Positive for diabetes, for schizophrenia,  hypertension, GERD, and chronic pain.   MEDICATIONS:  1. Haldol 20 mg at bedtime.  2. Depakote 1000 mg at bedtime.  3. Lopressor 12.5 mg b.i.d.  4. Glucotrol 10 mg b.i.d.  5. Naproxen 500 mg b.i.d.  6. Actos 30 mg daily.  7. Hydrochlorothiazide 25 mg daily.  8. Protonix 40 mg daily.  9. Artane 5 mg t.i.d.  10.Ativan 1 mg b.i.d.   FAMILY HISTORY:  Essentially unknown.   SOCIAL HISTORY:  He had been living in a assisted living center, he is  not now.  He does not currently smoke cigarettes or drink any alcohol.   REVIEW OF SYSTEMS:  Except as mentioned is negative.   PHYSICAL EXAMINATION:  GENERAL:  A well-developed, well-nourished  [place] American male, who is in no acute distress.  VITAL SIGNS:  His blood pressure is in the 120/70 range, pulse is in the  80s.  He is afebrile.  HEENT:  His pupils are reactive.  Nose and throat are clear.  NECK:  Supple without masses.  CHEST:  Clear without wheezes.  HEART:  Regular without murmur, gallop, or rub.  ABDOMEN:   Soft.  EXTREMITIES:  No edema.  CENTRAL NERVOUS SYSTEM:  Grossly intact.   ASSESSMENT:  He has cellulitis of the leg.   PLAN:  To bring him in to the hospital for treatment with IV  antibiotics.  He will stay on his other medications.  He will be on  sliding scale insulin and decide what to do from there.      Edward L. Juanetta Gosling, M.D.  Electronically Signed     ELH/MEDQ  D:  07/04/2009  T:  07/05/2009  Job:  [id number]

## 2011-05-08 NOTE — Discharge Summary (Signed)
NAMEJUD, FANGUY           ACCOUNT NO.:  [id number]   MEDICAL RECORD NO.:  [id number]          PATIENT TYPE:  INP   LOCATION:  1505                         FACILITY:  [facility]   PHYSICIAN:  Monte Fantasia, MD  DATE OF BIRTH:  [birth year]   DATE OF ADMISSION:  07/13/2009  DATE OF DISCHARGE:  07/17/2009                               DISCHARGE SUMMARY   PRIMARY CARE Melis Trochez:  Dr. Juanetta Gosling in [place].   DISCHARGE DIAGNOSES:  1. Left leg cellulitis, possible methicillin-resistant Staphylococcus      aureus.  2. Diabetes, which is controlled.  3. Schizophrenia.  4. Hypertension.  5. Anemia of chronic disease.   MEDICATIONS AT DISCHARGE:  1. Lopressor 50 mg p.o. b.i.d.  2. Doxycycline 100 mg p.o. b.i.d. for 3 weeks.  3. Ferrous sulfate 325 mg p.o. b.i.d.  4. Haldol 20 mg p.o. nightly.  5. Depakote 1000 mg p.o. nightly.  6. Glucotrol 10 mg p.o. b.i.d.  7. Actos 30 mg p.o. daily.  8. Protonix 40 mg p.o. daily.  9. Artane 5 mg p.o. 3 times a day.  10.Ativan 1 mg p.o. b.i.d.   HOSPITAL COURSE:  Mr. Quirino is a 50 year old African American  gentleman patient with significant past medical history of paranoid  schizophrenia, diabetes, hypertension, was admitted initially to [facility] with complaints of left leg swelling.  The patient was  admitted and was found to have findings consistent with severe  cellulitis, and initially started on vancomycin and cefazolin.  It was  then gradually broadened to vancomycin and Zosyn.  The patient did  gradually improve with his swelling and erythema.  However, did not  completely resolve.  The patient did have Dopplers of his legs and deep  vein thrombosis was ruled out.  The patient also was transferred to  [facility] to check arterial brachial indexes, which were  normal.  CT scan of the lower extremity was done, which showed diffuse  acute lymphedema, without any evidence of enhancing fluid collection,  suggestive of abscess.  No evidence of intramuscular fluid collection.  No enhancement of facial muscles or bony destructive changes.  Essentially, there was evidence of cellulitis without abscess or  osteomyelitis.  The patient was evaluated by Dr. Daiva Eves, ID and per the  recommendations, the patient's antibiotics were narrowed down to  vancomycin.  The patient improved well through the stay, and at present,  edema of his extremities has come down significantly.  However, as per  diabetic recommendations, would switch the patient to Doxycycline and  would continue it for 3 more weeks to complete a course for MRSA  treatment.  The patient, at present, has a normal white count and has  been afebrile through the stay in the hospital.   RADIOLOGICAL INVESTIGATIONS:  1. During the stay in the hospital, lower venous duplex studies done      July 04, 2009.  Impression:  No evidence of left lower extremity      occlusive deep vein thrombosis.  2. CT of the extremities done on July 14, 2009.  Impression:      Cellulitis without underlying  abscess or CT evidence of      osteomyelitis.  No evidence of myositis.   LABS DONE PRIOR TO DISCHARGE:  Total WBC 8.1, hemoglobin 10.9,  hematocrit 32.8, platelet count of 396,000, sodium 138, potassium 4.3,  chloride 106, bicarb 27, glucose 121, BUN 10, creatinine 0.94, RPR  nonreactive.  Blood cultures done x2 on July 04, 2009.  No growth for 5  days.   DISPOSITION:  The patient at present is medically stable to be  discharged.  The patient is recommended to follow up with his primary  care physician within the next 1 to 2 weeks.  Would continue oral  Doxycycline 100 mg p.o. b.i.d. for 3 more weeks.  The patient has been  counseled regarding the same.  Total time for discharge is 40 minutes.      Monte Fantasia, MD  Electronically Signed     MP/MEDQ  D:  07/17/2009  T:  07/17/2009  Job:  [id number]   cc:   Ramon Dredge L. Juanetta Gosling, M.D.  Fax:  [phone]   Acey Lav, MD  Fax: [phone]

## 2011-05-08 NOTE — Consult Note (Signed)
NAMEEILAN, MCINERNY NO.:  [id number]   MEDICAL RECORD NO.:  [id number]          PATIENT TYPE:  INP   LOCATION:  1505                         FACILITY:  [facility]   PHYSICIAN:  Antonietta Breach, M.D.  DATE OF BIRTH:  11/04/1961   DATE OF CONSULTATION:  07/15/2009  DATE OF DISCHARGE:  07/17/2009                                 CONSULTATION   REQUESTING PHYSICIAN:  [facility].   REASON FOR CONSULTATION:  Psychosis.   HISTORY OF PRESENT ILLNESS:  Mr. [name] is a 49 year old male  admitted to the [facility] after transfer from the [facility] due to cellulitis and uncontrollable psychosis.   He has been admitted to the medical psych unit at [facility].   [name] suffers from a bizarre paranoid delusional system that  involves believing that Caucasian people are conspiring against him.   This goes beyond bias.  Today, he asked one of the African American  nurse's whether her spouse was Caucasian or African American.  He then  went on to be very expansive and intrusive and asked her about her  personal life.  He stated that he wanted to move in with her.   She was able to redirect him back to appropriate bedside care.  However,  his psychosis does continue to cause disruptions in his care.  He  obviously has impaired judgment.   He exhibits a very angry and hostile mood towards Caucasian healthcare  providers, particularly males.   At [facility], his inpatient attending physician was a Caucasian male.  She eventually had to transfer him to [facility] because his psychosis  was disturbing to the medical ward and interfering with the care of his  cellulitis.   This exacerbation of psychosis appears to have been occurring for the  past 10 days.   He does have intact memory and orientation function.   PAST PSYCHIATRIC HISTORY:  He has had prior psychotic episodes and has  been diagnosed with schizophrenia.   He has been treated with Haldol 20  mg nightly along with Ativan 1 mg b.i.d.   In review of the past medical record, Dr. Lubertha Basque record was reviewed.  She was continuing his Depakote and Haldol at the [facility].   [name] was admitted to the [facility] in June  2010.  He was involuntarily committed.  At that time, he was hearing  voices and having hallucinations that someone was walking by him.  He  was depressed and was drinking at that time.  He has been talking to his  deceased mother and had been having insomnia.   At the [facility], he was diagnosed with  schizophrenia paranoid type.  He was discharged on Haldol 20 mg nightly,  Depakote 1000 mg nightly, Artane 5 mg t.i.d.   SOCIAL HISTORY:  [name] has been living with a male friend.  He  does not use alcohol or illegal drugs.  [name] is on disability.  He has been educated through the tenth grade.   PAST MEDICAL HISTORY:  1. Diabetes mellitus.  2. Hypertension.  3. Gastroesophageal reflux disease.   ALLERGIES:  CHLORPROMAZINE   MEDICATIONS:  His MAR is reviewed:  1. Depakote 1000 mg nightly.  2. Haldol 20 mg nightly.  3. Ativan 1 mg b.i.d.   LABORATORY DATA:  HIV nonreactive.  B12 unremarkable.  Folate  unremarkable.  RPR nonreactive.  Sodium 138, BUN 8, creatinine 1.01.  WBC 9.8, hemoglobin 11, platelet count 384, SGOT 23, SGPT 20.   REVIEW OF SYSTEMS:  The patient would not allow the undersigned to  proceed with an interview.  The review of systems is gleaned from the  staff, as well as the past medical record.   Constitutional, head, eyes, ears, nose, throat, mouth, neurologic,  psychiatric, cardiovascular, respiratory, gastrointestinal,  genitourinary, skin, musculoskeletal, hematologic, lymphatic, endocrine  and metabolic all unremarkable except that infectious disease has been  consulted during this current hospitalization for his  cellulitis.   PHYSICAL EXAMINATION:  VITAL SIGNS:  Temperature 98.2, pulse 88,  respiratory rate 20, blood pressure 140/94.  O2 saturation on room air  95%.  GENERAL APPEARANCE:  [name] is a middle-aged male lying in a  supine position in his hospital bed, not demonstrating any abnormal  involuntary movements.   MENTAL STATUS EXAM:  [name] is alert.  His eye contact is good.  His attention span appears to be normal.  His affect is very guarded.  His mood is very angry.  His concentration is not assessed given the  gravity of the interview with the patient.  He was quickly demanding  that the undersigned leave his room.  He did not allow formal questions  regarding his orientation, memory function, fund of knowledge,  intelligence, abstraction or thought content.  His speech was mildly  pressured.  There was no dysarthria.  Thought process was coherent,  thought content clearly guarded with an illogical anger towards the  undersigned.  Insight poor, judgment impaired grossly.   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  Rule out schizophrenia, paranoid type, chronic with  acute exacerbation.  He may have schizoaffective disorder given that  Depakote is on board.  Depakote can also provide anti-psychotic  augmentation and help with the strong affective component of his  paranoia.  History of alcohol use.  Axis II:  Deferred.  Axis III:  See past medical history.  Axis IV:  General medical.  Axis V:  Global Assessment of Functioning 30.   [name] clearly does have an exacerbation of his paranoid  psychosis.  This psychosis impairs his judgment and would make him at  risk for passive lethal self-neglect.   RECOMMENDATIONS:  1. Would continue his Haldol trial at 20 mg daily.  Would monitor for      stiffness or other extrapyramidal side effects.  His Haldol is the      antipsychotic.  2. Would continue Artane for  anticholinergic benefit in reducing the      extrapyramidal side effects of Haldol.  3. Would continue Depakote 1000 mg nightly.  4. Would continue with low-stimulation ego support efforts to      facilitate a therapeutic alliance.  5. If his paranoid condition does not improve, he will require      admission to an inpatient psychiatric unit once cleared from a      general medical perspective.      Antonietta Breach, M.D.  Electronically Signed     JW/MEDQ  D:  07/18/2009  T:  07/18/2009  Job:  [id number]

## 2011-05-08 NOTE — Discharge Summary (Signed)
NAMEKRISHIV, [name] NO.:  [id number]   MEDICAL RECORD NO.:  [id number]          PATIENT TYPE:  IPS   LOCATION:  0405                          FACILITY:  [facility]   PHYSICIAN:  Anselm Jungling, MD  DATE OF BIRTH:  [birth year]   DATE OF ADMISSION:  05/30/2009  DATE OF DISCHARGE:  06/03/2009                               DISCHARGE SUMMARY   IDENTIFYING DATA AND REASON FOR ADMISSION:  This was an inpatient  psychiatric admission for [name], a 50 year old single African  American male, who was admitted due to exacerbation of psychotic  symptoms.  Please refer to the admission note for further details  pertaining to the symptoms, circumstances and history that led to his  hospitalization.  He was given an initial Axis I diagnosis of psychosis  NOS.   MEDICAL AND LABORATORY:  The patient came to [place] with a history of non-  insulin-dependent diabetes mellitus, chronic pain, GERD.  He was  medically and physically assessed by the psychiatric nurse practitioner.  He was continued on his usual metoprolol, glipizide, metformin,  Naprosyn, Actos, hydrochlorothiazide, and omeprazole.  There were no  significant medical issues.   HOSPITAL COURSE:  The patient was admitted to the adult inpatient  psychiatric service.  He presented as a well-nourished, normally-  developed, adult male who was initially extremely irritable and hostile  towards staff.  He tended to stay in bed, and was quite defensive  whenever approached.  However, he seemed to have good insight into the  fact that he was here for treatment and needed to his psychotropic  medications, which he took willingly.  He had previously been treated  with Haldol.  Haldol was utilized in this situation as well, and  Depakote was added due to the strong affective component of his  presentation.   After sleeping for most of the first 2 days, the patient then got up,  dressed, groomed, and began participating in the  treatment program.  At  this point, he was quite appropriate, pleasant, and indicated his  approval for the medications he had been treated with.   The patient gave [place] permission to contact his guardian, and with this  individual, discharge and aftercare plans were coordinated.  The patient  appeared appropriate for discharge on the fifth hospital day.  He agreed  to the following aftercare plan.   AFTERCARE:  The patient was to follow-up at [facility] in  [place], [place], with an appointment on June 15, at 8 a.m.   DISCHARGE MEDICATIONS:  1. Haldol 20 mg every night.  2. Depakote 1000 mg every night.  3. Lopressor 12.5 mg b.i.d.  4. Glucotrol 10 mg b.i.d.  5. Naproxen 500 mg b.i.d.  6. Actos 30 mg daily.  7. Hydrochlorothiazide 25 mg daily.  8. Protonix 40 mg daily.  9. Artane 5 mg t.i.d.  10.Ativan 1 mg b.i.d.   DISCHARGE DIAGNOSES:  AXIS I:  Schizoaffective disorder not otherwise  specified.  AXIS II:  Deferred.  AXIS III:  History of hypertension, diabetes mellitus, gastroesophageal  reflux disease, chronic pain.  AXIS IV:  Stressors  severe.  AXIS V: GAF on discharge 60.      Anselm Jungling, MD  Electronically Signed     SPB/MEDQ  D:  06/03/2009  T:  06/03/2009  Job:  [id number]

## 2011-05-08 NOTE — Group Therapy Note (Signed)
[name], [name]           ACCOUNT NO.:  [id number]   MEDICAL RECORD NO.:  [id number]          PATIENT TYPE:  INP   LOCATION:  A334                          FACILITY:  [facility]   PHYSICIAN:  Melissa L. Ladona Ridgel, MD  DATE OF BIRTH:  1961/07/23   DATE OF PROCEDURE:  07/12/2009  DATE OF DISCHARGE:                                 PROGRESS NOTE   Subjectively, the patient is a little more uncooperative this evening.  He is roaming in the hall, requesting to go outside, making racial  statements about white staff members.  He was redirected to room.  He  would not share with me why he was upset but eventually was redirected  and has settled in for the evening.  He is very limited in response to  my questions with regard to pain.  He states, yes, he denies any nausea  or vomiting and really would not share with me whether he was eating  okay.   PHYSICAL EXAMINATION:  His current vital signs reveal T-max of 98.6,  blood pressure this evening was 154/90, pulse 87, respirations 20,  saturation 98%.  Currently he has no recorded bowel movements.  GENERAL:  This is a moderately obese African American male who is  agitated and uncooperative this evening compared with his yesterday when  he was a little bit more jovial.  He is normocephalic, atraumatic.  Pupils equal and reactive to light.  Extraocular muscles intact.  Mucous membranes moist.  Neck is supple.  I  do not appreciate any JVD.  CHEST:  Decreased but clear to auscultation.  There are no rhonchi hours  or wheezes.  CARDIOVASCULAR:  Regular rhythm.  Positive S1 and S2.  No S3-4, no  murmurs, rubs or gallops.  ABDOMEN:  Obese, nontender, nondistended with positive bowel sounds.  There is no hepatosplenomegaly.  There is no guarding or rebound.  EXTREMITIES:  Reveals very little improvement in the left lower  extremity.  I do see some decrease in swelling in the top of the foot,  but the leg remains tight.  There are areas of redness,  areas of  blistering.  I do not see any discharge.  NEUROLOGICALLY:  Cranial  nerves appear to be intact.  Again, the patient's attitude at this time  is very aggressive and negative, but he remains redirectionable.  Gait  is intact, as I have watched him ambulate and have walked with him up  the hallway.   PERTINENT LABORATORIES FOR TODAY:  His sodium is 141, potassium 4.0,  chloride 101, CO2 29, BUN 8, creatinine is 1.13, glucose 110.   ASSESSMENT/PLAN:  1. This is a 50 year old African American male who presented with      lower extremity edema, swelling, erythema who has ruled out for      deep venous thrombosis and currently is very slow to be responding      to Zosyn and vancomycin.  In light of the fact that I have seen      very minimal response locally, I will determine in the morning      whether we should adjust  his antibiotics, possibly to include      clindamycin.  He remains afebrile.  He is not septic.  He has not      had a white count, but locally is having a difficult time with      response to the current antibiotic therapy.  I will give it one      more day and then add clindamycin and consider either      dermatological consult, if available, and/or possible vascular      consult.  2. Schizophrenia.  The patient today is a bit more agitated.  If he      persists, we may need to consider possible transfer to psych/med      bed for now.  He has been manageable.  He did receive his      injectable decanoate antipsychotic recently and will continue with      his current regime of Haldol at bedtime as well as his Depakote.  3. Diabetes.  The patient remains minimally compliant with his current      diet.  We did increase his Lantus 2 days ago, which did improve      some of his early morning glucoses, but he persists in having      elevations over the course of the day in the a.m.  I will elect to      potentially add coverage with his meals since he persists in       speaking food, in addition to his diabetic diet.   Total time with this patient was 20 minutes.      Melissa L. Ladona Ridgel, MD  Electronically Signed     MLT/MEDQ  D:  07/13/2009  T:  07/13/2009  Job:  [id number]

## 2011-05-11 NOTE — H&P (Signed)
NAME:  [name], MONJARAZ                     ACCOUNT NO.:  [id number]   MEDICAL RECORD NO.:  [id number]                   PATIENT TYPE:  OBV   LOCATION:  A228                                 FACILITY:  [facility]   PHYSICIAN:  Milus Mallick. Lodema Hong, M.D.           DATE OF BIRTH:  02-Jun-1961   DATE OF ADMISSION:  09/09/2002  DATE OF DISCHARGE:                                HISTORY & PHYSICAL   CHIEF COMPLAINT:  The patient is a 50 year old African-American male who  presents with a one-day history of left-sided chest pain under the left  nipple.  He describes the pain as squeezing.  He denies any radiation and  associated lightheadedness, nausea, or diaphoresis.  He states he does have  this pain intermittently and has experienced it before.  The patient reports  that on the day of his admission he awoke in the morning with this pain  which he attributes to sleeping poorly.  The pain subsequently subsided on  its own.  He played basketball with no pain at the time, and when he went  back home and had pizza he again experienced this chest pain and went to the  emergency room for evaluation.   PAST MEDICAL HISTORY:  1. Hypertension.  2. Non-insulin-dependent diabetes.  3. Schizophrenia.   ALLERGIES:  No known drug allergies.   MEDICATIONS:  1. Artane 5 mg one b.i.d.  2. Mavik 1 mg q.d.  3. Glucotrol XL 5 mg q.d.   PHYSICAL EXAMINATION ON ADMISSION:  GENERAL:  The patient is alert and  oriented x4, chest pain free, in no cardiopulmonary distress.  VITAL SIGNS:  Temperature 96.8, pulse 56, respirations 20, blood pressure  136/75.  His weight is 204.5 pounds.  HEENT:  Neck is supple.  Extraocular muscles intact.  Oropharynx moist.  No  facial asymmetry.  CHEST:  Clear to auscultation throughout with no crackles or wheezes heard.  There is no reproducible chest wall tenderness.  Cardiovascular:  Heart sounds 1 and 2.  No murmurs or S3.  ABDOMEN:  Soft and nontender.  Positive bowel  sounds.  No organomegaly or  palpable masses.  EXTREMITIES:  Negative for edema or ulcers.   LABORATORY DATA ON ADMISSION:  Hemoglobin 13.1, platelet count 185, white  cell count 6.8.  Chemistry:  Sodium 135, potassium 4.1, chloride 103, CO2  30, BUN 18, creatinine 0.7, glucose 126.  D-dimer was done and this was less  than 0.5.  Initial cardiac enzymes:  CPK 293, CK-MB 3.9, troponin 0.02.   Initial EKG:  Normal sinus rhythm with a rate of 67, no evidence of ischemic  disease.  His repeat EKG one day post admission showed no change in the  pattern.   ASSESSMENT AND PLAN:  Left-sided chest pain, history not consistent with  cardiac disease.  Initial laboratory data negative for any cardiac disease.  The patient has had a second set of cardiac enzymes drawn, as well as a  fasting lipid panel.  If these are negative I will do his third set in six  hours instead of eight hours, and he will be discharged home if those are  also negative.  For his chronic diseases, he will be maintained on his  regular medications of Artane, Mavik, and Glucotrol.  He will follow up with  his primary M.D. at his regular scheduled visit, which is in the next 10  days.                                                Milus Mallick. Lodema Hong, M.D.    MES/MEDQ  D:  09/10/2002  T:  09/10/2002  Job:  [id number]

## 2011-05-11 NOTE — Discharge Summary (Signed)
[name], [name]           ACCOUNT NO.:  [id number]   MEDICAL RECORD NO.:  [id number]          PATIENT TYPE:  INP   LOCATION:  A336                          FACILITY:  [facility]   PHYSICIAN:  Edward L. Juanetta Gosling, M.D.DATE OF BIRTH:  October 13, 1961   DATE OF ADMISSION:  09/18/2008  DATE OF DISCHARGE:  09/29/2009LH                               DISCHARGE SUMMARY   FINAL DISCHARGE DIAGNOSES:  1. Pneumonia.  2. Schizophrenia with paranoid thoughts and threatening behavior.  3. Hypertension.  4. Diabetes.  5. Hyperlipidemia.   HISTORY:  Mr. Millspaugh is a 50 year old who had about a week history of  cough, congestion, and fever.  He eventually came to the [facility]  Emergency Room about 2 days prior to admission.  His headache was severe  enough.  He actually had underwent lumbar puncture, this is really  nonrevealing.  He was allowed to go home at that point on treatment for  presumed viral illness, but he continued to have difficulty.  He  eventually came back to the emergency room, was noted to have cough and  congestion.  When he came back to the emergency room, he is found to  have what appears to be a pneumonia.   PHYSICAL EXAMINATION:  His heart rate was about 100.  He was febrile to  about 100.  His chest has rhonchi bilaterally.  Heart was regular  without murmur, gallop, or rub.  Abdomen, soft.   Chest x-ray showed what appeared to be a bilateral pneumonia.   HOSPITAL COURSE:  He was treated with intravenous antibiotics and  dizziness seem to improve much.  He continued to complain a severe  headache.  He initially was started on Rocephin and Zithromax and  because it was about 48 hours, he did not improve.  I started him on  vancomycin.  He has been receiving treatment for his schizophrenia with  IM Haldol, but he became more paranoid and began threatening the staff.  His behavior got to the point that he was not able to be managed here  and he was transferred to the  Combined Med-Psych Unit at [facility] to the services of Dr. Ardyth Harps.      Edward L. Juanetta Gosling, M.D.  Electronically Signed     ELH/MEDQ  D:  09/25/2008  T:  09/26/2008  Job:  [id number]

## 2011-06-28 ENCOUNTER — Emergency Department (HOSPITAL_COMMUNITY)
Admission: EM | Admit: 2011-06-28 | Discharge: 2011-06-28 | Disposition: A | Discharge status: Home / Self Care | Payer: Medicaid Other | Attending: Emergency Medicine | Admitting: Emergency Medicine

## 2011-06-28 ENCOUNTER — Emergency Department (HOSPITAL_COMMUNITY): Payer: Medicaid Other

## 2011-06-28 DIAGNOSIS — I10 Essential (primary) hypertension: Secondary | ICD-10-CM | POA: Insufficient documentation

## 2011-06-28 DIAGNOSIS — F17200 Nicotine dependence, unspecified, uncomplicated: Secondary | ICD-10-CM | POA: Insufficient documentation

## 2011-06-28 DIAGNOSIS — J4489 Other specified chronic obstructive pulmonary disease: Secondary | ICD-10-CM | POA: Insufficient documentation

## 2011-06-28 DIAGNOSIS — E119 Type 2 diabetes mellitus without complications: Secondary | ICD-10-CM | POA: Insufficient documentation

## 2011-06-28 DIAGNOSIS — F209 Schizophrenia, unspecified: Secondary | ICD-10-CM | POA: Insufficient documentation

## 2011-06-28 DIAGNOSIS — R079 Chest pain, unspecified: Secondary | ICD-10-CM | POA: Insufficient documentation

## 2011-06-28 DIAGNOSIS — J449 Chronic obstructive pulmonary disease, unspecified: Secondary | ICD-10-CM | POA: Insufficient documentation

## 2011-06-28 DIAGNOSIS — Z794 Long term (current) use of insulin: Secondary | ICD-10-CM | POA: Insufficient documentation

## 2011-06-28 LAB — BASIC METABOLIC PANEL
BUN: 20 mg/dL (ref 6–23)
Chloride: 101 mEq/L (ref 96–112)
Creatinine, Ser: 0.98 mg/dL (ref 0.50–1.35)
GFR calc Af Amer: >60 mL/min (ref >60)
GFR calc non Af Amer: >60 mL/min (ref >60)
Glucose, Bld: 102 mg/dL — ABNORMAL HIGH (ref 70–99)
Potassium: 3.7 mEq/L (ref 3.5–5.1)

## 2011-06-28 LAB — CBC
MCHC: 33.4 g/dL (ref 30.0–36.0)
Platelets: 166 10*3/uL (ref 150–400)
RDW: 13.9 % (ref 11.5–15.5)
WBC: 8.2 10*3/uL (ref 4.0–10.5)

## 2011-06-28 LAB — CK TOTAL AND CKMB (NOT AT ARMC)
CK, MB: 3.1 ng/mL (ref 0.3–4.0)
Relative Index: 1.8 (ref 0.0–2.5)

## 2011-06-28 LAB — DIFFERENTIAL
Basophils Absolute: 0 10*3/uL (ref 0.0–0.1)
Eosinophils Absolute: 0.9 10*3/uL — ABNORMAL HIGH (ref 0.0–0.7)
Eosinophils Relative: 11 % — ABNORMAL HIGH (ref 0–5)
Monocytes Absolute: 0.7 10*3/uL (ref 0.1–1.0)

## 2011-07-21 ENCOUNTER — Emergency Department (HOSPITAL_COMMUNITY)
Admission: EM | Admit: 2011-07-21 | Discharge: 2011-07-22 | Disposition: A | Discharge status: Home / Self Care | Payer: Medicaid Other | Attending: Emergency Medicine | Admitting: Emergency Medicine

## 2011-07-21 ENCOUNTER — Other Ambulatory Visit: Payer: Self-pay

## 2011-07-21 ENCOUNTER — Encounter: Payer: Self-pay | Admitting: *Deleted

## 2011-07-21 DIAGNOSIS — F17200 Nicotine dependence, unspecified, uncomplicated: Secondary | ICD-10-CM | POA: Insufficient documentation

## 2011-07-21 DIAGNOSIS — I10 Essential (primary) hypertension: Secondary | ICD-10-CM | POA: Insufficient documentation

## 2011-07-21 DIAGNOSIS — E7800 Pure hypercholesterolemia, unspecified: Secondary | ICD-10-CM | POA: Insufficient documentation

## 2011-07-21 DIAGNOSIS — E119 Type 2 diabetes mellitus without complications: Secondary | ICD-10-CM | POA: Insufficient documentation

## 2011-07-21 DIAGNOSIS — R079 Chest pain, unspecified: Secondary | ICD-10-CM | POA: Insufficient documentation

## 2011-07-21 HISTORY — DX: Essential (primary) hypertension: I10

## 2011-07-21 HISTORY — DX: Pure hypercholesterolemia, unspecified: E78.00

## 2011-07-21 MED ORDER — ASPIRIN 325 MG PO TABS
325.0000 mg | ORAL_TABLET | ORAL | Status: AC
  Administered 2011-07-22: 325 mg via ORAL
  Filled 2011-07-21: qty 1

## 2011-07-21 MED ORDER — NITROGLYCERIN 0.4 MG SL SUBL
0.4000 mg | SUBLINGUAL_TABLET | SUBLINGUAL | Status: DC | PRN
  Administered 2011-07-22: 0.4 mg via SUBLINGUAL
  Filled 2011-07-21: qty 25

## 2011-07-21 NOTE — ED Notes (Signed)
Pt c/o chest pain radiating to back starting tonight.

## 2011-07-22 ENCOUNTER — Emergency Department (HOSPITAL_COMMUNITY): Payer: Medicaid Other

## 2011-07-22 LAB — CBC
HCT: 39.2 % (ref 39.0–52.0)
Hemoglobin: 13.5 g/dL (ref 13.0–17.0)
MCH: 31 pg (ref 26.0–34.0)
MCHC: 34.4 g/dL (ref 30.0–36.0)
RDW: 14.4 % (ref 11.5–15.5)

## 2011-07-22 LAB — BASIC METABOLIC PANEL
BUN: 16 mg/dL (ref 6–23)
Calcium: 9.1 mg/dL (ref 8.4–10.5)
GFR calc Af Amer: >60 mL/min (ref >60)
GFR calc non Af Amer: >60 mL/min (ref >60)
Glucose, Bld: 92 mg/dL (ref 70–99)
Potassium: 3.7 mEq/L (ref 3.5–5.1)

## 2011-07-22 NOTE — ED Provider Notes (Addendum)
History     Chief Complaint  Patient presents with  . Chest Pain   Patient is a 50 y.o. male presenting with chest pain. The history is provided by the patient.  Chest Pain The chest pain began less than 1 hour ago. Chest pain occurs constantly. The chest pain is improving. The pain is associated with coughing. At its most intense, the pain is at 4/10. The severity of the pain is mild. The quality of the pain is described as aching. The pain does not radiate. Pertinent negatives for primary symptoms include no shortness of breath, no cough and no palpitations. He tried nothing for the symptoms.     Past Medical History  Diagnosis Date  . Hypertension   . Diabetes mellitus   . High cholesterol     Past Surgical History  Procedure Date  . Tonsillectomy     Family History  Problem Relation Age of Onset  . Hypertension Mother   . Cancer Father     History  Substance Use Topics  . Smoking status: Current Everyday Smoker  . Smokeless tobacco: Not on file  . Alcohol Use: No      Review of Systems  Respiratory: Negative for cough and shortness of breath.   Cardiovascular: Positive for chest pain. Negative for palpitations.  All other systems reviewed and are negative.    Physical Exam  BP 164/96  Pulse 70  Temp(Src) 98.4 F (36.9 C) (Oral)  Resp 22  Ht 5\' 11"  (1.803 m)  Wt 230 lb (104.327 kg)  BMI 32.08 kg/m2  SpO2 96%  Physical Exam  Constitutional: He is oriented to person, place, and time. He appears well-developed and well-nourished.  HENT:  Head: Normocephalic.  Right Ear: External ear normal.  Nose: Nose normal.  Mouth/Throat: Oropharynx is clear and moist.  Eyes: EOM are normal. Pupils are equal, round, and reactive to light.  Neck: Normal range of motion. Neck supple.  Cardiovascular: Normal rate, regular rhythm, normal heart sounds and intact distal pulses.   Pulmonary/Chest: Effort normal and breath sounds normal.  Abdominal: Soft. Bowel sounds  are normal.  Musculoskeletal: Normal range of motion.  Neurological: He is alert and oriented to person, place, and time. He has normal reflexes.  Skin: Skin is warm and dry.  Psychiatric: He has a normal mood and affect. His behavior is normal. Judgment and thought content normal.    ED Course  Procedures  MDM  Reviewed labs, radiology results, EKG, nurse notes, vital signs. Reviewed results with patient. Pt feels improved after observation and/or treatment in ED.      Nicoletta Dress. Colon Branch, MD 07/22/11 0211   Date: 07/21/2011  23:37  Rate: 64  Rhythm: normal sinus rhythm  QRS Axis: normal  Intervals: normal  ST/T Wave abnormalities: nonspecific T wave changes  Conduction Disutrbances:none  Narrative Interpretation:   Old EKG Reviewed: unchanged   Nicoletta Dress. Colon Branch, MD 07/22/11 0830

## 2011-08-20 ENCOUNTER — Encounter (HOSPITAL_COMMUNITY): Payer: Self-pay

## 2011-08-20 ENCOUNTER — Emergency Department (HOSPITAL_COMMUNITY)
Admission: EM | Admit: 2011-08-20 | Discharge: 2011-08-20 | Disposition: A | Discharge status: Home / Self Care | Payer: Medicaid Other | Attending: Emergency Medicine | Admitting: Emergency Medicine

## 2011-08-20 ENCOUNTER — Emergency Department (HOSPITAL_COMMUNITY): Payer: Medicaid Other

## 2011-08-20 DIAGNOSIS — E119 Type 2 diabetes mellitus without complications: Secondary | ICD-10-CM | POA: Insufficient documentation

## 2011-08-20 DIAGNOSIS — Z79899 Other long term (current) drug therapy: Secondary | ICD-10-CM | POA: Insufficient documentation

## 2011-08-20 DIAGNOSIS — Z794 Long term (current) use of insulin: Secondary | ICD-10-CM | POA: Insufficient documentation

## 2011-08-20 DIAGNOSIS — F17200 Nicotine dependence, unspecified, uncomplicated: Secondary | ICD-10-CM | POA: Insufficient documentation

## 2011-08-20 DIAGNOSIS — Z7982 Long term (current) use of aspirin: Secondary | ICD-10-CM | POA: Insufficient documentation

## 2011-08-20 DIAGNOSIS — J069 Acute upper respiratory infection, unspecified: Secondary | ICD-10-CM | POA: Insufficient documentation

## 2011-08-20 DIAGNOSIS — I10 Essential (primary) hypertension: Secondary | ICD-10-CM | POA: Insufficient documentation

## 2011-08-20 LAB — DIFFERENTIAL
Basophils Relative: 0 % (ref 0–1)
Eosinophils Absolute: 1.3 10*3/uL — ABNORMAL HIGH (ref 0.0–0.7)
Eosinophils Relative: 15 % — ABNORMAL HIGH (ref 0–5)
Lymphs Abs: 2.1 10*3/uL (ref 0.7–4.0)

## 2011-08-20 LAB — CBC
MCH: 30.6 pg (ref 26.0–34.0)
MCHC: 33.4 g/dL (ref 30.0–36.0)
MCV: 91.6 fL (ref 78.0–100.0)
Platelets: 146 10*3/uL — ABNORMAL LOW (ref 150–400)
RBC: 4.38 MIL/uL (ref 4.22–5.81)
RDW: 15 % (ref 11.5–15.5)

## 2011-08-20 LAB — BASIC METABOLIC PANEL
Calcium: 8.9 mg/dL (ref 8.4–10.5)
GFR calc Af Amer: >60 mL/min (ref >60)
GFR calc non Af Amer: >60 mL/min (ref >60)
Glucose, Bld: 87 mg/dL (ref 70–99)
Sodium: 137 mEq/L (ref 135–145)

## 2011-08-20 NOTE — ED Notes (Signed)
Sick for 3 days w/ cough and congestion, thinks he may have pneumonia again, weak all over

## 2011-08-20 NOTE — ED Provider Notes (Signed)
History     CSN: [id number] Arrival date & time: 08/20/2011  1:59 AM Seen at 0159am Chief Complaint  Patient presents with  . Weakness   HPI Comments: Pt reports cough/nasal congestion for about 2 days Reports mild SOB Feels "Weak all over" thinks he has pneumonia as this is similar to prior episode No CP No new LE edema No recorded fever No focal weakness No abd pain   Patient is a 50 y.o. male presenting with cough.  Cough This is a new problem. The current episode started 2 days ago. The problem occurs every few hours. The problem has been gradually worsening. The cough is productive of sputum. There has been no fever. Associated symptoms include chills and shortness of breath. Pertinent negatives include no chest pain. His past medical history is significant for pneumonia.    Past Medical History  Diagnosis Date  . Hypertension   . Diabetes mellitus   . High cholesterol     Past Surgical History  Procedure Date  . Tonsillectomy     Family History  Problem Relation Age of Onset  . Hypertension Mother   . Cancer Father     History  Substance Use Topics  . Smoking status: Current Everyday Smoker  . Smokeless tobacco: Not on file  . Alcohol Use: No      Review of Systems  Constitutional: Positive for chills.  Respiratory: Positive for cough and shortness of breath.   Cardiovascular: Negative for chest pain.  All other systems reviewed and are negative.    Physical Exam  BP 149/91  Pulse 70  Temp(Src) 98.4 F (36.9 C) (Oral)  Resp 20  Ht 6\' 2"  (1.88 m)  Wt 230 lb (104.327 kg)  BMI 29.53 kg/m2  SpO2 98%  Physical Exam  CONSTITUTIONAL: Well developed/well nourished HEAD AND FACE: Normocephalic/atraumatic EYES: EOMI/PERRL ENMT: Mucous membranes moist, nasal congestion NECK: supple no meningeal signs CV: S1/S2 noted, no murmurs/rubs/gallops noted LUNGS: Lungs are clear to auscultation bilaterally, no apparent distress ABDOMEN: soft, nontender,  no rebound or guarding NEURO: Pt is awake/alert, moves all extremitiesx4, gait normal EXTREMITIES: pulses normal, full ROM, symmetric minimal pitting edema in the bilateral LE (chronic per patient) SKIN: warm, color normal PSYCH: flat affect   ED Course  Procedures  MDM Previous records reviewed and considered Nursing notes reviewed and considered in documentation All labs/vitals reviewed and considered xrays reviewed and considered   Pt with cough/congestion/fatigue for 2 days.  He is well appearing, afebrille, no hypoxia BP 149/91  Pulse 70  Temp(Src) 98.4 F (36.9 C) (Oral)  Resp 20  Ht 6\' 2"  (1.88 m)  Wt 230 lb (104.327 kg)  BMI 29.53 kg/m2  SpO2 98% Will check cxr and given comorbidities will check labs Pt refused to have EKG performed, reports the stickers hurts his chest  2:31 AM  3:06 AM Pt well appearing, watching TV, no distress, lung sounds clear He denies current CP/SOB He is most concerned with his nasal congestion Stable for d/c  Joya Gaskins, MD 08/20/11 [phone]

## 2011-08-20 NOTE — ED Notes (Addendum)
Pt refused ekg stating it leaves black spots on his chest & pulls his hair, edp notified of pt refusing ekg.

## 2011-08-20 NOTE — ED Notes (Signed)
Generalized weakness

## 2011-09-12 LAB — DIFFERENTIAL
Basophils Relative: 0
Eosinophils Absolute: 0.1
Eosinophils Relative: 2
Lymphs Abs: 0.6 — ABNORMAL LOW
Monocytes Absolute: 0.4
Monocytes Relative: 6
Neutrophils Relative %: 82 — ABNORMAL HIGH

## 2011-09-12 LAB — CBC
HCT: 39.4
Hemoglobin: 13.2
MCHC: 33.5
MCV: 85.4
RBC: 4.62

## 2011-09-12 LAB — BASIC METABOLIC PANEL
CO2: 25
Creatinine, Ser: 0.96
Sodium: 133 — ABNORMAL LOW

## 2011-09-17 LAB — BASIC METABOLIC PANEL
CO2: 31
Chloride: 100
GFR calc Af Amer: >60
Potassium: 3.8
Sodium: 137

## 2011-09-17 LAB — DIFFERENTIAL
Basophils Absolute: 0.1
Eosinophils Relative: 2
Lymphocytes Relative: 18
Monocytes Relative: 9
Neutro Abs: 5.7

## 2011-09-17 LAB — RAPID URINE DRUG SCREEN, HOSP PERFORMED
Amphetamines: NOT DETECTED
Barbiturates: NOT DETECTED
Benzodiazepines: NOT DETECTED
Cocaine: NOT DETECTED

## 2011-09-17 LAB — CBC
Hemoglobin: 13.4
RDW: 14.2
WBC: 8.2

## 2011-09-17 LAB — ETHANOL: Alcohol, Ethyl (B): <5

## 2011-09-18 LAB — CBC
HCT: 38.2 — ABNORMAL LOW
MCV: 85.6
Platelets: 164
RDW: 14.6

## 2011-09-18 LAB — COMPREHENSIVE METABOLIC PANEL
Albumin: 3.7
BUN: 13
Calcium: 9.1
Creatinine, Ser: 1.05
Total Protein: 6.9

## 2011-09-18 LAB — DIFFERENTIAL
Basophils Absolute: 0
Lymphocytes Relative: 16
Lymphs Abs: 1.4
Monocytes Absolute: 0.7
Monocytes Relative: 8
Neutro Abs: 6.2

## 2011-09-20 LAB — URINALYSIS, ROUTINE W REFLEX MICROSCOPIC
Glucose, UA: NEGATIVE
Specific Gravity, Urine: 1.015
pH: 6.5

## 2011-09-20 LAB — CBC
HCT: 37.5 — ABNORMAL LOW
MCV: 85.9
Platelets: 152
WBC: 6.9

## 2011-09-20 LAB — RAPID URINE DRUG SCREEN, HOSP PERFORMED
Barbiturates: NOT DETECTED
Benzodiazepines: NOT DETECTED
Cocaine: NOT DETECTED
Opiates: NOT DETECTED

## 2011-09-20 LAB — DIFFERENTIAL
Eosinophils Absolute: 0.2
Eosinophils Relative: 3
Lymphs Abs: 1.5

## 2011-09-20 LAB — BASIC METABOLIC PANEL
BUN: 17
Chloride: 104
Glucose, Bld: 96
Potassium: 3.2 — ABNORMAL LOW

## 2011-09-20 LAB — POCT CARDIAC MARKERS: Operator id: 189501

## 2011-09-24 LAB — CBC
Hemoglobin: 12.1 — ABNORMAL LOW
Hemoglobin: 13.6
MCHC: 33.7
MCHC: 33.8
MCHC: 34.2
MCHC: 33.3
MCV: 85.7
MCV: 86.8
MCV: 86.5
RBC: 4.18 — ABNORMAL LOW
RBC: 4.65
RBC: 3.86 — ABNORMAL LOW
RDW: 14
RDW: 14.6
RDW: 15.1
WBC: 13 — ABNORMAL HIGH

## 2011-09-24 LAB — GLUCOSE, CAPILLARY
Glucose-Capillary: 158 — ABNORMAL HIGH
Glucose-Capillary: 183 — ABNORMAL HIGH
Glucose-Capillary: 212 — ABNORMAL HIGH
Glucose-Capillary: 244 — ABNORMAL HIGH
Glucose-Capillary: 253 — ABNORMAL HIGH
Glucose-Capillary: 287 — ABNORMAL HIGH
Glucose-Capillary: 395 — ABNORMAL HIGH
Glucose-Capillary: 197 — ABNORMAL HIGH
Glucose-Capillary: 232 — ABNORMAL HIGH
Glucose-Capillary: 240 — ABNORMAL HIGH
Glucose-Capillary: 248 — ABNORMAL HIGH

## 2011-09-24 LAB — CSF CELL COUNT WITH DIFFERENTIAL
Eosinophils, CSF: NONE SEEN
Eosinophils, CSF: NONE SEEN
Lymphs, CSF: NONE SEEN
Monocyte-Macrophage-Spinal Fluid: NONE SEEN
Monocyte-Macrophage-Spinal Fluid: NONE SEEN
Tube #: 4

## 2011-09-24 LAB — COMPREHENSIVE METABOLIC PANEL WITH GFR
ALT: 63 — ABNORMAL HIGH
AST: 58 — ABNORMAL HIGH
Albumin: 2.1 — ABNORMAL LOW
Alkaline Phosphatase: 105
BUN: 8
CO2: 29
Calcium: 8.7
Chloride: 102
Creatinine, Ser: 0.9
GFR calc non Af Amer: >60
Glucose, Bld: 203 — ABNORMAL HIGH
Potassium: 3.6
Sodium: 138
Total Bilirubin: 0.7
Total Protein: 5.9 — ABNORMAL LOW

## 2011-09-24 LAB — DIFFERENTIAL
Basophils Absolute: 0
Basophils Absolute: 0
Basophils Relative: 0
Basophils Relative: 0
Eosinophils Absolute: 0
Eosinophils Absolute: 0.1
Eosinophils Relative: 1
Lymphocytes Relative: 4 — ABNORMAL LOW
Lymphs Abs: 0.5 — ABNORMAL LOW
Monocytes Absolute: 0.6
Monocytes Absolute: 0.8
Monocytes Absolute: 1
Monocytes Relative: 5
Monocytes Relative: 6
Neutro Abs: 11.4 — ABNORMAL HIGH
Neutro Abs: 11.7 — ABNORMAL HIGH
Neutrophils Relative %: 85 — ABNORMAL HIGH
Neutrophils Relative %: 90 — ABNORMAL HIGH

## 2011-09-24 LAB — URINALYSIS, ROUTINE W REFLEX MICROSCOPIC
Nitrite: NEGATIVE
Protein, ur: 100 — AB
Specific Gravity, Urine: 1.025
Urobilinogen, UA: 2 — ABNORMAL HIGH

## 2011-09-24 LAB — URINE CULTURE

## 2011-09-24 LAB — BASIC METABOLIC PANEL
CO2: 25
CO2: 28
CO2: 29
Calcium: 8.4
Calcium: 9.3
Chloride: 94 — ABNORMAL LOW
Chloride: 99
Creatinine, Ser: 1.05
GFR calc Af Amer: >60
GFR calc Af Amer: >60
Glucose, Bld: 165 — ABNORMAL HIGH
Glucose, Bld: 178 — ABNORMAL HIGH
Glucose, Bld: 322 — ABNORMAL HIGH
Potassium: 3.4 — ABNORMAL LOW
Sodium: 131 — ABNORMAL LOW
Sodium: 135

## 2011-09-24 LAB — GRAM STAIN: Gram Stain: NONE SEEN

## 2011-09-24 LAB — CULTURE, BLOOD (ROUTINE X 2)
Culture: NO GROWTH
Report Status: 10012009

## 2011-09-24 LAB — CSF CULTURE W GRAM STAIN

## 2011-09-24 LAB — GLUCOSE, CSF: Glucose, CSF: 103 — ABNORMAL HIGH

## 2011-09-24 LAB — PROTEIN, CSF: Total  Protein, CSF: 63 — ABNORMAL HIGH

## 2011-09-24 LAB — URINE MICROSCOPIC-ADD ON

## 2011-09-25 LAB — CBC
Platelets: 267
RDW: 15.2

## 2011-09-25 LAB — DIFFERENTIAL
Basophils Relative: 0
Eosinophils Absolute: 0.3
Eosinophils Relative: 2
Monocytes Absolute: 1.2 — ABNORMAL HIGH
Monocytes Relative: 10
Neutrophils Relative %: 73

## 2011-09-25 LAB — GLUCOSE, CAPILLARY
Glucose-Capillary: 269 — ABNORMAL HIGH
Glucose-Capillary: 27 — CL

## 2014-08-03 ENCOUNTER — Ambulatory Visit: Payer: Self-pay | Admitting: Podiatrist

## 2014-12-04 ENCOUNTER — Emergency Department: Payer: Self-pay | Admitting: Emergency Medicine

## 2014-12-04 LAB — BASIC METABOLIC PANEL
Anion Gap: 6 — ABNORMAL LOW (ref 7–16)
BUN: 8 mg/dL (ref 7–18)
CHLORIDE: 104 mmol/L (ref 98–107)
CO2: 31 mmol/L (ref 21–32)
Calcium, Total: 9.2 mg/dL (ref 8.5–10.1)
Creatinine: 1 mg/dL (ref 0.60–1.30)
EGFR (African American): >60
GLUCOSE: 137 mg/dL — AB (ref 65–99)
Osmolality: 282 (ref 275–301)
Potassium: 4.3 mmol/L (ref 3.5–5.1)
Sodium: 141 mmol/L (ref 136–145)

## 2014-12-04 LAB — CBC WITH DIFFERENTIAL/PLATELET
BASOS PCT: 1.9 %
Basophil #: 0.2 10*3/uL — ABNORMAL HIGH (ref 0.0–0.1)
EOS PCT: 16.9 %
Eosinophil #: 1.5 10*3/uL — ABNORMAL HIGH (ref 0.0–0.7)
HCT: 47.1 % (ref 40.0–52.0)
HGB: 15.3 g/dL (ref 13.0–18.0)
LYMPHS ABS: 1.7 10*3/uL (ref 1.0–3.6)
LYMPHS PCT: 19.1 %
MCH: 30.2 pg (ref 26.0–34.0)
MCHC: 32.6 g/dL (ref 32.0–36.0)
MCV: 93 fL (ref 80–100)
MONO ABS: 0.8 x10 3/mm (ref 0.2–1.0)
MONOS PCT: 9 %
NEUTROS ABS: 4.7 10*3/uL (ref 1.4–6.5)
NEUTROS PCT: 53.1 %
Platelet: 150 10*3/uL (ref 150–440)
RBC: 5.08 10*6/uL (ref 4.40–5.90)
RDW: 15.1 % — AB (ref 11.5–14.5)
WBC: 9.3 10*3/uL (ref 3.8–10.6)

## 2015-07-25 ENCOUNTER — Ambulatory Visit: Payer: Self-pay | Admitting: Podiatry

## 2015-12-08 ENCOUNTER — Emergency Department (HOSPITAL_COMMUNITY)
Admission: EM | Admit: 2015-12-08 | Discharge: 2015-12-08 | Discharge status: Against Medical Advice | Payer: Medicaid Other | Attending: Emergency Medicine | Admitting: Emergency Medicine

## 2015-12-08 ENCOUNTER — Encounter (HOSPITAL_COMMUNITY): Payer: Self-pay | Admitting: Emergency Medicine

## 2015-12-08 DIAGNOSIS — Z7984 Long term (current) use of oral hypoglycemic drugs: Secondary | ICD-10-CM | POA: Insufficient documentation

## 2015-12-08 DIAGNOSIS — G629 Polyneuropathy, unspecified: Secondary | ICD-10-CM | POA: Diagnosis not present

## 2015-12-08 DIAGNOSIS — Z794 Long term (current) use of insulin: Secondary | ICD-10-CM | POA: Insufficient documentation

## 2015-12-08 DIAGNOSIS — R252 Cramp and spasm: Secondary | ICD-10-CM | POA: Insufficient documentation

## 2015-12-08 DIAGNOSIS — F17200 Nicotine dependence, unspecified, uncomplicated: Secondary | ICD-10-CM | POA: Insufficient documentation

## 2015-12-08 DIAGNOSIS — E1165 Type 2 diabetes mellitus with hyperglycemia: Secondary | ICD-10-CM | POA: Diagnosis not present

## 2015-12-08 DIAGNOSIS — Z79899 Other long term (current) drug therapy: Secondary | ICD-10-CM | POA: Insufficient documentation

## 2015-12-08 DIAGNOSIS — Z8659 Personal history of other mental and behavioral disorders: Secondary | ICD-10-CM | POA: Insufficient documentation

## 2015-12-08 DIAGNOSIS — I10 Essential (primary) hypertension: Secondary | ICD-10-CM | POA: Insufficient documentation

## 2015-12-08 DIAGNOSIS — R739 Hyperglycemia, unspecified: Secondary | ICD-10-CM

## 2015-12-08 DIAGNOSIS — E1140 Type 2 diabetes mellitus with diabetic neuropathy, unspecified: Secondary | ICD-10-CM | POA: Insufficient documentation

## 2015-12-08 DIAGNOSIS — E7800 Pure hypercholesterolemia, unspecified: Secondary | ICD-10-CM | POA: Insufficient documentation

## 2015-12-08 DIAGNOSIS — Z7982 Long term (current) use of aspirin: Secondary | ICD-10-CM | POA: Diagnosis not present

## 2015-12-08 HISTORY — DX: Schizophrenia, unspecified: F20.9

## 2015-12-08 LAB — CBG MONITORING, ED

## 2015-12-08 MED ORDER — SODIUM CHLORIDE 0.9 % IV SOLN: INTRAVENOUS | Status: DC

## 2015-12-08 MED ORDER — SODIUM CHLORIDE 0.9 % IV BOLUS (SEPSIS): 2000.0000 mL | Freq: Once | INTRAVENOUS | Status: DC

## 2015-12-08 NOTE — ED Notes (Signed)
PT would not allow me to do EKG right now since he is talking to his girlfriend. PT state he is ready to go do not want anyone taking blood or anything

## 2015-12-08 NOTE — ED Notes (Signed)
Pt called out to EMS for L leg cramping. Pt has been out of gabapentin for several days. When EMS checked CBG it read "high." Pt on metformin for DM; has been out for the past 2 days.

## 2015-12-08 NOTE — ED Notes (Signed)
Pt refused blood draw and EKG. Pt said he did not want to be stuck. Explained I can get blood off existing IV. Pt still refused blood draw and said he wanted to leave AMA. MD informed and will speak with pt.

## 2015-12-08 NOTE — ED Notes (Addendum)
MD at bedside explaining risks of leaving AMA. Pt had 1L NS started by EMS [facility]

## 2015-12-08 NOTE — ED Provider Notes (Signed)
CSN: [id number]     Arrival date & time 12/08/15  1326 History   First MD Initiated Contact with Patient 12/08/15 1331     Chief Complaint  Patient presents with  . Hyperglycemia     (Consider location/radiation/quality/duration/timing/severity/associated sxs/prior Treatment) HPI Comments: Patient here complaining of left leg cramping times several days. He has been out of his gabapentin. Does have a history of peripheral neuropathy secondary to diabetes. Patient is also a diabetic and has been out of his metformin for several days. When EMS arrived, the patient's CBG read high. Patient endorses polyuria and polydipsia as well as some diffuse weakness. Denies any emesis. No palpitations. No chest or abdominal discomfort. Left leg cramping is localized to his calf and has been for quite some time. Denies any calf swelling. Patient transported here with IV fluids  Patient is a 54 y.o. male presenting with hyperglycemia. The history is provided by the patient.  Hyperglycemia   Past Medical History  Diagnosis Date  . Hypertension   . Diabetes mellitus   . High cholesterol   . Schizophrenia [facility])    Past Surgical History  Procedure Laterality Date  . Tonsillectomy     Family History  Problem Relation Age of Onset  . Hypertension Mother   . Cancer Father    Social History  Substance Use Topics  . Smoking status: Current Every Day Smoker  . Smokeless tobacco: None  . Alcohol Use: No    Review of Systems  All other systems reviewed and are negative.     Allergies  Thorazine  Home Medications   Prior to Admission medications   Medication Sig Start Date End Date Taking? Authorizing Provider  Albuterol Sulfate (VENTOLIN HFA IN) Inhale into the lungs.      Historical Provider, MD  amLODipine (NORVASC) 5 MG tablet Take 5 mg by mouth daily.      Historical Provider, MD  aspirin 81 MG tablet Take 81 mg by mouth daily.      Historical Provider, MD  divalproex (DEPAKOTE) 500 MG  24 hr tablet Take 500 mg by mouth daily.      Historical Provider, MD  ferrous sulfate 325 (65 FE) MG tablet Take 325 mg by mouth 2 (two) times daily.     Historical Provider, MD  gabapentin (NEURONTIN) 300 MG capsule Take 300 mg by mouth 2 (two) times daily.     Historical Provider, MD  glipiZIDE (GLUCOTROL) 10 MG tablet Take 10 mg by mouth 2 (two) times daily before a meal.      Historical Provider, MD  haloperidol (HALDOL) 5 MG tablet Take 5 mg by mouth 2 (two) times daily.      Historical Provider, MD  haloperidol decanoate (HALDOL DECANOATE) 100 MG/ML injection Inject 100 mg into the muscle every 28 (twenty-eight) days.      Historical Provider, MD  ibuprofen (ADVIL,MOTRIN) 600 MG tablet Take 600 mg by mouth every 6 (six) hours as needed.      Historical Provider, MD  insulin lispro (HUMALOG) 100 UNIT/ML injection Inject 5 Units into the skin 3 (three) times daily before meals.      Historical Provider, MD  losartan (COZAAR) 50 MG tablet Take 50 mg by mouth daily.      Historical Provider, MD  metFORMIN (GLUCOPHAGE) 1000 MG tablet Take 1,000 mg by mouth 2 (two) times daily with a meal.      Historical Provider, MD  metoprolol (TOPROL-XL) 50 MG 24 hr tablet Take 50 mg  by mouth 2 (two) times daily.     Historical Provider, MD  simvastatin (ZOCOR) 20 MG tablet Take 20 mg by mouth at bedtime.      Historical Provider, MD  trihexyphenidyl (ARTANE) 5 MG tablet Take 5 mg by mouth 3 (three) times daily with meals.      Historical Provider, MD   BP 144/88 mmHg  Pulse 96  Resp 16  SpO2 99% Physical Exam  Constitutional: He is oriented to person, place, and time. He appears well-developed and well-nourished.  Non-toxic appearance. No distress.  HENT:  Head: Normocephalic and atraumatic.  Eyes: Conjunctivae, EOM and lids are normal. Pupils are equal, round, and reactive to light.  Neck: Normal range of motion. Neck supple. No tracheal deviation present. No thyroid mass present.  Cardiovascular:  Normal rate, regular rhythm and normal heart sounds.  Exam reveals no gallop.   No murmur heard. Pulmonary/Chest: Effort normal and breath sounds normal. No stridor. No respiratory distress. He has no decreased breath sounds. He has no wheezes. He has no rhonchi. He has no rales.  Abdominal: Soft. Normal appearance and bowel sounds are normal. He exhibits no distension. There is no tenderness. There is no rebound and no CVA tenderness.  Musculoskeletal: Normal range of motion. He exhibits no edema or tenderness.       Legs: Neurological: He is alert and oriented to person, place, and time. He has normal strength. No cranial nerve deficit or sensory deficit. GCS eye subscore is 4. GCS verbal subscore is 5. GCS motor subscore is 6.  Skin: Skin is warm and dry. No abrasion and no rash noted.  Psychiatric: He has a normal mood and affect. His speech is normal and behavior is normal.  Nursing note and vitals reviewed.   ED Course  Procedures (including critical care time) Labs Review Labs Reviewed  CBG MONITORING, ED - Abnormal; Notable for the following:    Glucose-Capillary >600 (*)    All other components within normal limits  CBC WITH DIFFERENTIAL/PLATELET  URINALYSIS, ROUTINE W REFLEX MICROSCOPIC (NOT AT [facility])  BASIC METABOLIC PANEL  BLOOD GAS, VENOUS    Imaging Review No results found. I have personally reviewed and evaluated these images and lab results as part of my medical decision-making.   EKG Interpretation None      MDM   Final diagnoses:  None    Patient's blood sugar noted to be above 600. He is refusing treatment at this time. Risk of death explained to him and he accepts this and has the capacity to make his decision. Encouraged to return should change his mind    Lorre NickAnthony Lashea Goda, MD 12/08/15 1406

## 2015-12-08 NOTE — ED Notes (Signed)
Bed: [id number] Expected date:  Expected time:  Means of arrival:  Comments: hyperglycemia

## 2015-12-30 ENCOUNTER — Emergency Department (HOSPITAL_COMMUNITY)
Admission: EM | Admit: 2015-12-30 | Discharge: 2015-12-30 | Disposition: A | Discharge status: Home / Self Care | Payer: Medicaid Other | Attending: Emergency Medicine | Admitting: Emergency Medicine

## 2015-12-30 ENCOUNTER — Encounter (HOSPITAL_COMMUNITY): Payer: Self-pay | Admitting: Emergency Medicine

## 2015-12-30 DIAGNOSIS — E1140 Type 2 diabetes mellitus with diabetic neuropathy, unspecified: Secondary | ICD-10-CM | POA: Diagnosis not present

## 2015-12-30 DIAGNOSIS — Z79899 Other long term (current) drug therapy: Secondary | ICD-10-CM | POA: Diagnosis not present

## 2015-12-30 DIAGNOSIS — T783XXA Angioneurotic edema, initial encounter: Secondary | ICD-10-CM

## 2015-12-30 DIAGNOSIS — T7840XA Allergy, unspecified, initial encounter: Secondary | ICD-10-CM | POA: Diagnosis present

## 2015-12-30 DIAGNOSIS — Z8619 Personal history of other infectious and parasitic diseases: Secondary | ICD-10-CM | POA: Diagnosis not present

## 2015-12-30 DIAGNOSIS — F17200 Nicotine dependence, unspecified, uncomplicated: Secondary | ICD-10-CM | POA: Insufficient documentation

## 2015-12-30 DIAGNOSIS — Z794 Long term (current) use of insulin: Secondary | ICD-10-CM | POA: Insufficient documentation

## 2015-12-30 DIAGNOSIS — E7800 Pure hypercholesterolemia, unspecified: Secondary | ICD-10-CM | POA: Diagnosis not present

## 2015-12-30 DIAGNOSIS — Z8659 Personal history of other mental and behavioral disorders: Secondary | ICD-10-CM | POA: Insufficient documentation

## 2015-12-30 DIAGNOSIS — I10 Essential (primary) hypertension: Secondary | ICD-10-CM | POA: Insufficient documentation

## 2015-12-30 DIAGNOSIS — Z7984 Long term (current) use of oral hypoglycemic drugs: Secondary | ICD-10-CM | POA: Insufficient documentation

## 2015-12-30 DIAGNOSIS — T464X5A Adverse effect of angiotensin-converting-enzyme inhibitors, initial encounter: Secondary | ICD-10-CM | POA: Diagnosis not present

## 2015-12-30 DIAGNOSIS — Z7982 Long term (current) use of aspirin: Secondary | ICD-10-CM | POA: Insufficient documentation

## 2015-12-30 HISTORY — DX: Type 2 diabetes mellitus with diabetic neuropathy, unspecified: E11.40

## 2015-12-30 MED ORDER — FAMOTIDINE IN NACL 20-0.9 MG/50ML-% IV SOLN
20.0000 mg | Freq: Once | INTRAVENOUS | Status: AC
  Administered 2015-12-30: 20 mg via INTRAVENOUS
  Filled 2015-12-30: qty 50

## 2015-12-30 MED ORDER — METHYLPREDNISOLONE SODIUM SUCC 125 MG IJ SOLR
125.0000 mg | Freq: Once | INTRAMUSCULAR | Status: AC
  Administered 2015-12-30: 125 mg via INTRAVENOUS
  Filled 2015-12-30: qty 2

## 2015-12-30 NOTE — ED Provider Notes (Signed)
CSN: [id number]     Arrival date & time 12/30/15  1429 History   First MD Initiated Contact with Patient 12/30/15 1522     Chief Complaint  Patient presents with  . Allergic Reaction     (Consider location/radiation/quality/duration/timing/severity/associated sxs/prior Treatment) Patient is a 55 y.o. male presenting with allergic reaction. The history is provided by the patient. No language interpreter was used.  Allergic Reaction Presenting symptoms: difficulty swallowing and swelling   Presenting symptoms: no difficulty breathing, no itching and no wheezing   Severity:  Moderate Prior allergic episodes:  No prior episodes Context: medications   Context: no animal exposure   Relieved by:  Nothing Worsened by:  Nothing tried Ineffective treatments:  Epinephrine   Past Medical History  Diagnosis Date  . Hypertension   . Diabetes mellitus   . High cholesterol   . Schizophrenia (HCC)   . Diabetic neuropathy [facility](HCC)    Past Surgical History  Procedure Laterality Date  . Tonsillectomy     Family History  Problem Relation Age of Onset  . Hypertension Mother   . Cancer Father    Social History  Substance Use Topics  . Smoking status: Current Every Day Smoker  . Smokeless tobacco: None  . Alcohol Use: No    Review of Systems  HENT: Positive for trouble swallowing.   Respiratory: Negative for wheezing.   Skin: Negative for itching.  All other systems reviewed and are negative.     Allergies  Diphenhydramine; Haldol; and Thorazine  Home Medications   Prior to Admission medications   Medication Sig Start Date End Date Taking? Authorizing Provider  Albuterol Sulfate (VENTOLIN HFA IN) Inhale into the lungs.      Historical Provider, MD  amLODipine (NORVASC) 5 MG tablet Take 5 mg by mouth daily.      Historical Provider, MD  aspirin 81 MG tablet Take 81 mg by mouth daily.      Historical Provider, MD  divalproex (DEPAKOTE) 500 MG 24 hr tablet Take 500 mg by mouth  daily.      Historical Provider, MD  ferrous sulfate 325 (65 FE) MG tablet Take 325 mg by mouth 2 (two) times daily.     Historical Provider, MD  gabapentin (NEURONTIN) 300 MG capsule Take 300 mg by mouth 2 (two) times daily.     Historical Provider, MD  glipiZIDE (GLUCOTROL) 10 MG tablet Take 10 mg by mouth 2 (two) times daily before a meal.      Historical Provider, MD  haloperidol (HALDOL) 5 MG tablet Take 5 mg by mouth 2 (two) times daily.      Historical Provider, MD  haloperidol decanoate (HALDOL DECANOATE) 100 MG/ML injection Inject 100 mg into the muscle every 28 (twenty-eight) days.      Historical Provider, MD  ibuprofen (ADVIL,MOTRIN) 600 MG tablet Take 600 mg by mouth every 6 (six) hours as needed.      Historical Provider, MD  insulin lispro (HUMALOG) 100 UNIT/ML injection Inject 5 Units into the skin 3 (three) times daily before meals.      Historical Provider, MD  losartan (COZAAR) 50 MG tablet Take 50 mg by mouth daily.      Historical Provider, MD  metFORMIN (GLUCOPHAGE) 1000 MG tablet Take 1,000 mg by mouth 2 (two) times daily with a meal.      Historical Provider, MD  metoprolol (TOPROL-XL) 50 MG 24 hr tablet Take 50 mg by mouth 2 (two) times daily.     Historical  Provider, MD  simvastatin (ZOCOR) 20 MG tablet Take 20 mg by mouth at bedtime.      Historical Provider, MD  trihexyphenidyl (ARTANE) 5 MG tablet Take 5 mg by mouth 3 (three) times daily with meals.      Historical Provider, MD   BP 151/92 mmHg  Pulse 92  Temp(Src) 98.1 F (36.7 C) (Oral)  Resp 16  SpO2 97% Physical Exam  Constitutional: He is oriented to person, place, and time. He appears well-developed.  HENT:  Head: Normocephalic and atraumatic.  Swelling left upper lip,   Mouth clear,  Throat no edema  Eyes: Pupils are equal, round, and reactive to light.  Neck: Normal range of motion.  Cardiovascular: Normal heart sounds.   Pulmonary/Chest: Effort normal.  Neurological: He is alert and oriented to  person, place, and time. He has normal reflexes.  Skin: Skin is warm.  Nursing note and vitals reviewed.   ED Course  Procedures (including critical care time) Labs Review Labs Reviewed - No data to display  Imaging Review No results found. I have personally reviewed and evaluated these images and lab results as part of my medical decision-making.   EKG Interpretation None      MDM Pt is on lisinopril.   I suspect angioedema second to Lisinopril.   Pt has clear airway and normal breathing.  On reexam.  Throat is clear, no increased swelling.  Pt is demanded to leave.  Refuses further evaluation/observation.  Pt appears stable.    Final diagnoses:  Angioedema of lips, initial encounter    Pt given pepcid and solumedrol.   Pt counseled that he needs to stop lisinopril.  Pt angry about time in ED and that medication caused reaction.   Pt counseled that medications can have side effects.     Lonia Skinner Badger, PA-C 12/30/15 1653  Tilden Fossa, MD 12/31/15 1249

## 2015-12-30 NOTE — Discharge Instructions (Signed)
Angioedema  Angioedema is a sudden swelling of tissues, often of the skin. It can occur on the face or genitals or in the abdomen or other body parts. The swelling usually develops over a short period and gets better in 24 to 48 hours. It often begins during the night and is found when the person wakes up. The person may also get red, itchy patches of skin (hives). Angioedema can be dangerous if it involves swelling of the air passages.   Depending on the cause, episodes of angioedema may only happen once, come back in unpredictable patterns, or repeat for several years and then gradually fade away.   CAUSES   Angioedema can be caused by an allergic reaction to various triggers. It can also result from nonallergic causes, including reactions to drugs, immune system disorders, viral infections, or an abnormal gene that is passed to you from your parents (hereditary). For some people with angioedema, the cause is unknown.   Some things that can trigger angioedema include:    Foods.    Medicines, such as ACE inhibitors, ARBs, nonsteroidal anti-inflammatory agents, or estrogen.    Latex.    Animal saliva.    Insect stings.    Dyes used in X-rays.    Mild injury.    Dental work.   Surgery.   Stress.    Sudden changes in temperature.    Exercise.  SIGNS AND SYMPTOMS    Swelling of the skin.   Hives. If these are present, there is also intense itching.   Redness in the affected area.    Pain in the affected area.   Swollen lips or tongue.   Breathing problems. This may happen if the air passages swell.   Wheezing.  If internal organs are involved, there may be:    Nausea.    Abdominal pain.    Vomiting.    Difficulty swallowing.    Difficulty passing urine.  DIAGNOSIS    Your health care provider will examine the affected area and take a medical and family history.   Various tests may be done to help determine the cause. Tests may include:   Allergy skin tests to see if the problem  is an allergic reaction.    Blood tests to check for hereditary angioedema.    Tests to check for underlying diseases that could cause the condition.    A review of your medicines, including over-the-counter medicines, may be done.  TREATMENT   Treatment will depend on the cause of the angioedema. Possible treatments include:    Removal of anything that triggered the condition (such as stopping certain medicines).    Medicines to treat symptoms or prevent attacks. Medicines given may include:     Antihistamines.     Epinephrine injection.     Steroids.    Hospitalization may be required for severe attacks. If the air passages are affected, it can be an emergency. Tubes may need to be placed to keep the airway open.  HOME CARE INSTRUCTIONS    Take all medicines as directed by your health care provider.   If you were given medicines for emergency allergy treatment, always carry them with you.   Wear a medical bracelet as directed by your health care provider.    Avoid known triggers.  SEEK MEDICAL CARE IF:    You have repeat attacks of angioedema.    Your attacks are more frequent or more severe despite preventive measures.    You have hereditary angioedema   and are considering having children. It is important to discuss with your health care provider the risks of passing the condition on to your children.  SEEK IMMEDIATE MEDICAL CARE IF:    You have severe swelling of the mouth, tongue, or lips.   You have difficulty breathing.    You have difficulty swallowing.    You faint.  MAKE SURE YOU:   Understand these instructions.   Will watch your condition.   Will get help right away if you are not doing well or get worse.     This information is not intended to replace advice given to you by your health care provider. Make sure you discuss any questions you have with your health care provider.     Document Released: 02/18/2002 Document Revised: 12/31/2014 Document Reviewed:  08/03/2013  Elsevier Interactive Patient Education 2016 [employer]

## 2015-12-30 NOTE — ED Notes (Addendum)
PA at bedside. Explained pt needs to stop lisinopril and needs to follow up with PCP. Pt upset he was initially prescribed lisinopril.

## 2015-12-30 NOTE — ED Notes (Signed)
Pt noticed L lip and cheek swelling this am during regular check up. Pt reports he was given a shot at his pcp office, but he is unsure what it was. Swelling gradually became worse and pt was advised to come to the ED. Pt started on lisinopril 2 months ago. Also reports an allergy to benedryl which causes GI upset and agitation.

## 2015-12-30 NOTE — ED Notes (Signed)
Pt ask how long do he have to be here in the ED, he does not have a doctor nor nurse here he is ready to leave now. Unable to update vitals

## 2015-12-30 NOTE — ED Notes (Signed)
Bed: [id number] Expected date:  Expected time:  Means of arrival:  Comments: EMS- Angioedema

## 2016-05-01 ENCOUNTER — Emergency Department (HOSPITAL_COMMUNITY)
Admission: EM | Admit: 2016-05-01 | Discharge: 2016-05-07 | Disposition: A | Discharge status: Home / Self Care | Payer: Medicaid Other | Attending: Emergency Medicine | Admitting: Emergency Medicine

## 2016-05-01 ENCOUNTER — Encounter (HOSPITAL_COMMUNITY): Payer: Self-pay | Admitting: *Deleted

## 2016-05-01 DIAGNOSIS — Z7982 Long term (current) use of aspirin: Secondary | ICD-10-CM | POA: Insufficient documentation

## 2016-05-01 DIAGNOSIS — Z79899 Other long term (current) drug therapy: Secondary | ICD-10-CM | POA: Insufficient documentation

## 2016-05-01 DIAGNOSIS — F17200 Nicotine dependence, unspecified, uncomplicated: Secondary | ICD-10-CM | POA: Diagnosis not present

## 2016-05-01 DIAGNOSIS — Z794 Long term (current) use of insulin: Secondary | ICD-10-CM | POA: Diagnosis not present

## 2016-05-01 DIAGNOSIS — E7800 Pure hypercholesterolemia, unspecified: Secondary | ICD-10-CM | POA: Insufficient documentation

## 2016-05-01 DIAGNOSIS — E119 Type 2 diabetes mellitus without complications: Secondary | ICD-10-CM | POA: Insufficient documentation

## 2016-05-01 DIAGNOSIS — I10 Essential (primary) hypertension: Secondary | ICD-10-CM | POA: Diagnosis not present

## 2016-05-01 DIAGNOSIS — F200 Paranoid schizophrenia: Secondary | ICD-10-CM | POA: Diagnosis not present

## 2016-05-01 DIAGNOSIS — R739 Hyperglycemia, unspecified: Secondary | ICD-10-CM | POA: Insufficient documentation

## 2016-05-01 DIAGNOSIS — F209 Schizophrenia, unspecified: Secondary | ICD-10-CM | POA: Diagnosis present

## 2016-05-01 LAB — CBC
HCT: 42.1 % (ref 39.0–52.0)
Hemoglobin: 14.7 g/dL (ref 13.0–17.0)
MCH: 28.9 pg (ref 26.0–34.0)
MCHC: 34.9 g/dL (ref 30.0–36.0)
MCV: 82.9 fL (ref 78.0–100.0)
PLATELETS: 172 10*3/uL (ref 150–400)
RBC: 5.08 MIL/uL (ref 4.22–5.81)
RDW: 13 % (ref 11.5–15.5)
WBC: 8.7 10*3/uL (ref 4.0–10.5)

## 2016-05-01 LAB — COMPREHENSIVE METABOLIC PANEL
ALT: 19 U/L (ref 17–63)
AST: 18 U/L (ref 15–41)
Albumin: 3.9 g/dL (ref 3.5–5.0)
Alkaline Phosphatase: 68 U/L (ref 38–126)
Anion gap: 10 (ref 5–15)
BILIRUBIN TOTAL: 0.4 mg/dL (ref 0.3–1.2)
BUN: 19 mg/dL (ref 6–20)
CO2: 29 mmol/L (ref 22–32)
CREATININE: 0.98 mg/dL (ref 0.61–1.24)
Calcium: 9.9 mg/dL (ref 8.9–10.3)
Chloride: 98 mmol/L — ABNORMAL LOW (ref 101–111)
Glucose, Bld: 353 mg/dL — ABNORMAL HIGH (ref 65–99)
Potassium: 4.4 mmol/L (ref 3.5–5.1)
Sodium: 137 mmol/L (ref 135–145)
TOTAL PROTEIN: 7.1 g/dL (ref 6.5–8.1)

## 2016-05-01 LAB — RAPID URINE DRUG SCREEN, HOSP PERFORMED
Amphetamines: NOT DETECTED
BARBITURATES: NOT DETECTED
BENZODIAZEPINES: NOT DETECTED
COCAINE: NOT DETECTED
OPIATES: NOT DETECTED
Tetrahydrocannabinol: POSITIVE — AB

## 2016-05-01 LAB — URINALYSIS, ROUTINE W REFLEX MICROSCOPIC
Bilirubin Urine: NEGATIVE
Glucose, UA: >1000 mg/dL — AB
Hgb urine dipstick: NEGATIVE
Ketones, ur: NEGATIVE mg/dL
LEUKOCYTES UA: NEGATIVE
Nitrite: NEGATIVE
PH: 6 (ref 5.0–8.0)
Protein, ur: NEGATIVE mg/dL
SPECIFIC GRAVITY, URINE: 1.031 — AB (ref 1.005–1.030)

## 2016-05-01 LAB — URINE MICROSCOPIC-ADD ON: Bacteria, UA: NONE SEEN

## 2016-05-01 LAB — CBG MONITORING, ED
GLUCOSE-CAPILLARY: 343 mg/dL — AB (ref 65–99)
GLUCOSE-CAPILLARY: 320 mg/dL — AB (ref 65–99)

## 2016-05-01 LAB — ETHANOL

## 2016-05-01 MED ORDER — AMLODIPINE BESYLATE 5 MG PO TABS
5.0000 mg | ORAL_TABLET | Freq: Every day | ORAL | Status: DC
  Administered 2016-05-02 – 2016-05-07 (×6): 5 mg via ORAL
  Filled 2016-05-01 (×6): qty 1

## 2016-05-01 MED ORDER — INSULIN ASPART 100 UNIT/ML ~~LOC~~ SOLN
0.0000 [IU] | Freq: Three times a day (TID) | SUBCUTANEOUS | Status: DC
  Administered 2016-05-02: 11 [IU] via SUBCUTANEOUS
  Administered 2016-05-02: 15 [IU] via SUBCUTANEOUS
  Administered 2016-05-03: 11 [IU] via SUBCUTANEOUS
  Administered 2016-05-03: 5 [IU] via SUBCUTANEOUS
  Administered 2016-05-04: 11 [IU] via SUBCUTANEOUS
  Administered 2016-05-04: 8 [IU] via SUBCUTANEOUS
  Administered 2016-05-05: 3 [IU] via SUBCUTANEOUS
  Administered 2016-05-05: 5 [IU] via SUBCUTANEOUS
  Administered 2016-05-06 (×2): 3 [IU] via SUBCUTANEOUS
  Administered 2016-05-06: 5 [IU] via SUBCUTANEOUS
  Administered 2016-05-07: 3 [IU] via SUBCUTANEOUS
  Filled 2016-05-01 (×13): qty 1

## 2016-05-01 MED ORDER — IBUPROFEN 200 MG PO TABS: 600.0000 mg | ORAL_TABLET | Freq: Three times a day (TID) | ORAL | Status: DC | PRN

## 2016-05-01 MED ORDER — ATORVASTATIN CALCIUM 20 MG PO TABS
20.0000 mg | ORAL_TABLET | Freq: Every day | ORAL | Status: DC
  Administered 2016-05-02 – 2016-05-06 (×5): 20 mg via ORAL
  Filled 2016-05-01 (×7): qty 1

## 2016-05-01 MED ORDER — INSULIN ASPART 100 UNIT/ML ~~LOC~~ SOLN
0.0000 [IU] | Freq: Every day | SUBCUTANEOUS | Status: DC
  Administered 2016-05-01: 4 [IU] via SUBCUTANEOUS
  Administered 2016-05-03: 21:00:00 via SUBCUTANEOUS
  Administered 2016-05-04 – 2016-05-05 (×2): 2 [IU] via SUBCUTANEOUS
  Administered 2016-05-06: 5 [IU] via SUBCUTANEOUS
  Filled 2016-05-01 (×4): qty 1

## 2016-05-01 MED ORDER — METFORMIN HCL 500 MG PO TABS
1000.0000 mg | ORAL_TABLET | Freq: Two times a day (BID) | ORAL | Status: DC
  Administered 2016-05-02 – 2016-05-07 (×10): 1000 mg via ORAL
  Filled 2016-05-01 (×13): qty 2

## 2016-05-01 MED ORDER — GABAPENTIN 300 MG PO CAPS
300.0000 mg | ORAL_CAPSULE | Freq: Two times a day (BID) | ORAL | Status: DC
  Administered 2016-05-01 – 2016-05-07 (×12): 300 mg via ORAL
  Filled 2016-05-01 (×12): qty 1

## 2016-05-01 MED ORDER — RISPERIDONE 2 MG PO TABS
4.0000 mg | ORAL_TABLET | Freq: Two times a day (BID) | ORAL | Status: DC
  Administered 2016-05-02: 4 mg via ORAL
  Administered 2016-05-02: 2 mg via ORAL
  Administered 2016-05-03 – 2016-05-07 (×9): 4 mg via ORAL
  Filled 2016-05-01 (×12): qty 2

## 2016-05-01 MED ORDER — SIMVASTATIN 20 MG PO TABS: 20.0000 mg | ORAL_TABLET | Freq: Every day | ORAL | Status: DC

## 2016-05-01 MED ORDER — TRIHEXYPHENIDYL HCL 2 MG PO TABS
2.0000 mg | ORAL_TABLET | Freq: Every day | ORAL | Status: DC
  Administered 2016-05-02 – 2016-05-07 (×6): 2 mg via ORAL
  Filled 2016-05-01 (×6): qty 1

## 2016-05-01 MED ORDER — GLIPIZIDE 10 MG PO TABS: 10.0000 mg | ORAL_TABLET | Freq: Two times a day (BID) | ORAL | Status: DC

## 2016-05-01 MED ORDER — LORAZEPAM 1 MG PO TABS: 1.0000 mg | ORAL_TABLET | Freq: Three times a day (TID) | ORAL | Status: DC | PRN

## 2016-05-01 MED ORDER — ACETAMINOPHEN 325 MG PO TABS: 650.0000 mg | ORAL_TABLET | ORAL | Status: DC | PRN

## 2016-05-01 MED ORDER — DIVALPROEX SODIUM ER 500 MG PO TB24: 500.0000 mg | ORAL_TABLET | Freq: Every day | ORAL | Status: DC

## 2016-05-01 MED ORDER — ALBUTEROL SULFATE HFA 108 (90 BASE) MCG/ACT IN AERS: 1.0000 | INHALATION_SPRAY | RESPIRATORY_TRACT | Status: DC | PRN

## 2016-05-01 MED ORDER — FERROUS SULFATE 325 (65 FE) MG PO TABS
325.0000 mg | ORAL_TABLET | Freq: Two times a day (BID) | ORAL | Status: DC
  Administered 2016-05-02 – 2016-05-07 (×10): 325 mg via ORAL
  Filled 2016-05-01 (×14): qty 1

## 2016-05-01 MED ORDER — METOPROLOL SUCCINATE ER 50 MG PO TB24: 50.0000 mg | ORAL_TABLET | Freq: Two times a day (BID) | ORAL | Status: DC

## 2016-05-01 MED ORDER — HYDROCHLOROTHIAZIDE 25 MG PO TABS
25.0000 mg | ORAL_TABLET | Freq: Every day | ORAL | Status: DC
  Administered 2016-05-02 – 2016-05-07 (×6): 25 mg via ORAL
  Filled 2016-05-01 (×6): qty 1

## 2016-05-01 MED ORDER — ZOLPIDEM TARTRATE 5 MG PO TABS
5.0000 mg | ORAL_TABLET | Freq: Every evening | ORAL | Status: DC | PRN
  Administered 2016-05-02: 5 mg via ORAL
  Filled 2016-05-01 (×3): qty 1

## 2016-05-01 MED ORDER — ASPIRIN 81 MG PO TABS: 81.0000 mg | ORAL_TABLET | Freq: Every day | ORAL | Status: DC

## 2016-05-01 MED ORDER — LOSARTAN POTASSIUM 50 MG PO TABS: 50.0000 mg | ORAL_TABLET | Freq: Every day | ORAL | Status: DC

## 2016-05-01 NOTE — ED Notes (Signed)
[facility] Expected date:  Expected time:  Means of arrival:  Comments: Tr3

## 2016-05-01 NOTE — ED Notes (Signed)
Pt brought in by [employer], from home, is voluntary.  [facility] and [facility] and visual hallucinations.  Pt denies any SI/HI at this time.  Pt is calm and cooperative.  Pt is diabetic and takes metformin.  States he takes it BID but has not taken his second dose for today.

## 2016-05-01 NOTE — ED Provider Notes (Addendum)
CSN: [id number]     Arrival date & time 05/01/16  1730 History   First MD Initiated Contact with Patient 05/01/16 1923     Chief Complaint  Patient presents with  . Hallucinations  . Anxiety     (Consider location/radiation/quality/duration/timing/severity/associated sxs/prior Treatment) HPI  55 year old male with a history of diabetes and schizophrenia presents with anxiety and auditory hallucinations. He states that he's been living in his new apartment for several months but he has always had a family member stay with him at night. However no one is staying with them now and over the last 2 days he has been hearing noises such as creaks and other sounds in his house. He is very worried and his anxiety is increasing. He called the police because he was so anxious. Police the bedside say he was shaking badly and appeared to be quite anxious. No thoughts of hurting himself or hurting others. States he has been taking his medicines as prescribed. This has happened to him before and he is requesting to be admitted to a psychiatric hospital. Denies feeling ill otherwise  Past Medical History  Diagnosis Date  . Hypertension   . Diabetes mellitus   . High cholesterol   . Schizophrenia (HCC)   . Diabetic neuropathy [facility])    Past Surgical History  Procedure Laterality Date  . Tonsillectomy     Family History  Problem Relation Age of Onset  . Hypertension Mother   . Cancer Father    Social History  Substance Use Topics  . Smoking status: Current Every Day Smoker  . Smokeless tobacco: None  . Alcohol Use: No    Review of Systems  Constitutional: Negative for fever.  Gastrointestinal: Negative for vomiting.  Psychiatric/Behavioral: Positive for hallucinations and sleep disturbance. Negative for suicidal ideas. The patient is nervous/anxious.   All other systems reviewed and are negative.     Allergies  Diphenhydramine; Haldol; and Thorazine  Home Medications   Prior to  Admission medications   Medication Sig Start Date End Date Taking? Authorizing Provider  Albuterol Sulfate (VENTOLIN HFA IN) Inhale into the lungs.      Historical Provider, MD  amLODipine (NORVASC) 5 MG tablet Take 5 mg by mouth daily.      Historical Provider, MD  aspirin 81 MG tablet Take 81 mg by mouth daily.      Historical Provider, MD  divalproex (DEPAKOTE) 500 MG 24 hr tablet Take 500 mg by mouth daily.      Historical Provider, MD  ferrous sulfate 325 (65 FE) MG tablet Take 325 mg by mouth 2 (two) times daily.     Historical Provider, MD  gabapentin (NEURONTIN) 300 MG capsule Take 300 mg by mouth 2 (two) times daily.     Historical Provider, MD  glipiZIDE (GLUCOTROL) 10 MG tablet Take 10 mg by mouth 2 (two) times daily before a meal.      Historical Provider, MD  haloperidol (HALDOL) 5 MG tablet Take 5 mg by mouth 2 (two) times daily.      Historical Provider, MD  haloperidol decanoate (HALDOL DECANOATE) 100 MG/ML injection Inject 100 mg into the muscle every 28 (twenty-eight) days.      Historical Provider, MD  ibuprofen (ADVIL,MOTRIN) 600 MG tablet Take 600 mg by mouth every 6 (six) hours as needed.      Historical Provider, MD  insulin lispro (HUMALOG) 100 UNIT/ML injection Inject 5 Units into the skin 3 (three) times daily before meals.  Historical Provider, MD  losartan (COZAAR) 50 MG tablet Take 50 mg by mouth daily.      Historical Provider, MD  metFORMIN (GLUCOPHAGE) 1000 MG tablet Take 1,000 mg by mouth 2 (two) times daily with a meal.      Historical Provider, MD  metoprolol (TOPROL-XL) 50 MG 24 hr tablet Take 50 mg by mouth 2 (two) times daily.     Historical Provider, MD  simvastatin (ZOCOR) 20 MG tablet Take 20 mg by mouth at bedtime.      Historical Provider, MD  trihexyphenidyl (ARTANE) 5 MG tablet Take 5 mg by mouth 3 (three) times daily with meals.      Historical Provider, MD   BP 156/91 mmHg  Pulse 98  Temp(Src) 98.6 F (37 C) (Oral)  Resp 18  SpO2  98% Physical Exam  Constitutional: He is oriented to person, place, and time. He appears well-developed and well-nourished.  HENT:  Head: Normocephalic and atraumatic.  Right Ear: External ear normal.  Left Ear: External ear normal.  Nose: Nose normal.  Eyes: Right eye exhibits no discharge. Left eye exhibits no discharge.  Neck: Neck supple.  Cardiovascular: Normal rate, regular rhythm, normal heart sounds and intact distal pulses.   Pulmonary/Chest: Effort normal and breath sounds normal.  Abdominal: Soft. There is no tenderness.  Musculoskeletal: He exhibits no edema.  Neurological: He is alert and oriented to person, place, and time. He displays no tremor.  Skin: Skin is warm and dry.  Psychiatric: He has a normal mood and affect. His speech is normal and behavior is normal. He is not actively hallucinating. He expresses no homicidal and no suicidal ideation.  Nursing note and vitals reviewed.   ED Course  Procedures (including critical care time) Labs Review Labs Reviewed  COMPREHENSIVE METABOLIC PANEL - Abnormal; Notable for the following:    Chloride 98 (*)    Glucose, Bld 353 (*)    All other components within normal limits  URINE RAPID DRUG SCREEN, HOSP PERFORMED - Abnormal; Notable for the following:    Tetrahydrocannabinol POSITIVE (*)    All other components within normal limits  URINALYSIS, ROUTINE W REFLEX MICROSCOPIC (NOT AT [facility]) - Abnormal; Notable for the following:    Specific Gravity, Urine 1.031 (*)    Glucose, UA >1000 (*)    All other components within normal limits  URINE MICROSCOPIC-ADD ON - Abnormal; Notable for the following:    Squamous Epithelial / LPF 0-5 (*)    All other components within normal limits  CBG MONITORING, ED - Abnormal; Notable for the following:    Glucose-Capillary 343 (*)    All other components within normal limits  ETHANOL  CBC    Imaging Review No results found. I have personally reviewed and evaluated these images and  lab results as part of my medical decision-making.   EKG Interpretation None      MDM   Final diagnoses:  Schizophrenia, unspecified type (HCC)    Patient is hyperglycemic but no signs of DKA. Will start on home meds and cover with sliding scale insulin. Otherwise medically stable. Psych feels patient meets inpatient criteria and will look for placement    Pricilla LovelessScott Kenly Xiao, MD 05/01/16 2222  Pricilla LovelessScott Corneshia Hines, MD 05/01/16 2229
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
## 2016-05-01 NOTE — ED Notes (Signed)
Pt received calm and visible on unit. Patient endorses  A/ V H (when he is using cocaine) , depression ( mild), anxiety (mild) and pain.  No complaints, stable, in no acute distress. Q15 minute rounds and monitoring via [employer] to continue. Pt hyper focused on his medications, and what he should be taking tonight. Pt was not happy that he needed supplimental insuline. Will continue to assess.

## 2016-05-02 DIAGNOSIS — F200 Paranoid schizophrenia: Secondary | ICD-10-CM | POA: Diagnosis present

## 2016-05-02 DIAGNOSIS — F209 Schizophrenia, unspecified: Secondary | ICD-10-CM | POA: Insufficient documentation

## 2016-05-02 LAB — CBG MONITORING, ED
GLUCOSE-CAPILLARY: 345 mg/dL — AB (ref 65–99)
Glucose-Capillary: 180 mg/dL — ABNORMAL HIGH (ref 65–99)

## 2016-05-02 MED ORDER — BENZTROPINE MESYLATE 1 MG/ML IJ SOLN
1.0000 mg | Freq: Once | INTRAMUSCULAR | Status: AC
  Administered 2016-05-02: 1 mg via INTRAMUSCULAR
  Filled 2016-05-02: qty 2

## 2016-05-02 MED ORDER — PANTOPRAZOLE SODIUM 40 MG PO TBEC
40.0000 mg | DELAYED_RELEASE_TABLET | Freq: Every day | ORAL | Status: DC
  Administered 2016-05-04 – 2016-05-07 (×4): 40 mg via ORAL
  Filled 2016-05-02 (×5): qty 1

## 2016-05-02 MED ORDER — INSULIN GLARGINE 100 UNIT/ML ~~LOC~~ SOLN
15.0000 [IU] | Freq: Every day | SUBCUTANEOUS | Status: DC
  Filled 2016-05-02: qty 0.15

## 2016-05-02 MED ORDER — INSULIN GLARGINE 100 UNIT/ML ~~LOC~~ SOLN
15.0000 [IU] | Freq: Every day | SUBCUTANEOUS | Status: DC
  Administered 2016-05-04 – 2016-05-06 (×3): 15 [IU] via SUBCUTANEOUS
  Filled 2016-05-02 (×5): qty 0.15

## 2016-05-02 MED ORDER — ZIPRASIDONE MESYLATE 20 MG IM SOLR
10.0000 mg | Freq: Once | INTRAMUSCULAR | Status: AC
  Administered 2016-05-02: 10 mg via INTRAMUSCULAR
  Filled 2016-05-02: qty 20

## 2016-05-02 MED ORDER — INDOMETHACIN 25 MG PO CAPS
25.0000 mg | ORAL_CAPSULE | Freq: Two times a day (BID) | ORAL | Status: DC
  Administered 2016-05-02 – 2016-05-07 (×10): 25 mg via ORAL
  Filled 2016-05-02 (×14): qty 1

## 2016-05-02 MED ORDER — AMITRIPTYLINE HCL 25 MG PO TABS
50.0000 mg | ORAL_TABLET | Freq: Every evening | ORAL | Status: DC | PRN
  Administered 2016-05-02 (×2): 50 mg via ORAL
  Filled 2016-05-02 (×2): qty 2

## 2016-05-02 MED ORDER — STERILE WATER FOR INJECTION IJ SOLN
INTRAMUSCULAR | Status: AC
Start: 2016-05-02 — End: 2016-05-02
  Administered 2016-05-02: 1.2 mL
  Filled 2016-05-02: qty 10

## 2016-05-02 MED ORDER — LORAZEPAM 2 MG/ML IJ SOLN
2.0000 mg | Freq: Once | INTRAMUSCULAR | Status: AC
  Administered 2016-05-02: 2 mg via INTRAMUSCULAR
  Filled 2016-05-02: qty 1

## 2016-05-02 NOTE — ED Notes (Signed)
Patient refused vitals.

## 2016-05-02 NOTE — BH Assessment (Signed)
[facility] Assessment Progress Note  Per Thedore MinsMojeed Akintayo, MD, this pt needs to be placed under IVC, which he has initiated.  [place] [place], and at 12:59 H. J. HeinzMagistrate Morton confirmed receipt.  Service of Findings and Custody Order is pending as of this writing.  Doylene Canninghomas Emelda Kohlbeck, MA Triage Specialist [phone]

## 2016-05-02 NOTE — ED Notes (Signed)
CBG 366 

## 2016-05-02 NOTE — ED Notes (Signed)
Pt has been out of his room, talking on the phone and standing in front of the nurses station making racial comments "I'm not talking to you cracker" and "Red neck Devil."

## 2016-05-02 NOTE — ED Notes (Signed)
Insulin give by ChiropodistAssistant Director.

## 2016-05-02 NOTE — ED Notes (Signed)
Pt took po medication from Clinical research associatewriter and displayed less hostility toward writer while taking the medication. No further threats issued.

## 2016-05-02 NOTE — Progress Notes (Signed)
Inpatient Diabetes Program Recommendations  AACE/ADA: New Consensus Statement on Inpatient Glycemic Control (2015)  Target Ranges:  Prepandial:   less than 140 mg/dL      Peak postprandial:   less than 180 mg/dL (1-2 hours)      Critically ill patients:  140 - 180 mg/dL  Results for [name], Dylan (MRN [id number]) as of 05/02/2016 13:10  Ref. Range 05/01/2016 18:51 05/01/2016 22:18 05/02/2016 08:07  Glucose-Capillary Latest Ref Range: 65-99 mg/dL 621343 (H) 308320 (H) 657345 (H)   Review of Glycemic Control  Diabetes history: DM Type 2 Outpatient Diabetes medications: Tradjenta 5 mg q d + Metformin 1 gm bid Current orders for Inpatient glycemic control: Metformin 1 gm bid + Novolog correction scale 0-15 tid + 0-5 q hs  Inpatient Diabetes Program Recommendations:  Spoke with nurse. Please consider Lantus 15 units to assist with glycemic control. Patient uncertain when he will eat or not, so would not recommend meal coverage at this time.  Thank you, Dylan FischerJudy E. Aisha Greenberger, RN, MSN, CDE Inpatient Glycemic Control Team Team Pager [phone] (8am-5pm) 05/02/2016 1:12 PM

## 2016-05-02 NOTE — ED Notes (Addendum)
Pt refused latus, and reports he will refuse all future medication. And that writer should kiss his "black ass" and "suck my dick"

## 2016-05-02 NOTE — ED Notes (Signed)
Pt using racial slurs against people that he deems as not dark enough asking other staff where their" high yellow come from."

## 2016-05-02 NOTE — ED Notes (Signed)
IM medications given by ChiropodistAssistant Director.

## 2016-05-02 NOTE — ED Notes (Addendum)
Pt angry and cussing at Emerson Electricwriter because he requested a peanut butter and jelly sandwich and was told no because of his diabetic status and his desire to keep eating carbs and drinking milk. He is at glass Pharmacist, hospitalthreatening writer and cussing at everyone that tells him "no".

## 2016-05-02 NOTE — BH Assessment (Signed)
[facility] Assessment Progress Note  Per Thedore MinsMojeed Akintayo, MD, this pt requires psychiatric hospitalization at this time.  The following facilities have been contacted to seek placement for this pt, with results as noted:  Beds available, information sent, decision pending:  [name]   [facility]:  [facility]    Katiria Calame, [place] Triage Specialist [phone]

## 2016-05-02 NOTE — Consult Note (Signed)
[facility] Consult   Reason for Consult:  Agitation, aggression, Medication non compliance Referring Physician:  EDP Patient Identification: [name] MRN:  [id number] Principal Diagnosis: Chronic paranoid schizophrenia [facility]) Diagnosis:   Patient Active Problem List   Diagnosis Date Noted  . Chronic paranoid schizophrenia ([place]) [F20.0] 05/02/2016    Priority: High    Total Time spent with patient: 45 minutes  Subjective:   [name] is a 55 y.o. male patient admitted with  Agitation, aggression, Medication non compliance  HPI:  AA male, 55 years old was evaluated this morning for Paranoia.  Patient has a hx of Chronic Schizophrenia, paranoid type.  He also has an ACT team that visits and takes care of his medications.  Patient reports that he came to the ER at the direction of his Nurse who asked him to come to be seen for feeling very Paranoid.  Patient states that he has been feeling lonely and sad since he was moved to [place].  He sees images and hears things when he is lonely.  Patient states that his ACT team staff need to find him a place at his home city where he is familiar with people.  He became agitated pacing the hall way and frequenting the Nursing station wanting to leave.  Patient refused to take his am medications from a Caucasian nurse calling her "white devil"  Patient finally took the medications from an [place] male.  Patient suddenly disrobed and was walking in the hallway.  Security and staff members took him back to his room.  He has been accepted for admission and we will be seeking placement at any facility with available bed.  Past Psychiatric History:  Schizophrenia, Paranoid type  Risk to Self: Suicidal Ideation: No (denies) Suicidal Intent: No Is patient at risk for suicide?: No Suicidal Plan?: No Access to Means: No (denies) What has been your use of drugs/alcohol within the last 12 months?: cigarettes daily How many times?:  2 Other Self Harm Risks: none noted Triggers for Past Attempts: Hallucinations, Other (Comment) (Prison) Intentional Self Injurious Behavior: None Risk to Others: Homicidal Ideation: No (denies) Thoughts of Harm to Others: No Current Homicidal Intent: No Current Homicidal Plan: No Access to Homicidal Means: No Identified Victim: na History of harm to others?: Yes (in prison for attempted rape) Assessment of Violence: In distant past Violent Behavior Description: convicted of attempted rape ("lots" of chgs for assault) Does patient have access to weapons?: No (denies) Criminal Charges Pending?: Yes Describe Pending Criminal Charges: chags for assaulting a "handicapped man" lives next door Does patient have a court date: Yes Court Date:  (May, 2017) Prior Inpatient Therapy: Prior Inpatient Therapy: Yes Prior Therapy Dates: multiple Prior Therapy Facilty/Provider(s): [facility] Reason for Treatment: Schizophrenia Prior Outpatient Therapy: Prior Outpatient Therapy: No Prior Therapy Dates: na Prior Therapy Facilty/Provider(s): na Reason for Treatment: na Does patient have an ACCT team?: Yes ([facility] ACT team) Does patient have Intensive In-House Services?  : No Does patient have [facility] services? : No Does patient have P4CC services?: No  Past Medical History:  Past Medical History  Diagnosis Date  . Hypertension   . Diabetes mellitus   . High cholesterol   . Schizophrenia ([place])   . Diabetic neuropathy [facility])     Past Surgical History  Procedure Laterality Date  . Tonsillectomy     Family History:  Family History  Problem Relation Age of Onset  . Hypertension Mother   . Cancer  Father    Family Psychiatric  History:  Unable to obtain Social History:  History  Alcohol Use No     History  Drug Use No    Social History   Social History  . Marital Status: Single    Spouse Name: N/A  . Number of Children: N/A  . Years of Education: N/A   Social  History Main Topics  . Smoking status: Current Every Day Smoker  . Smokeless tobacco: None  . Alcohol Use: No  . Drug Use: No  . Sexual Activity: Not Asked   Other Topics Concern  . None   Social History Narrative   Additional Social History:    Allergies:   Allergies  Allergen Reactions  . Diphenhydramine     GI upset, paradoxic agitation  . Haldol [Haloperidol]     "makes me tremble, bite my tongue"  . Thorazine [Chlorpromazine]     Tremble, bite my tongue    Labs:  Results for orders placed or performed during the hospital encounter of 05/01/16 (from the past 48 hour(s))  CBG monitoring, ED     Status: Abnormal   Collection Time: 05/01/16  6:51 PM  Result Value Ref Range   Glucose-Capillary 343 (H) 65 - 99 mg/dL   Comment 1 Notify RN   Comprehensive metabolic panel     Status: Abnormal   Collection Time: 05/01/16  7:30 PM  Result Value Ref Range   Sodium 137 135 - 145 mmol/L   Potassium 4.4 3.5 - 5.1 mmol/L   Chloride 98 (L) 101 - 111 mmol/L   CO2 29 22 - 32 mmol/L   Glucose, Bld 353 (H) 65 - 99 mg/dL   BUN 19 6 - 20 mg/dL   Creatinine, Ser 2.39 0.61 - 1.24 mg/dL   Calcium 9.9 8.9 - 35.9 mg/dL   Total Protein 7.1 6.5 - 8.1 g/dL   Albumin 3.9 3.5 - 5.0 g/dL   AST 18 15 - 41 U/L   ALT 19 17 - 63 U/L   Alkaline Phosphatase 68 38 - 126 U/L   Total Bilirubin 0.4 0.3 - 1.2 mg/dL   GFR calc non Af Amer >60 >60 mL/min   GFR calc Af Amer >60 >60 mL/min    Comment: (NOTE) The eGFR has been calculated using the CKD EPI equation. This calculation has not been validated in all clinical situations. eGFR's persistently <60 mL/min signify possible Chronic Kidney Disease.    Anion gap 10 5 - 15  Ethanol     Status: None   Collection Time: 05/01/16  7:30 PM  Result Value Ref Range   Alcohol, Ethyl (B) <5 <5 mg/dL    Comment:        LOWEST DETECTABLE LIMIT FOR SERUM ALCOHOL IS 5 mg/dL FOR MEDICAL PURPOSES ONLY   cbc     Status: None   Collection Time: 05/01/16   7:30 PM  Result Value Ref Range   WBC 8.7 4.0 - 10.5 K/uL   RBC 5.08 4.22 - 5.81 MIL/uL   Hemoglobin 14.7 13.0 - 17.0 g/dL   HCT 40.9 05.0 - 25.6 %   MCV 82.9 78.0 - 100.0 fL   MCH 28.9 26.0 - 34.0 pg   MCHC 34.9 30.0 - 36.0 g/dL   RDW 15.4 88.4 - 57.3 %   Platelets 172 150 - 400 K/uL  Rapid urine drug screen (hospital performed)     Status: Abnormal   Collection Time: 05/01/16  8:08 PM  Result Value Ref  Range   Opiates NONE DETECTED NONE DETECTED   Cocaine NONE DETECTED NONE DETECTED   Benzodiazepines NONE DETECTED NONE DETECTED   Amphetamines NONE DETECTED NONE DETECTED   Tetrahydrocannabinol POSITIVE (A) NONE DETECTED   Barbiturates NONE DETECTED NONE DETECTED    Comment:        DRUG SCREEN FOR MEDICAL PURPOSES ONLY.  IF CONFIRMATION IS NEEDED FOR ANY PURPOSE, NOTIFY LAB WITHIN 5 DAYS.        LOWEST DETECTABLE LIMITS FOR URINE DRUG SCREEN Drug Class       Cutoff (ng/mL) Amphetamine      1000 Barbiturate      200 Benzodiazepine   465 Tricyclics       681 Opiates          300 Cocaine          300 THC              50   Urinalysis, Routine w reflex microscopic     Status: Abnormal   Collection Time: 05/01/16  8:08 PM  Result Value Ref Range   Color, Urine YELLOW YELLOW   APPearance CLEAR CLEAR   Specific Gravity, Urine 1.031 (H) 1.005 - 1.030   pH 6.0 5.0 - 8.0   Glucose, UA >1000 (A) NEGATIVE mg/dL   Hgb urine dipstick NEGATIVE NEGATIVE   Bilirubin Urine NEGATIVE NEGATIVE   Ketones, ur NEGATIVE NEGATIVE mg/dL   Protein, ur NEGATIVE NEGATIVE mg/dL   Nitrite NEGATIVE NEGATIVE   Leukocytes, UA NEGATIVE NEGATIVE  Urine microscopic-add on     Status: Abnormal   Collection Time: 05/01/16  8:08 PM  Result Value Ref Range   Squamous Epithelial / LPF 0-5 (A) NONE SEEN   WBC, UA 0-5 0 - 5 WBC/hpf   RBC / HPF 0-5 0 - 5 RBC/hpf   Bacteria, UA NONE SEEN NONE SEEN  CBG monitoring, ED     Status: Abnormal   Collection Time: 05/01/16 10:18 PM  Result Value Ref Range    Glucose-Capillary 320 (H) 65 - 99 mg/dL  CBG monitoring, ED     Status: Abnormal   Collection Time: 05/02/16  8:07 AM  Result Value Ref Range   Glucose-Capillary 345 (H) 65 - 99 mg/dL    Current Facility-Administered Medications  Medication Dose Route Frequency Provider Last Rate Last Dose  . acetaminophen (TYLENOL) tablet 650 mg  650 mg Oral Q4H PRN Sherwood Gambler, MD      . albuterol (PROVENTIL HFA;VENTOLIN HFA) 108 (90 Base) MCG/ACT inhaler 1 puff  1 puff Inhalation Q4H PRN Sherwood Gambler, MD      . amitriptyline (ELAVIL) tablet 50 mg  50 mg Oral QHS,MR X 1 Spencer E Simon, PA-C   50 mg at 05/02/16 0147  . amLODipine (NORVASC) tablet 5 mg  5 mg Oral Daily Sherwood Gambler, MD   5 mg at 05/02/16 0942  . atorvastatin (LIPITOR) tablet 20 mg  20 mg Oral q1800 Sherwood Gambler, MD      . ferrous sulfate tablet 325 mg  325 mg Oral BID WC Sherwood Gambler, MD   325 mg at 05/02/16 2751  . gabapentin (NEURONTIN) capsule 300 mg  300 mg Oral BID Sherwood Gambler, MD   300 mg at 05/02/16 0942  . hydrochlorothiazide (HYDRODIURIL) tablet 25 mg  25 mg Oral Daily Sherwood Gambler, MD   25 mg at 05/02/16 0942  . ibuprofen (ADVIL,MOTRIN) tablet 600 mg  600 mg Oral Q8H PRN Sherwood Gambler, MD      .  indomethacin (INDOCIN) capsule 25 mg  25 mg Oral BID WC Merryl Hacker, MD   25 mg at 05/02/16 5852  . insulin aspart (novoLOG) injection 0-15 Units  0-15 Units Subcutaneous TID WC Sherwood Gambler, MD   11 Units at 05/02/16 [phone]  . insulin aspart (novoLOG) injection 0-5 Units  0-5 Units Subcutaneous QHS Sherwood Gambler, MD   4 Units at 05/01/16 2237  . LORazepam (ATIVAN) tablet 1 mg  1 mg Oral Q8H PRN Sherwood Gambler, MD      . metFORMIN (GLUCOPHAGE) tablet 1,000 mg  1,000 mg Oral BID WC Sherwood Gambler, MD   1,000 mg at 05/02/16 0823  . pantoprazole (PROTONIX) EC tablet 40 mg  40 mg Oral Daily Alen Matheson, MD      . risperiDONE (RISPERDAL) tablet 4 mg  4 mg Oral BID Sherwood Gambler, MD   2 mg at 05/02/16 0943  . sterile  water (preservative free) injection           . trihexyphenidyl (ARTANE) tablet 2 mg  2 mg Oral Daily Sherwood Gambler, MD   2 mg at 05/02/16 0943  . zolpidem (AMBIEN) tablet 5 mg  5 mg Oral QHS PRN Sherwood Gambler, MD       Current Outpatient Prescriptions  Medication Sig Dispense Refill  . amLODipine (NORVASC) 5 MG tablet Take 5 mg by mouth daily.      Marland Kitchen gabapentin (NEURONTIN) 300 MG capsule Take 300 mg by mouth 3 (three) times daily.     . hydrochlorothiazide (HYDRODIURIL) 25 MG tablet Take 25 mg by mouth daily.    . indomethacin (INDOCIN) 25 MG capsule Take 25 mg by mouth 2 (two) times daily with a meal.    . linagliptin (TRADJENTA) 5 MG TABS tablet Take 5 mg by mouth daily.    . metFORMIN (GLUCOPHAGE) 1000 MG tablet Take 1,000 mg by mouth 2 (two) times daily with a meal.      . pantoprazole (PROTONIX) 40 MG tablet Take 40 mg by mouth daily.    . risperidone (RISPERDAL) 4 MG tablet Take 4 mg by mouth 2 (two) times daily.    . trihexyphenidyl (ARTANE) 2 MG tablet Take 2 mg by mouth daily.    Marland Kitchen atorvastatin (LIPITOR) 20 MG tablet Take 20 mg by mouth daily. Reported on 05/01/2016      Musculoskeletal: Strength & Muscle Tone: within normal limits Gait & Station: normal Patient leans: N/A  Psychiatric Specialty Exam: Review of Systems  Unable to perform ROS: mental acuity    Blood pressure 156/91, pulse 98, temperature 98.6 F (37 C), temperature source Oral, resp. rate 18, SpO2 98 %.There is no weight on file to calculate BMI.  General Appearance: Casual and Disheveled  Eye Contact::  Good  Speech:  Pressured  Volume:  Increased  Mood:  Angry and Irritable  Affect:  Congruent, Labile and Full Range  Thought Process:  Disorganized and Loose  Orientation:  Full (Time, Place, and Person)  Thought Content:  Hallucinations: Auditory Visual and Paranoid Ideation  Suicidal Thoughts:  No  Homicidal Thoughts:  No  Memory:  Immediate;   Poor Recent;   Poor Remote;   Poor  Judgement:   Poor  Insight:  Shallow  Psychomotor Activity:  Increased and Restlessness  Concentration:  Poor  Recall:  NA  Fund of Knowledge:Poor  Language: Good  Akathisia:  No  Handed:  Right  AIMS (if indicated):     Assets:  Desire for Improvement Housing  ADL's:  Impaired  Cognition: WNL  Sleep:      Treatment Plan Summary: Daily contact with patient to assess and evaluate symptoms and progress in treatment and Medication management  Disposition:   Accepted for admission and we will be seeking placement at any facility with available bed.  We have given patient our emergency medication for severe agitation; Geodon 10 mg im, Cogentin 2 mg im.  We have placed patient on IVC as he has been threatening to leave without receiving care.  Delfin Gant, NP   PMHNP-BC 05/02/2016 12:04 PM Patient seen face-to-face for psychiatric evaluation, chart reviewed and case discussed with the physician extender and developed treatment plan. Reviewed the information documented and agree with the treatment plan. Corena Pilgrim, MD

## 2016-05-02 NOTE — ED Notes (Addendum)
Pt repeatedly pressing the emergency med call "until I get a sandwich." Ham sandwich provided which pt had refused earlier because all we had is ham/

## 2016-05-02 NOTE — ED Provider Notes (Signed)
3:45 AM Was asked to evaluate the patient by nursing as patient aggressive to nursing staff. Patient reports multiple complaints including foot pain and not "getting my home medication." He also reports that he is hungry and wants a sandwich. Nursing is concerned given his hyperglycemia. Patient is voluntary at this time but does meet inpatient criteria. He does not appear to be responding to internal stimuli but does speak in a threatening tone and called the staff "the devil." Patient's home indomethacin was ordered for his foot pain. Instructed nursing to provide patient with a sandwich. We'll have psychiatry reevaluate in the morning. At this time no indication for IVC the patient would likely benefit from psychiatric admission for mood adjustment.  Shon Batonourtney F Horton, MD 05/02/16 [phone]

## 2016-05-02 NOTE — ED Notes (Signed)
Pt offered cheese sticks to which he responded "I don't want that fucking shit"

## 2016-05-02 NOTE — ED Notes (Signed)
Pt is irritable demanding pacing and putting his middle finger up to police and staff. Pt called staff white devils and refused to take 1000 medication from writer. Pt took 0800 medication reluctantly and took insulin needed out of staff's hand and gave it himself. When writer asked about suicidal thoughts, pt replied "Get the hell out." Pt verbally denied si and hi when MD was present. Pt took 1000 medications from Chiropodistassistant director.

## 2016-05-02 NOTE — ED Notes (Signed)
Pt spitting on the ground in department, continues to curse at staff and security.  Dr Wilkie AyeHorton notified.

## 2016-05-02 NOTE — ED Notes (Signed)
Pt's tray was spread out in the hall. Writer asked pt if there was a reason why his tray was in the hall and he replied "Shut the F--up." Tray was removed from the hall.

## 2016-05-02 NOTE — ED Notes (Addendum)
Pt complaining of the medications offered to him by Clinical research associatewriter. Demanding to talk to security, Field seismologistcalling writer "devil bitch" when medication offered for pain. Pt refuses to interact with Clinical research associatewriter and threatened violence if Clinical research associatewriter came into his room again, refusing care from Clinical research associatewriter.  Pt reports "I don;t want no fucking tylenol or advil"

## 2016-05-02 NOTE — ED Notes (Signed)
Pt request to speak to a MD, EDP notified.

## 2016-05-03 LAB — CBG MONITORING, ED
GLUCOSE-CAPILLARY: 322 mg/dL — AB (ref 65–99)
GLUCOSE-CAPILLARY: 222 mg/dL — AB (ref 65–99)
Glucose-Capillary: 310 mg/dL — ABNORMAL HIGH (ref 65–99)
Glucose-Capillary: 306 mg/dL — ABNORMAL HIGH (ref 65–99)

## 2016-05-03 NOTE — ED Notes (Addendum)
Pt let MHT carry his meds in and he took them with nurse observing.  Pt is verbally aggressive and easily agitated in the presence of caucasian staff.

## 2016-05-03 NOTE — ED Notes (Signed)
Patient appears cooperative. Denies SI, HI, AVH. States that he is feeling "clearer" since starting back on Artane. C/o neuropathy.   Encouragement offered. Snack provided.  Q 15 safety checks in place.

## 2016-05-03 NOTE — ED Notes (Signed)
Pt informed me immediately that he would not interact with a "white nurse"  He has a flat affect and appears very agitated. 15 minute checks and video monitoring continue.

## 2016-05-03 NOTE — BHH Counselor (Signed)
[facility] Assessment Progress Note  Pt re-assessed this AM. Pt continues to display labile, psychotic bx.. Pt refused his medication and used abusive language to the night RN. Pt agreed to take his medication this morning and was pleasant to Clinical research associatewriter. Pt stated he wanted to go home to get his TV fixed and then began to ask about dentistry services while he was here-he wanted to get two teeth pulled. Per Dr. Jannifer FranklinAkintayo & Dahlia ByesJosephine Onuoha, PMH-NP, pt continues to meet INPT criteria.   Johny ShockSamantha M. Ladona Ridgelaylor, MS, NCC, LPCA Counselor

## 2016-05-03 NOTE — BH Assessment (Signed)
[facility] Assessment Progress Note  Per Thedore MinsMojeed Akintayo, MD, this pt continues to require psychiatric hospitalization.  The following facilities have been contacted to seek placement for this pt, with results as noted:  Beds available, information sent, decision pending:  [facility]   At capacity:  [facility]   Doylene Canninghomas Jaedin Trumbo, [place] Triage Specialist [phone]

## 2016-05-03 NOTE — Progress Notes (Signed)
Inpatient Diabetes Program Recommendations  AACE/ADA: New Consensus Statement on Inpatient Glycemic Control (2015)  Target Ranges:  Prepandial:   less than 140 mg/dL      Peak postprandial:   less than 180 mg/dL (1-2 hours)      Critically ill patients:  140 - 180 mg/dL  Results for [name], [name] (MRN [id number]) as of 05/03/2016 11:22  Ref. Range 05/01/2016 18:51 05/01/2016 22:18 05/02/2016 08:07 05/02/2016 20:17 05/03/2016 08:13  Glucose-Capillary Latest Ref Range: 65-99 mg/dL 401343 (H) 027320 (H) 253345 (H) 180 (H) 322 (H)     Inpatient Diabetes Program Recommendations:  Noted CBGs elevated and patient refused Lantus insulin last hs.  Thank you, Billy FischerJudy E. Estephan Gallardo, RN, MSN, CDE Inpatient Glycemic Control Team Team Pager [phone] (8am-5pm) 05/03/2016 11:23 AM

## 2016-05-04 LAB — CBG MONITORING, ED
GLUCOSE-CAPILLARY: 302 mg/dL — AB (ref 65–99)
GLUCOSE-CAPILLARY: 248 mg/dL — AB (ref 65–99)
Glucose-Capillary: 294 mg/dL — ABNORMAL HIGH (ref 65–99)
Glucose-Capillary: 165 mg/dL — ABNORMAL HIGH (ref 65–99)

## 2016-05-04 NOTE — ED Notes (Signed)
Report received from RN. Patient alert and oriented, warm and dry, in no acute distress. Patient denies SI, HI, AVH and pain. Patient made aware of Q15 minute rounds and security cameras for their safety. Patient instructed to come to me with needs or concerns.

## 2016-05-04 NOTE — ED Notes (Signed)
Patient noted in hall. No complaints, stable, in no acute distress. Q15 minute rounds and monitoring via Security Cameras to continue. 

## 2016-05-04 NOTE — ED Notes (Addendum)
Pt is irritable and racist, refusing to speak to me or "any white nurse"  15 minute checks and video monitoring continue.

## 2016-05-04 NOTE — ED Notes (Signed)
Patient noted sleeping in room. No complaints, stable, in no acute distress. Q15 minute rounds and monitoring via Security Cameras to continue.  

## 2016-05-04 NOTE — BH Assessment (Signed)
[facility] Assessment Progress Note  Per Thedore MinsMojeed Akintayo, MD, this pt continues to require psychiatric hospitalization. The following facilities have been contacted to seek placement for this pt, with results as noted:  Beds available, information sent, decision pending:  [facility]   [facility]:  [facility]   Doylene Canninghomas Gurney B[place]or, [place] Triage Specialist [phone]

## 2016-05-04 NOTE — Progress Notes (Signed)
At the request of the Psychiatrist, CSW called patient's ACT Team ([facility]) to discuss patient's baseline. CSW spoke with MaliJasmine who provided CSW the name and contact number for a member of the ACT Team, Clemetine MarkerChris Bynum.  CSW spoke with Clemetine Markerhris Bynum, ACT Team member with [facility]. He stated patient has been calling their agency to ask the Psychiatrist to come and discharge him from the hospital. He stated patient can become aggressive, however, he states patient is not like that when he is calm and doing well. He stated patient will become belligerent and that nothing pleases patient. He stated patient is aware of his check and he more than likely wants to get out to obtain his check.    [facility] [phone] Clemetine Markerhris Bynum, ACT Team Member [phone]  Elenore PaddyLaVonia Latana Colin, LCSWA [phone] ED CSW 05/04/2016 10:13 AM

## 2016-05-04 NOTE — ED Notes (Signed)
Patient noted in room. No complaints, stable, in no acute distress. Q15 minute rounds and monitoring via Security Cameras to continue.  

## 2016-05-04 NOTE — BH Assessment (Signed)
B[facility] Assessment Progress Note Patient was seen this date by this writer with patient presenting with a agitated affect. Patient stated he was not doing well because he has been living alone and "hearing things in his apartment." Patient does admit to ongoing AH but states he cannot make out what the voices are saying. Patient denies any command hallucinations. Patient also denies any S/I or H/I. Per Dr. Jannifer FranklinAkintayo & Dahlia ByesJosephine Onuoha, PMH-NP, pt continues to meet INPT criteria.

## 2016-05-05 LAB — CBG MONITORING, ED
Glucose-Capillary: 174 mg/dL — ABNORMAL HIGH (ref 65–99)
Glucose-Capillary: 219 mg/dL — ABNORMAL HIGH (ref 65–99)
Glucose-Capillary: 229 mg/dL — ABNORMAL HIGH (ref 65–99)

## 2016-05-05 NOTE — ED Notes (Signed)
Up to the bathroom 

## 2016-05-05 NOTE — ED Notes (Signed)
Sitting in dayroom watching tv 

## 2016-05-05 NOTE — ED Notes (Signed)
Patient noted sleeping in room. No complaints, stable, in no acute distress. Q15 minute rounds and monitoring via Security Cameras to continue.  

## 2016-05-05 NOTE — BH Assessment (Signed)
[facility] Assessment Progress Note Attempt made to contact patient's ACT team unsuccessfully. [facility] [phone]. Patient per Jonnalagadda MD, recommended patient be re-evaluated in the a.m. to determine if IVC could be rescinded based on gathering collateral from patient's ACT team and how patient presents.
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
## 2016-05-05 NOTE — ED Notes (Signed)
NAD, resting quietly.  Pt reports that he did not feel like talking at this time.

## 2016-05-05 NOTE — ED Notes (Signed)
In day room watching tv.  [name] into see

## 2016-05-05 NOTE — ED Notes (Signed)
On the phone 

## 2016-05-05 NOTE — BH Assessment (Signed)
[facility] Assessment Progress Note This Clinical research associatewriter spoke with patient along with Jonnalagadda MD with patient stating he was "ready to go home." Patient denies any AVH, S/I or H/I but still has concerns in reference to going home and "being alone" since his television is not working. Patient states his AH increases when he is by himself and doesn't have a TV or any "friends to talk to." This writer attempted to contact his ACT team "[facility]" [phone]) and left a message to return this writer's call.

## 2016-05-05 NOTE — ED Notes (Signed)
Report received from Janie Rambo RN. Patient alert and oriented, warm and dry, in no acute distress. Patient denies SI, HI, AVH and pain. Patient made aware of Q15 minute rounds and security cameras for their safety. Patient instructed to come to me with needs or concerns.  

## 2016-05-05 NOTE — ED Notes (Signed)
Dr Shela CommonsJ and Vernona RiegerLaura NP into see

## 2016-05-05 NOTE — ED Notes (Signed)
Patient noted in room. No complaints, stable, in no acute distress. Q15 minute rounds and monitoring via Security Cameras to continue.  

## 2016-05-05 NOTE — ED Notes (Signed)
Patient noted in hall. No complaints, stable, in no acute distress. Q15 minute rounds and monitoring via Security Cameras to continue. 

## 2016-05-06 LAB — CBG MONITORING, ED
GLUCOSE-CAPILLARY: 351 mg/dL — AB (ref 65–99)
Glucose-Capillary: 193 mg/dL — ABNORMAL HIGH (ref 65–99)
Glucose-Capillary: 239 mg/dL — ABNORMAL HIGH (ref 65–99)
Glucose-Capillary: 192 mg/dL — ABNORMAL HIGH (ref 65–99)

## 2016-05-06 NOTE — ED Notes (Signed)
Patient noted sleeping in room. No complaints, stable, in no acute distress. Q15 minute rounds and monitoring via Security Cameras to continue.  

## 2016-05-06 NOTE — ED Notes (Signed)
Patient noted in day room. No complaints, stable, in no acute distress. Q15 minute rounds and monitoring via Security Cameras to continue. 

## 2016-05-06 NOTE — ED Notes (Signed)
Report received from Christine RN. Patient alert and oriented, warm and dry, in no acute distress. Patient denies SI, HI, AVH and pain. Patient made aware of Q15 minute rounds and security cameras for their safety. Patient instructed to come to me with needs or concerns. 

## 2016-05-06 NOTE — ED Notes (Signed)
Pt has been in the TV room a good part of the shift. Asks frequently for coffee. Sits quietly. Wants you to go over each medication and to see what each color of each pill is. Is aprenhensive in taking the medications, but does take them. Asks for snacks frequently as well. Has good technique with administering himself his insulin. Pt is vague in answering questions due to his paranoid thinking. Provided with a 1:1. Given support and reassurance when appropriate.

## 2016-05-07 LAB — CBG MONITORING, ED: GLUCOSE-CAPILLARY: 191 mg/dL — AB (ref 65–99)

## 2016-05-07 NOTE — ED Notes (Signed)
Pt's mood has improved since writer last interacted with pt. Pt took medication from Clinical research associatewriter. He denies si and hi. Pt denies hallucinations. Safety maintained in the SAP[facility].

## 2016-05-07 NOTE — ED Notes (Signed)
Patient noted sleeping in room. No complaints, stable, in no acute distress. Q15 minute rounds and monitoring via Security Cameras to continue.  

## 2016-05-07 NOTE — BH Assessment (Addendum)
[facility] Assessment Progress Note  Per Thedore MinsMojeed Akintayo, MD, this pt does not require psychiatric hospitalization at this time.  He presents under IVC initiated by Dr Jannifer FranklinAkintayo on 05/02/2016.  [facility] to follow up with his ACT Team.  IVC has been rescinded.  Discharge instructions advise pt to follow up with [facility], pt's ACT Team provider.  Pt's nurse has been notified.  Doylene Canninghomas Soul Deveney, MA Triage Specialist [phone]

## 2016-05-07 NOTE — ED Notes (Signed)
Pt d/c with SW. All items returned. D/C instructions given. Pt denies si and hi.

## 2016-05-07 NOTE — [facility] Suicide Risk Assessment (Signed)
Suicide Risk Assessment  Discharge Assessment   [facility]   Principal Problem: Chronic paranoid schizophrenia [facility](HCC) Discharge Diagnoses:  Patient Active Problem List   Diagnosis Date Noted  . Chronic paranoid schizophrenia (HCC) [F20.0] 05/02/2016    Total Time spent with patient: 30 minutes   Musculoskeletal: Strength & Muscle Tone: within normal limits Gait & Station: normal Patient leans: N/A  Psychiatric Specialty Exam:   Blood pressure 126/78, pulse 74, temperature 98.5 F (36.9 C), temperature source Oral, resp. rate 16, SpO2 97 %.There is no weight on file to calculate BMI.  General Appearance: Casual  Eye Contact::  Good  Speech:  Normal Rate409  Volume:  Normal  Mood:  Anxious, mild  Affect:  Congruent  Thought Process:  Coherent  Orientation:  Full (Time, Place, and Person)  Thought Content:  WDL  Suicidal Thoughts:  No  Homicidal Thoughts:  No  Memory:  Immediate;   Good Recent;   Good Remote;   Good  Judgement:  Good  Insight:  Good  Psychomotor Activity:  Normal  Concentration:  Good  Recall:  Good  Fund of Knowledge:Good  Language: Good  Akathisia:  No  Handed:  Right  AIMS (if indicated):     Assets:  Housing Leisure Time Physical Health Resilience Social Support  Sleep:     Cognition: WNL  ADL's:  Intact   Mental Status Per Nursing Assessment::   On Admission:   Anxious, hallucinations  Demographic Factors:  Male  Loss Factors: NA  Historical Factors: NA  Risk Reduction Factors:   Sense of responsibility to family, Positive social support and Positive therapeutic relationship  Continued Clinical Symptoms:  Anxiety, mild  Cognitive Features That Contribute To Risk:  None    Suicide Risk:  Minimal: No identifiable suicidal ideation.  Patients presenting with no risk factors but with morbid ruminations; may be classified as minimal risk based on the severity of the depressive symptoms    Plan  Of Care/Follow-up recommendations:  Activity:  as tolerated Diet:  heart healthy diet  LORD, JAMISON, NP 05/07/2016, 10:17 AM

## 2016-05-07 NOTE — Discharge Instructions (Signed)
For your ongoing behavioral health needs, you are advised to follow up with [facility], your ACT Team provider: ° °     [facility] °     [address] °     [place], [place] [address] °     [phone] °

## 2016-05-07 NOTE — BH Assessment (Signed)
[facility] Assessment Progress Note  This Clinical research associatewriter spoke with patient this date with patient presenting with appropriate affect. Patient denies any S/I, H/I or AVH and states he is "ready to be discharged". Case was staffed with Akintayo MD who agreed to patient being discharged this date to his residence.Patient is currently being seen by [facility] as this Clinical research associatewriter gathered collateral from the assistant team lead [name]([name] [phone]) this date who will assist with transportion when patient is discharged. This Clinical research associatewriter also contacted patient's case worker Dylan MarkerChris Boyd [phone] and left message to return this writer's call in reference to transporting patient from [facility] back to his residence.

## 2016-05-12 ENCOUNTER — Emergency Department (HOSPITAL_COMMUNITY)
Admission: EM | Admit: 2016-05-12 | Discharge: 2016-05-12 | Disposition: A | Discharge status: Home / Self Care | Payer: Medicaid Other | Attending: Emergency Medicine | Admitting: Emergency Medicine

## 2016-05-12 ENCOUNTER — Encounter (HOSPITAL_COMMUNITY): Payer: Self-pay | Admitting: Emergency Medicine

## 2016-05-12 DIAGNOSIS — F209 Schizophrenia, unspecified: Secondary | ICD-10-CM | POA: Diagnosis not present

## 2016-05-12 DIAGNOSIS — R05 Cough: Secondary | ICD-10-CM | POA: Diagnosis present

## 2016-05-12 DIAGNOSIS — F17200 Nicotine dependence, unspecified, uncomplicated: Secondary | ICD-10-CM | POA: Insufficient documentation

## 2016-05-12 DIAGNOSIS — E1140 Type 2 diabetes mellitus with diabetic neuropathy, unspecified: Secondary | ICD-10-CM | POA: Insufficient documentation

## 2016-05-12 DIAGNOSIS — E7800 Pure hypercholesterolemia, unspecified: Secondary | ICD-10-CM | POA: Diagnosis not present

## 2016-05-12 DIAGNOSIS — I10 Essential (primary) hypertension: Secondary | ICD-10-CM | POA: Diagnosis not present

## 2016-05-12 DIAGNOSIS — Z7984 Long term (current) use of oral hypoglycemic drugs: Secondary | ICD-10-CM | POA: Diagnosis not present

## 2016-05-12 DIAGNOSIS — R531 Weakness: Secondary | ICD-10-CM | POA: Insufficient documentation

## 2016-05-12 DIAGNOSIS — Z5321 Procedure and treatment not carried out due to patient leaving prior to being seen by health care provider: Secondary | ICD-10-CM | POA: Diagnosis not present

## 2016-05-12 NOTE — ED Notes (Signed)
Pt called from waiting room still no answer.

## 2016-05-12 NOTE — ED Notes (Signed)
Pt has been called 3 times since his triaged was completed, no answer.

## 2016-05-12 NOTE — ED Notes (Addendum)
Per EMS. Pt complains of generalized weakness and a productive cough. Pt ambulatory. Pt reports this has been going on for 2 days. Also requesting an albuterol inhaler as he ran out.

## 2016-05-13 ENCOUNTER — Emergency Department (HOSPITAL_COMMUNITY)
Admission: EM | Admit: 2016-05-13 | Discharge: 2016-05-13 | Discharge status: Against Medical Advice | Payer: Medicaid Other | Attending: Emergency Medicine | Admitting: Emergency Medicine

## 2016-05-13 ENCOUNTER — Emergency Department (HOSPITAL_COMMUNITY): Payer: Medicaid Other

## 2016-05-13 DIAGNOSIS — R062 Wheezing: Secondary | ICD-10-CM

## 2016-05-13 DIAGNOSIS — E7800 Pure hypercholesterolemia, unspecified: Secondary | ICD-10-CM | POA: Diagnosis not present

## 2016-05-13 DIAGNOSIS — F17200 Nicotine dependence, unspecified, uncomplicated: Secondary | ICD-10-CM | POA: Diagnosis not present

## 2016-05-13 DIAGNOSIS — E1140 Type 2 diabetes mellitus with diabetic neuropathy, unspecified: Secondary | ICD-10-CM | POA: Diagnosis not present

## 2016-05-13 DIAGNOSIS — R05 Cough: Secondary | ICD-10-CM

## 2016-05-13 DIAGNOSIS — J40 Bronchitis, not specified as acute or chronic: Secondary | ICD-10-CM | POA: Diagnosis not present

## 2016-05-13 DIAGNOSIS — I10 Essential (primary) hypertension: Secondary | ICD-10-CM | POA: Insufficient documentation

## 2016-05-13 DIAGNOSIS — E1165 Type 2 diabetes mellitus with hyperglycemia: Secondary | ICD-10-CM | POA: Insufficient documentation

## 2016-05-13 DIAGNOSIS — J069 Acute upper respiratory infection, unspecified: Secondary | ICD-10-CM

## 2016-05-13 DIAGNOSIS — R059 Cough, unspecified: Secondary | ICD-10-CM

## 2016-05-13 DIAGNOSIS — F209 Schizophrenia, unspecified: Secondary | ICD-10-CM | POA: Insufficient documentation

## 2016-05-13 LAB — CBC
HCT: 38.8 % — ABNORMAL LOW (ref 39.0–52.0)
Hemoglobin: 13.8 g/dL (ref 13.0–17.0)
MCH: 29 pg (ref 26.0–34.0)
MCHC: 35.6 g/dL (ref 30.0–36.0)
MCV: 81.5 fL (ref 78.0–100.0)
Platelets: 173 10*3/uL (ref 150–400)
RBC: 4.76 MIL/uL (ref 4.22–5.81)
RDW: 12.9 % (ref 11.5–15.5)
WBC: 9.7 10*3/uL (ref 4.0–10.5)

## 2016-05-13 LAB — BASIC METABOLIC PANEL
Anion gap: 9 (ref 5–15)
BUN: 15 mg/dL (ref 6–20)
CALCIUM: 9.1 mg/dL (ref 8.9–10.3)
CO2: 24 mmol/L (ref 22–32)
CREATININE: 0.91 mg/dL (ref 0.61–1.24)
Chloride: 99 mmol/L — ABNORMAL LOW (ref 101–111)
GFR calc Af Amer: >60 mL/min (ref >60)
GLUCOSE: 304 mg/dL — AB (ref 65–99)
Potassium: 3.9 mmol/L (ref 3.5–5.1)
Sodium: 132 mmol/L — ABNORMAL LOW (ref 135–145)

## 2016-05-13 LAB — CBG MONITORING, ED: GLUCOSE-CAPILLARY: 299 mg/dL — AB (ref 65–99)

## 2016-05-13 MED ORDER — ALBUTEROL SULFATE HFA 108 (90 BASE) MCG/ACT IN AERS: 2.0000 | INHALATION_SPRAY | RESPIRATORY_TRACT | Status: DC | PRN

## 2016-05-13 MED ORDER — ALBUTEROL SULFATE (2.5 MG/3ML) 0.083% IN NEBU
5.0000 mg | INHALATION_SOLUTION | Freq: Once | RESPIRATORY_TRACT | Status: AC
  Administered 2016-05-13: 5 mg via RESPIRATORY_TRACT
  Filled 2016-05-13: qty 6

## 2016-05-13 MED ORDER — IPRATROPIUM BROMIDE 0.02 % IN SOLN
0.5000 mg | Freq: Once | RESPIRATORY_TRACT | Status: AC
  Administered 2016-05-13: 0.5 mg via RESPIRATORY_TRACT
  Filled 2016-05-13: qty 2.5

## 2016-05-13 NOTE — ED Notes (Signed)
Pt walked out of room and stated that  he is leaving , not waiting for discharge . PA.  made aware

## 2016-05-13 NOTE — ED Provider Notes (Signed)
CSN: [id number]     Arrival date & time 05/13/16  1427 History   First MD Initiated Contact with Patient 05/13/16 1511     Chief Complaint  Patient presents with  . Hyperglycemia  . Cough  . Nasal Congestion     (Consider location/radiation/quality/duration/timing/severity/associated sxs/prior Treatment) HPI Comments: [name] is a 55 y.o. male with a PMHx of DM2 and HTN, who presents to the ED with complaints of high blood sugars and URI symptoms 2-3 days. Patient states he noticed his blood sugars were in the upper 200s over the last 2 days, he takes metformin 1000 mg twice a day and another unknown medication for diabetes. He doesn't have his medications with him, and cannot recall the names of them. He states that over the last 2-3 days he has had a cough with green-yellow sputum production, wheezing, and nasal congestion. He states he has a primary care doctor that he cannot recall the name of, but she is at [facility], but that he has not been able to go in for a visit to get refills on his inhaler. He has not tried anything for symptoms, no known aggravating factors. He is requesting a refill of his inhaler today. He [facility] where he "checked himself in and got off the streets", stating that that is where they started him on all his medications for diabetes, but there is no record of this in the computer. He reports that he is now back at home. He does admit to being a smoker. Denies any sick contacts or recent travel.  He denies any fevers, chills, rhinorrhea, ear pain or drainage, eye symptoms or vision changes, chest pain, shortness breath, sore throat, abdominal pain, nausea vomiting, diarrhea, constipation, dysuria, hematuria, numbness, tingling, or focal weakness. He has no other complaints at this time.  Patient is a 55 y.o. male presenting with hyperglycemia and cough. The history is provided by the patient. No language  interpreter was used.  Hyperglycemia Associated symptoms: no abdominal pain, no chest pain, no confusion, no dysuria, no fever, no nausea, no shortness of breath, no vomiting and no weakness   Cough Cough characteristics:  Productive Sputum characteristics:  Green and yellow Severity:  Moderate Onset quality:  Gradual Duration:  3 days Timing:  Constant Progression:  Unchanged Chronicity:  New Smoker: yes   Context: upper respiratory infection   Context: not sick contacts   Relieved by:  None tried Worsened by:  Nothing tried Ineffective treatments:  None tried Associated symptoms: sinus congestion and wheezing   Associated symptoms: no chest pain, no chills, no ear pain, no fever, no myalgias, no rhinorrhea, no shortness of breath and no sore throat   Risk factors: no recent travel     No past medical history on file. No past surgical history on file. No family history on file. Social History  Substance Use Topics  . Smoking status: Not on file  . Smokeless tobacco: Not on file  . Alcohol Use: Not on file    Review of Systems  Constitutional: Negative for fever and chills.  HENT: Positive for sinus pressure. Negative for ear discharge, ear pain, rhinorrhea and sore throat.   Eyes: Negative for visual disturbance.  Respiratory: Positive for cough and wheezing. Negative for shortness of breath.   Cardiovascular: Negative for chest pain.  Gastrointestinal: Negative for nausea, vomiting, abdominal pain, diarrhea and constipation.  Genitourinary: Negative for dysuria and hematuria.  Musculoskeletal: Negative for myalgias and  arthralgias.  Skin: Negative for color change.  Allergic/Immunologic: Positive for immunocompromised state (diabetic).  Neurological: Negative for weakness and numbness.  Psychiatric/Behavioral: Negative for confusion.   10 Systems reviewed and are negative for acute change except as noted in the HPI.    Allergies  Benadryl; Haldol; and  Thorazine  Home Medications   Prior to Admission medications   Not on File   BP 144/90 mmHg  Pulse 87  Temp(Src) 98.5 F (36.9 C) (Oral)  Resp 18  Ht 6\' 1"  (1.854 m)  Wt 95.255 kg  BMI 27.71 kg/m2  SpO2 95% Physical Exam  Constitutional: He is oriented to person, place, and time. Vital signs are normal. He appears well-developed and well-nourished.  Non-toxic appearance. No distress.  Afebrile, nontoxic, NAD  HENT:  Head: Normocephalic and atraumatic.  Nose: Mucosal edema present.  Mouth/Throat: Uvula is midline, oropharynx is clear and moist and mucous membranes are normal. No trismus in the jaw. No uvula swelling.  Nose with mild mucosal edema. Oropharynx clear and moist, without uvular swelling or deviation, no trismus or drooling, no tonsillar swelling or erythema, no exudates.    Eyes: Conjunctivae and EOM are normal. Right eye exhibits no discharge. Left eye exhibits no discharge.  Neck: Normal range of motion. Neck supple.  Cardiovascular: Normal rate, regular rhythm, normal heart sounds and intact distal pulses.  Exam reveals no gallop and no friction rub.   No murmur heard. Pulmonary/Chest: Effort normal. No respiratory distress. He has no decreased breath sounds. He has wheezes. He has rhonchi. He has no rales.  Course lung sounds scattered throughout without focal consolidation, diffuse expiratory wheezing throughout, no rales, no hypoxia or increased WOB, speaking in full sentences, SpO2 95% on RA   Abdominal: Soft. Normal appearance and bowel sounds are normal. He exhibits no distension. There is no tenderness. There is no rigidity, no rebound, no guarding, no CVA tenderness, no tenderness at McBurney's point and negative Murphy's sign.  Musculoskeletal: Normal range of motion.  Neurological: He is alert and oriented to person, place, and time. He has normal strength. No sensory deficit.  Skin: Skin is warm, dry and intact. No rash noted.  Psychiatric: He has a normal  mood and affect.  Nursing note and vitals reviewed.   ED Course  Procedures (including critical care time) Labs Review Labs Reviewed  BASIC METABOLIC PANEL - Abnormal; Notable for the following:    Sodium 132 (*)    Chloride 99 (*)    Glucose, Bld 304 (*)    All other components within normal limits  CBC - Abnormal; Notable for the following:    HCT 38.8 (*)    All other components within normal limits  CBG MONITORING, ED - Abnormal; Notable for the following:    Glucose-Capillary 299 (*)    All other components within normal limits    Imaging Review Dg Chest 2 View  05/13/2016  CLINICAL DATA:  Cough and rhonchi. EXAM: CHEST  2 VIEW COMPARISON:  None. FINDINGS: Normal heart size. Normal mediastinal contour. No pneumothorax. No pleural effusion. Lungs appear clear, with no acute consolidative airspace disease and no pulmonary edema. IMPRESSION: No active cardiopulmonary disease. Electronically Signed   By: Delbert Phenix M.D.   On: 05/13/2016 15:51   I have personally reviewed and evaluated these images and lab results as part of my medical decision-making.   EKG Interpretation None      MDM   Final diagnoses:  Bronchitis  Cough  URI (upper respiratory  infection)  Type 2 diabetes mellitus with hyperglycemia, unspecified long term insulin use status (HCC)  Wheezing    55 y.o. male here with concern for increasing blood sugars x 1 day, and cough/wheezing/sinus congestion x2-3 days. Refers to being in the behavioral [facility] when he was diagnosed with diabetes and started on multiple medications, but he can't recall the names of the meds, and there are no visits in the system where he has been seen before. On exam, lung sounds course and with wheezing throughout all lung fields, no hypoxia or tachycardia, no complaints of CP/SOB, doubt PE/ACS/etc. Likely bronchitis.+Smoker. Will give neb tx and get CXR, will hold off on prednisone as this would increase his sugars even more  and I'm not convinced he needs it since he has no formal diagnosis of asthma that he knows of. He has a PCP but states he hasn't gone there yet to get refills on his inhaler. Labs done prior to my arrival show BMP with gluc 304 and Na falsely low at 132 which corrects with hyperglycemia, no anion gap or abnormal bicarb, doubt DKA/HHS. CBC unremarkable. Will cancel U/A since I do not feel this is necessary for evaluation of his high blood sugars. Likely cause is the infectious process that's going on, which can cause sugars to go up. URI is likely viral, pt afebrile and well appearing. Will await CXR and give nebs, then recheck. Doubt we need to change his diabetes regimen, especially since I don't know all of what he's on.   4:46 PM Received a call from nursing stating that he finished his neb and then stated he wanted to leave, and then left the dept immediately after, I did not have a chance to go recheck him or give him d/c paperwork and his rx for inhaler. Will leave it in the bin in case he returns, but as of now he has eloped. Of note, CXR clear, likely viral URI, would be discharging him with inhaler and PCP f/up. Pt stable at time of elopement according to nursing report.  BP 144/90 mmHg  Pulse 87  Temp(Src) 98.5 F (36.9 C) (Oral)  Resp 18  Ht  (1.854 m)  Wt 95.255 kg  BMI 27.71 kg/m2  SpO2 95%  Meds ordered this encounter  Medications  . albuterol (PROVENTIL) (2.5 MG/3ML) 0.083% nebulizer solution 5 mg    Sig:   . ipratropium (ATROVENT) nebulizer solution 0.5 mg    Sig:   . albuterol (PROVENTIL HFA;VENTOLIN HFA) 108 (90 Base) MCG/ACT inhaler    Sig: Inhale 2 puffs into the lungs every 4 (four) hours as needed for wheezing or shortness of breath (cough).    Dispense:  1 Inhaler    Refill:  0    Order Specific Question:  Supervising Provider    Answer:  Eber Hong [[id number]]       Natayla Cadenhead Camprubi-Soms, PA-C 05/13/16 1648  Benjiman Core, MD 05/13/16 1750

## 2016-05-13 NOTE — ED Notes (Signed)
Pt cannot use restroom at this time, aware urine specimen is needed.  

## 2016-05-13 NOTE — ED Notes (Signed)
According to EMS, pt stated he felt like his sugars were high. EMS obtained a BSG 305. Pt states he has been taking his Rx'd Metformin 1g a day. Pt arrives A+OX4, speaking in complete sentences, ambulatory to triage.

## 2016-05-13 NOTE — Discharge Instructions (Signed)
Continue to stay well-hydrated with PLENTY OF WATER. Continue taking your home medications, especially your diabetes medications, as directed. Continue to alternate between Tylenol and Ibuprofen for pain or fever. Use Mucinex for cough suppression/expectoration of mucus. Use netipot and flonase to help with nasal congestion. May consider over-the-counter Benadryl or other antihistamine to decrease secretions and for watery itchy eyes. Use inhaler as directed, as needed for cough/chest congestion/wheezing. Followup with your primary care doctor in 5-7 days for recheck of ongoing symptoms and ongoing medical care. Return to emergency department for emergent changing or worsening of symptoms.   Cough, Adult A cough helps to clear your throat and lungs. A cough may last only 2-3 weeks (acute), or it may last longer than 8 weeks (chronic). Many different things can cause a cough. A cough may be a sign of an illness or another medical condition. HOME CARE  Pay attention to any changes in your cough.  Take medicines only as told by your doctor.  If you were prescribed an antibiotic medicine, take it as told by your doctor. Do not stop taking it even if you start to feel better.  Talk with your doctor before you try using a cough medicine.  Drink enough fluid to keep your pee (urine) clear or pale yellow.  If the air is dry, use a cold steam vaporizer or humidifier in your home.  Stay away from things that make you cough at work or at home.  If your cough is worse at night, try using extra pillows to raise your head up higher while you sleep.  Do not smoke, and try not to be around smoke. If you need help quitting, ask your doctor.  Do not have caffeine.  Do not drink alcohol.  Rest as needed. GET HELP IF:  You have new problems (symptoms).  You cough up yellow fluid (pus).  Your cough does not get better after 2-3 weeks, or your cough gets worse.  Medicine does not help your cough and  you are not sleeping well.  You have pain that gets worse or pain that is not helped with medicine.  You have a fever.  You are losing weight and you do not know why.  You have night sweats. GET HELP RIGHT AWAY IF:  You cough up blood.  You have trouble breathing.  Your heartbeat is very fast.   This information is not intended to replace advice given to you by your health care provider. Make sure you discuss any questions you have with your health care provider.   Document Released: 08/23/2011 Document Revised: 08/31/2015 Document Reviewed: 02/16/2015 Elsevier Interactive Patient Education 2016 [employer].  How to Use an Inhaler Using your inhaler correctly is very important. Good technique will make sure that the medicine reaches your lungs.  HOW TO USE AN INHALER:  Take the cap off the inhaler.  If this is the first time using your inhaler, you need to prime it. Shake the inhaler for 5 seconds. Release four puffs into the air, away from your face. Ask your doctor for help if you have questions.  Shake the inhaler for 5 seconds.  Turn the inhaler so the bottle is above the mouthpiece.  Put your pointer finger on top of the bottle. Your thumb holds the bottom of the inhaler.  Open your mouth.  Either hold the inhaler away from your mouth (the width of 2 fingers) or place your lips tightly around the mouthpiece. Ask your doctor which way  to use your inhaler.  Breathe out as much air as possible.  Breathe in and push down on the bottle 1 time to release the medicine. You will feel the medicine go in your mouth and throat.  Continue to take a deep breath in very slowly. Try to fill your lungs.  After you have breathed in completely, hold your breath for 10 seconds. This will help the medicine to settle in your lungs. If you cannot hold your breath for 10 seconds, hold it for as long as you can before you breathe out.  Breathe out slowly, through pursed lips. Whistling  is an example of pursed lips.  If your doctor has told you to take more than 1 puff, wait at least 15-30 seconds between puffs. This will help you get the best results from your medicine. Do not use the inhaler more than your doctor tells you to.  Put the cap back on the inhaler.  Follow the directions from your doctor or from the inhaler package about cleaning the inhaler. If you use more than one inhaler, ask your doctor which inhalers to use and what order to use them in. Ask your doctor to help you figure out when you will need to refill your inhaler.  If you use a steroid inhaler, always rinse your mouth with water after your last puff, gargle and spit out the water. Do not swallow the water. GET HELP IF:  The inhaler medicine only partially helps to stop wheezing or shortness of breath.  You are having trouble using your inhaler.  You have some increase in thick spit (phlegm). GET HELP RIGHT AWAY IF:  The inhaler medicine does not help your wheezing or shortness of breath or you have tightness in your chest.  You have dizziness, headaches, or fast heart rate.  You have chills, fever, or night sweats.  You have a large increase of thick spit, or your thick spit is bloody. MAKE SURE YOU:   Understand these instructions.  Will watch your condition.  Will get help right away if you are not doing well or get worse.   This information is not intended to replace advice given to you by your health care provider. Make sure you discuss any questions you have with your health care provider.   Document Released: 09/18/2008 Document Revised: 09/30/2013 Document Reviewed: 07/09/2013 Elsevier Interactive Patient Education 2016 [employer].  How to Avoid Diabetes Problems You can do a lot to prevent or slow down diabetes problems. Following your diabetes plan and taking care of yourself can reduce your risk of serious or life-threatening complications. Below, you will find certain  things you can do to prevent diabetes problems. MANAGE YOUR DIABETES Follow your health care provider's, nurse educator's, and dietitian's instructions for managing your diabetes. They will teach you the basics of diabetes care. They can help answer questions you may have. Learn about diabetes and make healthy choices regarding eating and physical activity. Monitor your blood glucose level regularly. Your health care provider will help you decide how often to check your blood glucose level depending on your treatment goals and how well you are meeting them.  DO NOT USE NICOTINE Nicotine and diabetes are a dangerous combination. Nicotine raises your risk for diabetes problems. If you quit using nicotine, you will lower your risk for heart attack, stroke, nerve disease, and kidney disease. Your cholesterol and your blood pressure levels may improve. Your blood circulation will also improve. Do not use any tobacco  products, including cigarettes, chewing tobacco, or electronic cigarettes. If you need help quitting, ask your health care provider. KEEP YOUR BLOOD PRESSURE UNDER CONTROL Your health care provider will determine your individualized target blood pressure based on your age, your medicines, how long you have had diabetes, and any other medical conditions you have. Blood pressure consists of two numbers. Generally, the goal is to keep your top number (systolic pressure) at or below 130, and your bottom number (diastolic pressure) at or below 80. Your health care provider may recommend a lower target blood pressure reading, if appropriate. Meal planning, medicines, and exercise can help you reach your target blood pressure. Make sure your health care provider checks your blood pressure at every visit. KEEP YOUR CHOLESTEROL UNDER CONTROL Normal cholesterol levels will help prevent heart disease and stroke. These are the biggest health problems for people with diabetes. Keeping cholesterol levels under  control can also help with blood flow. Have your cholesterol level checked at least once a year. Your health care provider may prescribe a medicine known as a statin. Statins lower your cholesterol. If you are not taking a statin, ask your health care provider if you should be. Meal planning, exercise, and medicines can help you reach your cholesterol targets.  SCHEDULE AND KEEP YOUR ANNUAL PHYSICAL EXAMS AND EYE EXAMS Your health care provider will tell you how often he or she wants to see you depending on your plan of treatment. It is important that you keep these appointments so that possible problems can be identified early and complications can be avoided or treated.  Every visit with your health care provider should include your weight, blood pressure, and an evaluation of your blood glucose control.  Your hemoglobin A1c should be checked:  At least twice a year if you are at your goal.  Every 3 months if there are changes in treatment.  If you are not meeting your goals.  Your blood lipids should be checked yearly. You should also be checked yearly to see if you have protein in your urine (microalbumin).  Schedule a dilated eye exam within 5 years of your diagnosis if you have type 1 diabetes, and then yearly. Schedule a dilated eye exam at diagnosis if you have type 2 diabetes, and then yearly. All exams thereafter can be extended to every 2 to 3 years if one or more exams have been normal. KEEP YOUR VACCINES CURRENT It is recommended that you receive a flu (influenza) vaccine every year. It is also recommended that you receive a pneumonia (pneumococcal) vaccine. If you are 55 years of age or older and have never received a pneumonia vaccine, this vaccine may be given as a series of two separate shots. Ask your health care provider which additional vaccines may be recommended. TAKE CARE OF YOUR FEET  Diabetes may cause you to have a poor blood supply (circulation) to your legs and feet.  Because of this, the skin may be thinner, break easier, and heal more slowly. You also may have nerve damage in your legs and feet, causing decreased feeling. You may not notice minor injuries to your feet that could lead to serious problems or infections. Taking care of your feet is very important. Visual foot exams are performed at every routine medical visit. The exams check for cuts, injuries, or other problems with the feet. A comprehensive foot exam should be done yearly. This includes visual inspection as well as assessing foot pulses and testing for loss of sensation.  You should also do the following:  Inspect your feet daily for cuts, calluses, blisters, ingrown toenails, and signs of infection, such as redness, swelling, or pus.  Wash and dry your feet thoroughly, especially between the toes.  Avoid soaking your feet regularly in hot water baths.  Moisturize dry skin with lotion, avoiding areas between your toes.  Cut toenails straight across and file the edges.  Avoid shoes that do not fit well or have areas that irritate your skin.  Avoid going barefooted or wearing only socks. Your feet need protection. TAKE CARE OF YOUR TEETH People with poorly controlled diabetes are more likely to have gum (periodontal) disease. These infections make diabetes harder to control. Periodontal diseases, if left untreated, can lead to tooth loss. Brush your teeth twice a day, floss, and see your dentist for checkups and cleaning every 6 months, or 2 times a year. ASK YOUR HEALTH CARE PROVIDER ABOUT TAKING ASPIRIN Taking aspirin daily is recommended to help prevent cardiovascular disease in people with and without diabetes. Ask your health care provider if this would benefit you and what dose he or she would recommend. DRINK RESPONSIBLY Moderate amounts of alcohol (less than 1 drink per day for adult women and less than 2 drinks per day for adult men) have a minimal effect on blood glucose if ingested  with food. It is important to eat food with alcohol to avoid hypoglycemia. People should avoid alcohol if they have a history of alcohol abuse or dependence, if they are pregnant, and if they have liver disease, pancreatitis, advanced neuropathy, or severe hypertriglyceridemia. LESSEN STRESS Living with diabetes can be stressful. When you are under stress, your blood glucose may be affected in two ways:  Stress hormones may cause your blood glucose to rise.  You may be distracted from taking good care of yourself. It is a good idea to be aware of your stress level and make changes that are necessary to help you better manage challenging situations. Support groups, planned relaxation, a hobby you enjoy, meditation, healthy relationships, and exercise all work to lower your stress level. If your efforts do not seem to be helping, get help from your health care provider or a trained mental health professional.   This information is not intended to replace advice given to you by your health care provider. Make sure you discuss any questions you have with your health care provider.   Document Released: 08/28/2011 Document Revised: 12/31/2014 Document Reviewed: 02/03/2014 Elsevier Interactive Patient Education 2016 Elsevier Inc.  Viral Infections A virus is a type of germ. Viruses can cause:  Minor sore throats.  Aches and pains.  Headaches.  Runny nose.  Rashes.  Watery eyes.  Tiredness.  Coughs.  Loss of appetite.  Feeling sick to your stomach (nausea).  Throwing up (vomiting).  Watery poop (diarrhea). HOME CARE   Only take medicines as told by your doctor.  Drink enough water and fluids to keep your pee (urine) clear or pale yellow. Sports drinks are a good choice.  Get plenty of rest and eat healthy. Soups and broths with crackers or rice are fine. GET HELP RIGHT AWAY IF:   You have a very bad headache.  You have shortness of breath.  You have chest pain or neck  pain.  You have an unusual rash.  You cannot stop throwing up.  You have watery poop that does not stop.  You cannot keep fluids down.  You or your child has a temperature by  mouth above 102 F (38.9 C), not controlled by medicine.  Your baby is older than 3 months with a rectal temperature of 102 F (38.9 C) or higher.  Your baby is 53 months old or younger with a rectal temperature of 100.4 F (38 C) or higher. MAKE SURE YOU:   Understand these instructions.  Will watch this condition.  Will get help right away if you are not doing well or get worse.   This information is not intended to replace advice given to you by your health care provider. Make sure you discuss any questions you have with your health care provider.   Document Released: 11/22/2008 Document Revised: 03/03/2012 Document Reviewed: 05/18/2015 Elsevier Interactive Patient Education [employer]

## 2016-05-29 ENCOUNTER — Emergency Department (HOSPITAL_COMMUNITY)
Admission: EM | Admit: 2016-05-29 | Discharge: 2016-05-29 | Discharge status: Against Medical Advice | Payer: Medicaid Other | Attending: Dermatology | Admitting: Dermatology

## 2016-05-29 DIAGNOSIS — E1140 Type 2 diabetes mellitus with diabetic neuropathy, unspecified: Secondary | ICD-10-CM | POA: Diagnosis not present

## 2016-05-29 DIAGNOSIS — F17200 Nicotine dependence, unspecified, uncomplicated: Secondary | ICD-10-CM | POA: Diagnosis not present

## 2016-05-29 DIAGNOSIS — Z5321 Procedure and treatment not carried out due to patient leaving prior to being seen by health care provider: Secondary | ICD-10-CM | POA: Diagnosis not present

## 2016-05-29 DIAGNOSIS — R42 Dizziness and giddiness: Secondary | ICD-10-CM | POA: Diagnosis not present

## 2016-05-29 DIAGNOSIS — I10 Essential (primary) hypertension: Secondary | ICD-10-CM | POA: Diagnosis not present

## 2016-05-29 LAB — CBG MONITORING, ED: GLUCOSE-CAPILLARY: 229 mg/dL — AB (ref 65–99)

## 2016-05-29 NOTE — ED Notes (Addendum)
  Pt is refusing all vital signs, EKG and monitoring.

## 2016-05-29 NOTE — ED Notes (Addendum)
Pt BIB EMS after being dizzy today. States that he is normally dizzy but it is worse today. Ambulatory. Refusing care from EMS PTA (ie. EKG). Orthostatic BP changes. CBG 225. Alert and oriented.

## 2016-05-29 NOTE — ED Notes (Signed)
Bed: WA02 Expected date:  Expected time:  Means of arrival:  Comments: EMS 55 yo male dizziness

## 2016-05-29 NOTE — ED Notes (Signed)
Pt is refusing blood draws, RN notified

## 2016-05-29 NOTE — ED Notes (Signed)
Pt refused all care except for blood pressure unless he could do it standing. After getting his blood pressure taken he stated that he'd 'just rather go to the house.' Pt was ambulatory and walked with a steady gait upon leaving.

## 2016-10-29 ENCOUNTER — Inpatient Hospital Stay (HOSPITAL_COMMUNITY)
Admission: AD | Admit: 2016-10-29 | Discharge: 2016-11-05 | DRG: 885 | Disposition: A | Discharge status: Home / Self Care | Payer: Medicaid Other | Attending: Psychiatry | Admitting: Psychiatry

## 2016-10-29 ENCOUNTER — Encounter (HOSPITAL_COMMUNITY): Payer: Self-pay

## 2016-10-29 DIAGNOSIS — Z808 Family history of malignant neoplasm of other organs or systems: Secondary | ICD-10-CM | POA: Diagnosis not present

## 2016-10-29 DIAGNOSIS — J45909 Unspecified asthma, uncomplicated: Secondary | ICD-10-CM | POA: Diagnosis present

## 2016-10-29 DIAGNOSIS — E1140 Type 2 diabetes mellitus with diabetic neuropathy, unspecified: Secondary | ICD-10-CM | POA: Diagnosis present

## 2016-10-29 DIAGNOSIS — Z79899 Other long term (current) drug therapy: Secondary | ICD-10-CM | POA: Diagnosis not present

## 2016-10-29 DIAGNOSIS — K259 Gastric ulcer, unspecified as acute or chronic, without hemorrhage or perforation: Secondary | ICD-10-CM | POA: Diagnosis present

## 2016-10-29 DIAGNOSIS — Z888 Allergy status to other drugs, medicaments and biological substances status: Secondary | ICD-10-CM

## 2016-10-29 DIAGNOSIS — Z7984 Long term (current) use of oral hypoglycemic drugs: Secondary | ICD-10-CM | POA: Diagnosis not present

## 2016-10-29 DIAGNOSIS — Z915 Personal history of self-harm: Secondary | ICD-10-CM

## 2016-10-29 DIAGNOSIS — I10 Essential (primary) hypertension: Secondary | ICD-10-CM | POA: Diagnosis present

## 2016-10-29 DIAGNOSIS — Z8249 Family history of ischemic heart disease and other diseases of the circulatory system: Secondary | ICD-10-CM

## 2016-10-29 DIAGNOSIS — E7800 Pure hypercholesterolemia, unspecified: Secondary | ICD-10-CM | POA: Diagnosis present

## 2016-10-29 DIAGNOSIS — Z9114 Patient's other noncompliance with medication regimen: Secondary | ICD-10-CM

## 2016-10-29 DIAGNOSIS — G4700 Insomnia, unspecified: Secondary | ICD-10-CM | POA: Diagnosis present

## 2016-10-29 DIAGNOSIS — F200 Paranoid schizophrenia: Secondary | ICD-10-CM | POA: Diagnosis present

## 2016-10-29 DIAGNOSIS — E119 Type 2 diabetes mellitus without complications: Secondary | ICD-10-CM

## 2016-10-29 DIAGNOSIS — F411 Generalized anxiety disorder: Secondary | ICD-10-CM | POA: Diagnosis present

## 2016-10-29 DIAGNOSIS — Z811 Family history of alcohol abuse and dependence: Secondary | ICD-10-CM | POA: Diagnosis not present

## 2016-10-29 DIAGNOSIS — Z590 Homelessness: Secondary | ICD-10-CM | POA: Diagnosis not present

## 2016-10-29 DIAGNOSIS — F17210 Nicotine dependence, cigarettes, uncomplicated: Secondary | ICD-10-CM | POA: Diagnosis present

## 2016-10-29 MED ORDER — HYDROXYZINE HCL 25 MG PO TABS: 25.0000 mg | ORAL_TABLET | Freq: Four times a day (QID) | ORAL | Status: DC | PRN

## 2016-10-29 MED ORDER — TRIHEXYPHENIDYL HCL 2 MG PO TABS
2.0000 mg | ORAL_TABLET | Freq: Every day | ORAL | Status: DC
  Administered 2016-10-30: 2 mg via ORAL
  Filled 2016-10-29 (×3): qty 1

## 2016-10-29 MED ORDER — ALBUTEROL SULFATE HFA 108 (90 BASE) MCG/ACT IN AERS: 2.0000 | INHALATION_SPRAY | RESPIRATORY_TRACT | Status: DC | PRN

## 2016-10-29 MED ORDER — RISPERIDONE 1 MG PO TABS
1.0000 mg | ORAL_TABLET | Freq: Every day | ORAL | Status: DC
  Administered 2016-10-30: 1 mg via ORAL
  Filled 2016-10-29 (×3): qty 1

## 2016-10-29 MED ORDER — GABAPENTIN 300 MG PO CAPS
300.0000 mg | ORAL_CAPSULE | Freq: Three times a day (TID) | ORAL | Status: DC
  Administered 2016-10-30 – 2016-11-01 (×8): 300 mg via ORAL
  Filled 2016-10-29 (×14): qty 1

## 2016-10-29 MED ORDER — ACETAMINOPHEN 325 MG PO TABS
650.0000 mg | ORAL_TABLET | Freq: Four times a day (QID) | ORAL | Status: DC | PRN
  Administered 2016-10-31 – 2016-11-05 (×3): 650 mg via ORAL
  Filled 2016-10-29 (×3): qty 2

## 2016-10-29 MED ORDER — LINAGLIPTIN 5 MG PO TABS
5.0000 mg | ORAL_TABLET | Freq: Every day | ORAL | Status: DC
  Administered 2016-10-30 – 2016-11-05 (×7): 5 mg via ORAL
  Filled 2016-10-29 (×9): qty 1

## 2016-10-29 MED ORDER — ATORVASTATIN CALCIUM 20 MG PO TABS
20.0000 mg | ORAL_TABLET | Freq: Every day | ORAL | Status: DC
  Administered 2016-10-30 – 2016-11-05 (×7): 20 mg via ORAL
  Filled 2016-10-29 (×2): qty 1
  Filled 2016-10-29: qty 2
  Filled 2016-10-29 (×6): qty 1
  Filled 2016-10-29: qty 2

## 2016-10-29 MED ORDER — AMLODIPINE BESYLATE 5 MG PO TABS
5.0000 mg | ORAL_TABLET | Freq: Every day | ORAL | Status: DC
  Administered 2016-10-30 – 2016-11-05 (×7): 5 mg via ORAL
  Filled 2016-10-29 (×9): qty 1

## 2016-10-29 MED ORDER — HYDROCHLOROTHIAZIDE 25 MG PO TABS
25.0000 mg | ORAL_TABLET | Freq: Every day | ORAL | Status: DC
  Administered 2016-10-30 – 2016-11-05 (×7): 25 mg via ORAL
  Filled 2016-10-29 (×9): qty 1

## 2016-10-29 MED ORDER — MAGNESIUM HYDROXIDE 400 MG/5ML PO SUSP: 30.0000 mL | Freq: Every day | ORAL | Status: DC | PRN

## 2016-10-29 MED ORDER — METFORMIN HCL 500 MG PO TABS
1000.0000 mg | ORAL_TABLET | Freq: Two times a day (BID) | ORAL | Status: DC
  Administered 2016-10-30 – 2016-11-05 (×13): 1000 mg via ORAL
  Filled 2016-10-29 (×16): qty 2

## 2016-10-29 MED ORDER — QUETIAPINE FUMARATE 50 MG PO TABS
50.0000 mg | ORAL_TABLET | Freq: Every evening | ORAL | Status: DC | PRN
  Filled 2016-10-29 (×3): qty 1

## 2016-10-29 MED ORDER — PANTOPRAZOLE SODIUM 40 MG PO TBEC
40.0000 mg | DELAYED_RELEASE_TABLET | Freq: Every day | ORAL | Status: DC
  Administered 2016-10-30 – 2016-11-05 (×7): 40 mg via ORAL
  Filled 2016-10-29 (×8): qty 1
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
## 2016-10-29 NOTE — Tx Team (Signed)
Initial Treatment Plan 10/29/2016 11:36 PM Oddis Talmadge [place] ZOX:[id number]    PATIENT STRESSORS: Health problems Marital or family conflict Medication change or noncompliance   PATIENT STRENGTHS: Financial means General fund of knowledge   PATIENT IDENTIFIED PROBLEMS: Paranoia  Aggression    "I want to get my meds straightened out"  "Get some nice clothes, nice apartment and have money"               DISCHARGE CRITERIA:  Improved stabilization in mood, thinking, and/or behavior Verbal commitment to aftercare and medication compliance  PRELIMINARY DISCHARGE PLAN: Outpatient therapy Medication management  PATIENT/FAMILY INVOLVEMENT: This treatment plan has been presented to and reviewed with the patient, Dylan Pullerapoleon Boyd.  The patient and family have been given the opportunity to ask questions and make suggestions.  Levin BaconHeather V Horst Ostermiller, RN 10/29/2016, 11:36 PM

## 2016-10-30 ENCOUNTER — Encounter (HOSPITAL_COMMUNITY): Payer: Self-pay | Admitting: Psychiatry

## 2016-10-30 DIAGNOSIS — Z888 Allergy status to other drugs, medicaments and biological substances status: Secondary | ICD-10-CM

## 2016-10-30 DIAGNOSIS — I10 Essential (primary) hypertension: Secondary | ICD-10-CM | POA: Diagnosis present

## 2016-10-30 DIAGNOSIS — E119 Type 2 diabetes mellitus without complications: Secondary | ICD-10-CM

## 2016-10-30 DIAGNOSIS — Z794 Long term (current) use of insulin: Secondary | ICD-10-CM

## 2016-10-30 LAB — COMPREHENSIVE METABOLIC PANEL
ALT: 19 U/L (ref 17–63)
AST: 18 U/L (ref 15–41)
Albumin: 4.4 g/dL (ref 3.5–5.0)
Alkaline Phosphatase: 64 U/L (ref 38–126)
Anion gap: 11 (ref 5–15)
BUN: 21 mg/dL — ABNORMAL HIGH (ref 6–20)
CHLORIDE: 99 mmol/L — AB (ref 101–111)
CO2: 26 mmol/L (ref 22–32)
CREATININE: 1.08 mg/dL (ref 0.61–1.24)
Calcium: 9.8 mg/dL (ref 8.9–10.3)
Glucose, Bld: 153 mg/dL — ABNORMAL HIGH (ref 65–99)
Potassium: 4.2 mmol/L (ref 3.5–5.1)
Sodium: 136 mmol/L (ref 135–145)
TOTAL PROTEIN: 7.5 g/dL (ref 6.5–8.1)
Total Bilirubin: 0.8 mg/dL (ref 0.3–1.2)

## 2016-10-30 LAB — URINALYSIS, ROUTINE W REFLEX MICROSCOPIC
BILIRUBIN URINE: NEGATIVE
GLUCOSE, UA: 500 mg/dL — AB
HGB URINE DIPSTICK: NEGATIVE
Ketones, ur: NEGATIVE mg/dL
Leukocytes, UA: NEGATIVE
Nitrite: NEGATIVE
PH: 5.5 (ref 5.0–8.0)
Protein, ur: NEGATIVE mg/dL
SPECIFIC GRAVITY, URINE: 1.008 (ref 1.005–1.030)

## 2016-10-30 LAB — HEPATIC FUNCTION PANEL
ALBUMIN: 4.4 g/dL (ref 3.5–5.0)
ALK PHOS: 62 U/L (ref 38–126)
ALT: 18 U/L (ref 17–63)
AST: 20 U/L (ref 15–41)
BILIRUBIN TOTAL: 0.9 mg/dL (ref 0.3–1.2)
Bilirubin, Direct: <0.1 mg/dL — ABNORMAL LOW (ref 0.1–0.5)
Total Protein: 7.8 g/dL (ref 6.5–8.1)

## 2016-10-30 LAB — CBC
HCT: 47.6 % (ref 39.0–52.0)
Hemoglobin: 16.2 g/dL (ref 13.0–17.0)
MCH: 29.1 pg (ref 26.0–34.0)
MCHC: 34 g/dL (ref 30.0–36.0)
MCV: 85.5 fL (ref 78.0–100.0)
PLATELETS: 170 10*3/uL (ref 150–400)
RBC: 5.57 MIL/uL (ref 4.22–5.81)
RDW: 13.8 % (ref 11.5–15.5)
WBC: 11.4 10*3/uL — ABNORMAL HIGH (ref 4.0–10.5)

## 2016-10-30 LAB — LIPID PANEL
CHOLESTEROL: 149 mg/dL (ref 0–200)
HDL: 54 mg/dL (ref >40)
LDL Cholesterol: 74 mg/dL (ref 0–99)
TRIGLYCERIDES: 105 mg/dL (ref <150)
Total CHOL/HDL Ratio: 2.8 RATIO
VLDL: 21 mg/dL (ref 0–40)

## 2016-10-30 LAB — TSH: TSH: 1.467 u[IU]/mL (ref 0.350–4.500)

## 2016-10-30 LAB — GLUCOSE, CAPILLARY
GLUCOSE-CAPILLARY: 177 mg/dL — AB (ref 65–99)
Glucose-Capillary: 158 mg/dL — ABNORMAL HIGH (ref 65–99)
Glucose-Capillary: 108 mg/dL — ABNORMAL HIGH (ref 65–99)
Glucose-Capillary: 163 mg/dL — ABNORMAL HIGH (ref 65–99)

## 2016-10-30 MED ORDER — QUETIAPINE FUMARATE 25 MG PO TABS
25.0000 mg | ORAL_TABLET | Freq: Three times a day (TID) | ORAL | Status: DC | PRN
  Administered 2016-10-31 – 2016-11-01 (×2): 25 mg via ORAL
  Filled 2016-10-30 (×2): qty 1

## 2016-10-30 MED ORDER — INSULIN ASPART 100 UNIT/ML ~~LOC~~ SOLN
0.0000 [IU] | Freq: Three times a day (TID) | SUBCUTANEOUS | Status: DC
  Administered 2016-10-30 – 2016-10-31 (×4): 2 [IU] via SUBCUTANEOUS
  Administered 2016-11-01 (×2): 1 [IU] via SUBCUTANEOUS
  Administered 2016-11-01 – 2016-11-03 (×3): 2 [IU] via SUBCUTANEOUS
  Administered 2016-11-03 – 2016-11-04 (×2): 3 [IU] via SUBCUTANEOUS
  Administered 2016-11-04: 2 [IU] via SUBCUTANEOUS
  Administered 2016-11-05 (×2): 1 [IU] via SUBCUTANEOUS

## 2016-10-30 MED ORDER — ZIPRASIDONE HCL 40 MG PO CAPS
40.0000 mg | ORAL_CAPSULE | Freq: Two times a day (BID) | ORAL | Status: DC
  Administered 2016-10-30 – 2016-11-01 (×4): 40 mg via ORAL
  Filled 2016-10-30 (×8): qty 1

## 2016-10-30 MED ORDER — ENSURE ENLIVE PO LIQD
237.0000 mL | Freq: Two times a day (BID) | ORAL | Status: DC
  Administered 2016-10-30 – 2016-11-05 (×10): 237 mL via ORAL

## 2016-10-30 MED ORDER — NICOTINE POLACRILEX 2 MG MT GUM
2.0000 mg | CHEWING_GUM | OROMUCOSAL | Status: DC | PRN
  Administered 2016-10-30 – 2016-11-05 (×16): 2 mg via ORAL
  Filled 2016-10-30 (×11): qty 1

## 2016-10-30 MED ORDER — INSULIN ASPART 100 UNIT/ML ~~LOC~~ SOLN: 0.0000 [IU] | Freq: Every day | SUBCUTANEOUS | Status: DC

## 2016-10-30 MED ORDER — TRIHEXYPHENIDYL HCL 2 MG PO TABS
2.0000 mg | ORAL_TABLET | Freq: Every day | ORAL | Status: DC
  Administered 2016-10-31 – 2016-11-04 (×5): 2 mg via ORAL
  Filled 2016-10-30 (×6): qty 1

## 2016-10-30 MED ORDER — TRAZODONE HCL 100 MG PO TABS
100.0000 mg | ORAL_TABLET | Freq: Every day | ORAL | Status: DC
  Administered 2016-10-30 – 2016-11-04 (×6): 100 mg via ORAL
  Filled 2016-10-30 (×7): qty 1

## 2016-10-30 MED ORDER — QUETIAPINE FUMARATE 100 MG PO TABS: 100.0000 mg | ORAL_TABLET | Freq: Every day | ORAL | Status: DC
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
## 2016-10-30 NOTE — [facility] Suicide Risk Assessment (Signed)
[facility]   Nursing information obtained from:  Patient Demographic factors:  Male, Low socioeconomic status, Living alone Current Mental Status:  NA Loss Factors:  Loss of significant relationship, Decline in physical health Historical Factors:  Prior suicide attempts, Impulsivity Risk Reduction Factors:  NA  Total Time spent with patient: 30 minutes Principal Problem: Paranoid schizophrenia, chronic condition with acute exacerbation (HCC) Diagnosis:   Patient Active Problem List   Diagnosis Date Noted  . Diabetes mellitus (HCC) [E11.9] 10/30/2016  . Essential hypertension [I10] 10/30/2016  . Paranoid schizophrenia, chronic condition with acute exacerbation (HCC) [F20.0] 10/29/2016   Subjective Data: Please see H&P.   Continued Clinical Symptoms:  Alcohol Use Disorder Identification Test Final Score (AUDIT): 0 The "Alcohol Use Disorders Identification Test", Guidelines for Use in Primary Care, Second Edition.  World Science writerHealth Organization [facility](WHO). Score between 0-7:  no or low risk or alcohol related problems. Score between 8-15:  moderate risk of alcohol related problems. Score between 16-19:  high risk of alcohol related problems. Score 20 or above:  warrants further diagnostic evaluation for alcohol dependence and treatment.   CLINICAL FACTORS:   Schizophrenia:   Paranoid or undifferentiated type   Musculoskeletal: Strength & Muscle Tone: within normal limits Gait & Station: normal Patient leans: N/A  Psychiatric Specialty Exam: Physical Exam  Nursing note and vitals reviewed.   Review of Systems  Psychiatric/Behavioral: Positive for depression. The patient is nervous/anxious and has insomnia.   All other systems reviewed and are negative.   Blood pressure 104/79, pulse (!) 138, temperature 97.8 F (36.6 C), temperature source Oral, resp. rate 18, height 5' 11.5" (1.816 m), weight 76.2 kg (168 lb), SpO2 97 %.Body mass index is 23.1 kg/m.                Please see H&P.                                           COGNITIVE FEATURES THAT CONTRIBUTE TO RISK:  Closed-mindedness, Polarized thinking and Thought constriction (tunnel vision)    SUICIDE RISK:   Mild:  Suicidal ideation of limited frequency, intensity, duration, and specificity.  There are no identifiable plans, no associated intent, mild dysphoria and related symptoms, good self-control (both objective and subjective assessment), few other risk factors, and identifiable protective factors, including available and accessible social support.   PLAN OF CARE: Please see H&P.   I certify that inpatient services furnished can reasonably be expected to improve the patient's condition.  Sabien Umland, MD 10/30/2016, 11:20 AM
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
## 2016-10-30 NOTE — Progress Notes (Signed)
Recreation Therapy Notes  Animal-Assisted Activity (AAA) Program Checklist/Progress Notes Patient Eligibility Criteria Checklist & Daily Group note for Rec Tx Intervention  Date: 11.07.2017 Time: 2:45pm [address]    AAA/T Program Assumption of Risk Form signed by Patient/ or Parent Legal Guardian Yes  Patient is free of allergies or sever asthma Yes  Patient reports no fear of animals Yes  Patient reports no history of cruelty to animals Yes  Patient understands his/her participation is voluntary Yes  Patient washes hands before animal contact Yes  Patient washes hands after animal contact Yes  Behavioral Response: Appropriate   Education: Hand Washing, Appropriate Animal Interaction   Education Outcome: Acknowledges education.   Clinical Observations/Feedback: Patient discussed with MD for appropriateness in pet therapy session. Both LRT and MD agree patient is appropriate for participation. Patient offered participation in session and signed necessary consent form without issue. Patient attended session and interacted appropriately with peers and therapy dog. Patient asked numerous questions throughout session and often asked the same question numerous times. Patient presented with pressured speech.   Marykay Lexenise L Patsey Pitstick, LRT/CTRS   Laquesha Holcomb L 10/30/2016 3:06 PM

## 2016-10-30 NOTE — Progress Notes (Signed)
D: [name] has been cooperative but alternately pleasant or irritable. He is cooperative with medications. He's eager to attend and participate in groups. He was agreeable to an EKG but irritable during lead placement, then polite moments later. He can be demanding at moments. He said he didn't want diet ginger ale this morning because diet soda "causes sterility." This was after he initially refused water with his meds. He rates his depression, anxiety, and hopelessness all zero/ten and reports good sleep, appetite, and concentration.   A: Meds given as ordered. EKG given and shared with MD. Pt made aware of need for urine sample. Cup left in bathroom, and pt voiced understanding. Q15 safety checks maintained. Support/encouragement offered.  R: Pt remains free from harm and continues with treatment. Will continue to monitor for needs/safety.

## 2016-10-30 NOTE — Progress Notes (Signed)
Adult Psychoeducational Group Note  Date:  10/30/2016 Time:  8:39 PM  Group Topic/Focus:  Wrap-Up Group:   The focus of this group is to help patients review their daily goal of treatment and discuss progress on daily workbooks.   Participation Level:  Active  Participation Quality:  Appropriate  Affect:  Appropriate  Cognitive:  Appropriate  Insight: Appropriate  Engagement in Group:  Engaged  Modes of Intervention:  Discussion  Additional Comments:  The patient expressed that he attended all groups.The patient also said that he rates his day a 10. Octavio Mannshigpen, Rayona Sardinha Lee 10/30/2016, 8:39 PM

## 2016-10-30 NOTE — [facility] Group Notes (Signed)
[facility] LCSW Group Therapy  10/31/2016 8:39 AM   Type of Therapy:  Group Therapy  Participation Level:  Active  Participation Quality:  Attentive  Affect:  Appropriate  Cognitive:  Appropriate  Insight:  Improving  Engagement in Therapy:  Engaged  Modes of Intervention:  Clarification, Education, Exploration and Socialization  Summary of Progress/Problems: Today's group focused on resilience.   [name] entered and exited multiple times.  Limited engagement.  Talked about death of family member that requires resilience.  The details were sparse. [name], Stacie Templin B 10/31/2016 , 8:39 AM
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
## 2016-10-30 NOTE — Progress Notes (Signed)
Recreation Therapy Notes  Date: 10/30/16 Time: 1000 Location: [address] Dayroom  Group Topic: Anger Management  Goal Area(s) Addresses:  Patient will identify triggers for anger.  Patient will identify physical reaction to anger.   Patient will identify benefit of using coping skills when angry.  Behavioral Response: Engaged  Intervention: Pencils, worksheet  Activity: Anger Umbrella.  Patients were to identify the feelings that are covered up by their anger and write them inside of the umbrella.  After patients identified the feelings, they were to identify coping skills to deal with those feelings a place them on the outside of umbrella.   Education: Anger Management, Discharge Planning   Education Outcome: Acknowledges education/In group clarification offered/Needs additional education.   Clinical Observations/Feedback:  Pt stated he gets angry about his money.  Pt also stated that when he is angry, he can't control what he says.  Pt left early and did not return.    Caroll RancherMarjette Dekota Shenk, LRT/CTRS        Caroll RancherLindsay, Carsen Machi A 10/30/2016 12:33 PM

## 2016-10-30 NOTE — [facility] Group Notes (Signed)
[facility] Group Notes:  (Nursing/MHT/Case Management/Adjunct)  Date:  10/30/2016  Time:  0915  Type of Therapy:  Nurse Education  Participation Level:  Active  Participation Quality:  Appropriate  Affect:  Appropriate  Cognitive:  Appropriate  Insight:  Appropriate  Engagement in Group:  Engaged  Modes of Intervention:  Discussion, Education and Socialization  Summary of Progress/Problems: Pt shared that his goal is to control his attitude and stay on his medications.  Maurine SimmeringShugart, Cerrone Debold M 10/30/2016, 10:10 AM

## 2016-10-30 NOTE — [facility] Suicide Risk Assessment (Signed)
[facility] INPATIENT:  Family/Significant Other Suicide Prevention Education  Suicide Prevention Education:  Education Completed; No one has been identified by the patient as the family member/significant other with whom the patient will be residing, and identified as the person(s) who will aid the patient in the event of a mental health crisis (suicidal ideations/suicide attempt).  With written consent from the patient, the family member/significant other has been provided the following suicide prevention education, prior to the and/or following the discharge of the patient.  The suicide prevention education provided includes the following:  Suicide risk factors  Suicide prevention and interventions  National Suicide Hotline telephone number  [facility] assessment telephone number  [facility] Emergency Assistance 911  [facility] and/or Residential Mobile Crisis Unit telephone number  Request made of family/significant other to:  Remove weapons (e.g., guns, rifles, knives), all items previously/currently identified as safety concern.    Remove drugs/medications (over-the-counter, prescriptions, illicit drugs), all items previously/currently identified as a safety concern.  The family member/significant other verbalizes understanding of the suicide prevention education information provided.  The family member/significant other agrees to remove the items of safety concern listed above. The patient did not endorse SI at the time of admission, nor did the patient c/o SI during the stay here.  SPE not required.   [name] 10/30/2016, 8:12 AM
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
## 2016-10-31 LAB — CBC WITH DIFFERENTIAL/PLATELET
Basophils Absolute: 0.1 10*3/uL (ref 0.0–0.1)
Basophils Relative: 1 %
EOS PCT: 4 %
Eosinophils Absolute: 0.4 10*3/uL (ref 0.0–0.7)
HCT: 44.8 % (ref 39.0–52.0)
Hemoglobin: 15.2 g/dL (ref 13.0–17.0)
LYMPHS ABS: 1.8 10*3/uL (ref 0.7–4.0)
LYMPHS PCT: 19 %
MCH: 29.2 pg (ref 26.0–34.0)
MCHC: 33.9 g/dL (ref 30.0–36.0)
MCV: 86 fL (ref 78.0–100.0)
MONO ABS: 0.9 10*3/uL (ref 0.1–1.0)
Monocytes Relative: 9 %
Neutro Abs: 6.7 10*3/uL (ref 1.7–7.7)
Neutrophils Relative %: 67 %
PLATELETS: 171 10*3/uL (ref 150–400)
RBC: 5.21 MIL/uL (ref 4.22–5.81)
RDW: 14 % (ref 11.5–15.5)
WBC: 9.8 10*3/uL (ref 4.0–10.5)

## 2016-10-31 LAB — HEMOGLOBIN A1C
Hgb A1c MFr Bld: 9.4 % — ABNORMAL HIGH (ref 4.8–5.6)
MEAN PLASMA GLUCOSE: 223 mg/dL

## 2016-10-31 LAB — TSH: TSH: 0.992 u[IU]/mL (ref 0.350–4.500)

## 2016-10-31 LAB — GLUCOSE, CAPILLARY
Glucose-Capillary: 177 mg/dL — ABNORMAL HIGH (ref 65–99)
Glucose-Capillary: 153 mg/dL — ABNORMAL HIGH (ref 65–99)
Glucose-Capillary: 181 mg/dL — ABNORMAL HIGH (ref 65–99)
Glucose-Capillary: 160 mg/dL — ABNORMAL HIGH (ref 65–99)

## 2016-10-31 LAB — PROLACTIN: Prolactin: 64.6 ng/mL — ABNORMAL HIGH (ref 4.0–15.2)
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
## 2016-10-31 NOTE — Progress Notes (Signed)
Recreation Therapy Notes  Date: 10/31/16 Time: 1000 Location: 500 Hall Dayroom  Group Topic: Leisure Education  Goal Area(s) Addresses:  Patient will identify positive leisure activities.  Patient will identify one positive benefit of participation in leisure activities.   Behavioral Response: Engaged  Intervention: Dry erase board, dry erase marker, eraser, various activities in a can  Activity: Leisure Pictionary.  LRT introduced to concept of leisure.  Patients were to pick a strip of paper from the can with an activity on it.  Patients were to then draw the a picture of the activity on the board.  The remainder of the group had to try a guess what was being drawn.  The person that guesses correctly, would get the next turn.  Education:  Leisure Education, Building control surveyorDischarge Planning  Education Outcome: Acknowledges education/In group clarification offered/Needs additional education  Clinical Observations/Feedback: Pt was active and engaged throughout group.  Pt stated leisure was important because "it helps you think positive and be positive".   Caroll RancherMarjette Kamaile Zachow, LRT/CTRS       Caroll RancherLindsay, Milia Warth A 10/31/2016 12:24 PM

## 2016-10-31 NOTE — Plan of Care (Signed)
Problem: Activity: Goal: Interest or engagement in activities will improve Outcome: Progressing Pt is active and engaged in groups and activities.

## 2016-10-31 NOTE — Progress Notes (Signed)
Adult Psychoeducational Group Note  Date:  10/31/2016 Time:  10:00 PM  Group Topic/Focus:  Wrap-Up Group:   The focus of this group is to help patients review their daily goal of treatment and discuss progress on daily workbooks.   Participation Level:  Active  Participation Quality:  Appropriate  Affect:  Appropriate  Cognitive:  Appropriate  Insight: Appropriate  Engagement in Group:  Engaged  Modes of Intervention:  Socialization and Support  Additional Comments:  Patient attended and participated in group today. He reports having a wonderful day. He went for meals and attended group. He went to the [facility], spoke with his doctor and his nurse.  Lita MainsFrancis, Benjamine [facility] 10/31/2016, 10:00 PM

## 2016-10-31 NOTE — Tx Team (Signed)
Interdisciplinary Treatment and Diagnostic Plan Update  10/31/2016 Time of Session: 8:42 AM  Fremont Skalicky MRN: [id number]  Principal Diagnosis: Paranoid schizophrenia, chronic condition with acute exacerbation ([place])  Secondary Diagnoses: Principal Problem:   Paranoid schizophrenia, chronic condition with acute exacerbation ([place]) Active Problems:   Diabetes mellitus ([place])   Essential hypertension   Current Medications:  Current Facility-Administered Medications  Medication Dose Route Frequency Provider Last Rate Last Dose  . acetaminophen (TYLENOL) tablet 650 mg  650 mg Oral Q6H PRN Laverle Hobby, PA-C      . albuterol (PROVENTIL HFA;VENTOLIN HFA) 108 (90 Base) MCG/ACT inhaler 2 puff  2 puff Inhalation Q4H PRN Laverle Hobby, PA-C      . amLODipine (NORVASC) tablet 5 mg  5 mg Oral Daily Laverle Hobby, PA-C   5 mg at 10/31/16 0735  . atorvastatin (LIPITOR) tablet 20 mg  20 mg Oral Daily Laverle Hobby, PA-C   20 mg at 10/31/16 0735  . feeding supplement (ENSURE ENLIVE) (ENSURE ENLIVE) liquid 237 mL  237 mL Oral BID BM Saramma Eappen, MD   237 mL at 10/30/16 1425  . gabapentin (NEURONTIN) capsule 300 mg  300 mg Oral TID Laverle Hobby, PA-C   300 mg at 10/31/16 9924  . hydrochlorothiazide (HYDRODIURIL) tablet 25 mg  25 mg Oral Daily Laverle Hobby, PA-C   25 mg at 10/31/16 0734  . hydrOXYzine (ATARAX/VISTARIL) tablet 25 mg  25 mg Oral Q6H PRN Laverle Hobby, PA-C      . insulin aspart (novoLOG) injection 0-5 Units  0-5 Units Subcutaneous QHS Saramma Eappen, MD      . insulin aspart (novoLOG) injection 0-9 Units  0-9 Units Subcutaneous TID WC Ursula Alert, MD   2 Units at 10/31/16 0626  . linagliptin (TRADJENTA) tablet 5 mg  5 mg Oral Daily Laverle Hobby, PA-C   5 mg at 10/31/16 0735  . magnesium hydroxide (MILK OF MAGNESIA) suspension 30 mL  30 mL Oral Daily PRN Laverle Hobby, PA-C      . metFORMIN (GLUCOPHAGE) tablet 1,000 mg  1,000 mg Oral BID WC Laverle Hobby, PA-C    1,000 mg at 10/31/16 0734  . nicotine polacrilex (NICORETTE) gum 2 mg  2 mg Oral PRN Laverle Hobby, PA-C   2 mg at 10/31/16 0748  . pantoprazole (PROTONIX) EC tablet 40 mg  40 mg Oral Daily Laverle Hobby, PA-C   40 mg at 10/31/16 0735  . QUEtiapine (SEROQUEL) tablet 25 mg  25 mg Oral TID PRN Ursula Alert, MD      . traZODone (DESYREL) tablet 100 mg  100 mg Oral QHS Ursula Alert, MD   100 mg at 10/30/16 2134  . trihexyphenidyl (ARTANE) tablet 2 mg  2 mg Oral QHS Saramma Eappen, MD      . ziprasidone (GEODON) capsule 40 mg  40 mg Oral BID WC Ursula Alert, MD   40 mg at 10/31/16 0734    PTA Medications: Prescriptions Prior to Admission  Medication Sig Dispense Refill Last Dose  . albuterol (PROVENTIL HFA;VENTOLIN HFA) 108 (90 Base) MCG/ACT inhaler Inhale 2 puffs into the lungs every 4 (four) hours as needed for wheezing or shortness of breath (cough). 1 Inhaler 0   . amLODipine (NORVASC) 5 MG tablet Take 5 mg by mouth daily.     05/12/2016 at Unknown time  . atorvastatin (LIPITOR) 20 MG tablet Take 20 mg by mouth daily. Reported on 05/01/2016   05/12/2016 at  Unknown time  . gabapentin (NEURONTIN) 300 MG capsule Take 300 mg by mouth 3 (three) times daily.    05/12/2016 at Unknown time  . hydrochlorothiazide (HYDRODIURIL) 25 MG tablet Take 25 mg by mouth daily.   05/12/2016 at Unknown time  . indomethacin (INDOCIN) 25 MG capsule Take 25 mg by mouth 2 (two) times daily with a meal.   05/12/2016 at Unknown time  . linagliptin (TRADJENTA) 5 MG TABS tablet Take 5 mg by mouth daily.   05/12/2016 at Unknown time  . metFORMIN (GLUCOPHAGE) 1000 MG tablet Take 1,000 mg by mouth 2 (two) times daily with a meal.     05/12/2016 at Unknown time  . pantoprazole (PROTONIX) 40 MG tablet Take 40 mg by mouth daily.   05/12/2016 at Unknown time  . risperidone (RISPERDAL) 4 MG tablet Take 4 mg by mouth daily.    05/12/2016 at Unknown time  . trihexyphenidyl (ARTANE) 2 MG tablet Take 2 mg by mouth daily.   05/12/2016 at  Unknown time    Treatment Modalities: Medication Management, Group therapy, Case management,  1 to 1 session with clinician, Psychoeducation, Recreational therapy.   Physician Treatment Plan for Primary Diagnosis: Paranoid schizophrenia, chronic condition with acute exacerbation ([place]) Long Term Goal(s): Improvement in symptoms so as ready for discharge  Short Term Goals: Ability to identify and develop effective coping behaviors will improve  Medication Management: Evaluate patient's response, side effects, and tolerance of medication regimen.  Therapeutic Interventions: 1 to 1 sessions, Unit Group sessions and Medication administration.  Evaluation of Outcomes: Progressing  Physician Treatment Plan for Secondary Diagnosis: Principal Problem:   Paranoid schizophrenia, chronic condition with acute exacerbation ([place]) Active Problems:   Diabetes mellitus ([place])   Essential hypertension   Long Term Goal(s): Improvement in symptoms so as ready for discharge  Short Term Goals: Compliance with prescribed medications will improve  Medication Management: Evaluate patient's response, side effects, and tolerance of medication regimen.  Therapeutic Interventions: 1 to 1 sessions, Unit Group sessions and Medication administration.  Evaluation of Outcomes: Progressing   RN Treatment Plan for Primary Diagnosis: Paranoid schizophrenia, chronic condition with acute exacerbation ([place]) Long Term Goal(s): Knowledge of disease and therapeutic regimen to maintain health will improve  Short Term Goals: Ability to verbalize feelings will improve and Compliance with prescribed medications will improve  Medication Management: RN will administer medications as ordered by provider, will assess and evaluate patient's response and provide education to patient for prescribed medication. RN will report any adverse and/or side effects to prescribing provider.  Therapeutic Interventions: 1 on 1 counseling  sessions, Psychoeducation, Medication administration, Evaluate responses to treatment, Monitor vital signs and CBGs as ordered, Perform/monitor CIWA, COWS, AIMS and Fall Risk screenings as ordered, Perform wound care treatments as ordered.  Evaluation of Outcomes: Progressing   LCSW Treatment Plan for Primary Diagnosis: Paranoid schizophrenia, chronic condition with acute exacerbation ([place]) Long Term Goal(s): Safe transition to appropriate next level of care at discharge, Engage patient in therapeutic group addressing interpersonal concerns.  Short Term Goals: Engage patient in aftercare planning with referrals and resources  Therapeutic Interventions: Assess for all discharge needs, 1 to 1 time with Social worker, Explore available resources and support systems, Assess for adequacy in community support network, Educate family and significant other(s) on suicide prevention, Complete Psychosocial Assessment, Interpersonal group therapy.  Evaluation of Outcomes: Met   Progress in Treatment: Attending groups: Yes Participating in groups: Yes Taking medication as prescribed: Yes Toleration medication: Yes, no side effects reported  at this time Family/Significant other contact made: Yes  ACT team Patient understands diagnosis: No Limited insight Discussing patient identified problems/goals with staff: Yes Medical problems stabilized or resolved: Yes Denies suicidal/homicidal ideation: Yes Issues/concerns per patient self-inventory: None Other: N/A  New problem(s) identified: None identified at this time.   New Short Term/Long Term Goal(s): None identified at this time.   Discharge Plan or Barriers: return to shelter, unless ACT team finds housing in the meantime, follow up with [facility]  Reason for Continuation of Hospitalization: Paranoia Disorganization Homicidal ideation [place] Medication stabilization   Estimated Length of Stay: 3-5 days  Attendees: Patient:  10/31/2016  8:42 AM  Physician: Ursula Alert, MD 10/31/2016  8:42 AM  Nursing: Jeanie Cooks, RN 10/31/2016  8:42 AM  RN Care Manager: Lars Pinks, RN 10/31/2016  8:42 AM  Social Worker: Ripley Fraise 10/31/2016  8:42 AM  Recreational Therapist: Marjette  10/31/2016  8:42 AM  Other: Norberto Sorenson 10/31/2016  8:42 AM  Other:  10/31/2016  8:42 AM    Scribe for Treatment Team:  Roque Lias LCSW 10/31/2016 8:42 AM

## 2016-10-31 NOTE — Progress Notes (Signed)
[facility] MD Progress Note  10/31/2016 2:41 PM [name]  MRN:  [id number] Subjective:  Patient states " I am ok."  Objective: Dylan Thorntonis a 55 y.o.AA male, who has a hx of schizophrenia , noncompliance with medications , is single , on SSD ( per report) , has a payee, reports he is currently homeless after losing his apartment , presented to [facility] ED for aggressive , bizarre behavior.   Patient seen and chart reviewed.Discussed patient with treatment team.  Pt today continues to be disorganized, although improving. Pt is less paranoid , his thought process is not as tangential as it were yesterday. Per staff he is tolerating medications well, denies ADRs. Encourage and support provided.    Principal Problem: Paranoid schizophrenia, chronic condition with acute exacerbation ([place]) Diagnosis:   Patient Active Problem List   Diagnosis Date Noted  . Diabetes mellitus ([place]) [E11.9] 10/30/2016  . Essential hypertension [I10] 10/30/2016  . Paranoid schizophrenia, chronic condition with acute exacerbation ([place]) [F20.0] 10/29/2016   Total Time spent with patient: 25 minutes  Past Psychiatric History: Please see H&P.   Past Medical History:  Past Medical History:  Diagnosis Date  . Diabetes mellitus   . Diabetic neuropathy ([place])   . High cholesterol   . Hypertension   . Schizophrenia [facility])     Past Surgical History:  Procedure Laterality Date  . TONSILLECTOMY     Family History:  Family History  Problem Relation Age of Onset  . Hypertension Mother   . Cancer Father   . Alcoholism Other    Family Psychiatric  History: Please see H&P.  Social History: Please see H&P.  History  Alcohol Use No     History  Drug Use No    Social History   Social History  . Marital status: Single    Spouse name: N/A  . Number of children: N/A  . Years of education: N/A   Social History Main Topics  . Smoking status: Current Every Day Smoker    Packs/day:  0.50    Types: Cigarettes  . Smokeless tobacco: Never Used  . Alcohol use No  . Drug use: No  . Sexual activity: Yes   Other Topics Concern  . None   Social History Narrative  . None   Additional Social History:    Pain Medications: Denies Prescriptions: See MAR Over the Counter: Denies History of alcohol / drug use?: No history of alcohol / drug abuse                    Sleep: Fair  Appetite:  Fair  Current Medications: Current Facility-Administered Medications  Medication Dose Route Frequency Provider Last Rate Last Dose  . acetaminophen (TYLENOL) tablet 650 mg  650 mg Oral Q6H PRN Laverle Hobby, PA-C      . albuterol (PROVENTIL HFA;VENTOLIN HFA) 108 (90 Base) MCG/ACT inhaler 2 puff  2 puff Inhalation Q4H PRN Laverle Hobby, PA-C      . amLODipine (NORVASC) tablet 5 mg  5 mg Oral Daily Laverle Hobby, PA-C   5 mg at 10/31/16 0735  . atorvastatin (LIPITOR) tablet 20 mg  20 mg Oral Daily Laverle Hobby, PA-C   20 mg at 10/31/16 0735  . feeding supplement (ENSURE ENLIVE) (ENSURE ENLIVE) liquid 237 mL  237 mL Oral BID BM Aisling Emigh, MD   237 mL at 10/31/16 1429  . gabapentin (NEURONTIN) capsule 300 mg  300 mg Oral TID Laverle Hobby,  PA-C   300 mg at 10/31/16 1250  . hydrochlorothiazide (HYDRODIURIL) tablet 25 mg  25 mg Oral Daily Laverle Hobby, PA-C   25 mg at 10/31/16 0865  . hydrOXYzine (ATARAX/VISTARIL) tablet 25 mg  25 mg Oral Q6H PRN Laverle Hobby, PA-C      . insulin aspart (novoLOG) injection 0-5 Units  0-5 Units Subcutaneous QHS Danetta Prom, MD      . insulin aspart (novoLOG) injection 0-9 Units  0-9 Units Subcutaneous TID WC Ursula Alert, MD   2 Units at 10/31/16 1211  . linagliptin (TRADJENTA) tablet 5 mg  5 mg Oral Daily Laverle Hobby, PA-C   5 mg at 10/31/16 0735  . magnesium hydroxide (MILK OF MAGNESIA) suspension 30 mL  30 mL Oral Daily PRN Laverle Hobby, PA-C      . metFORMIN (GLUCOPHAGE) tablet 1,000 mg  1,000 mg Oral BID WC  Laverle Hobby, PA-C   1,000 mg at 10/31/16 0734  . nicotine polacrilex (NICORETTE) gum 2 mg  2 mg Oral PRN Laverle Hobby, PA-C   2 mg at 10/31/16 1432  . pantoprazole (PROTONIX) EC tablet 40 mg  40 mg Oral Daily Laverle Hobby, PA-C   40 mg at 10/31/16 0735  . QUEtiapine (SEROQUEL) tablet 25 mg  25 mg Oral TID PRN Ursula Alert, MD      . traZODone (DESYREL) tablet 100 mg  100 mg Oral QHS Ursula Alert, MD   100 mg at 10/30/16 2134  . trihexyphenidyl (ARTANE) tablet 2 mg  2 mg Oral QHS Jazia Faraci, MD      . ziprasidone (GEODON) capsule 40 mg  40 mg Oral BID WC Ursula Alert, MD   40 mg at 10/31/16 0734    Lab Results:  Results for orders placed or performed during the hospital encounter of 10/29/16 (from the past 48 hour(s))  CBC     Status: Abnormal   Collection Time: 10/30/16  6:23 AM  Result Value Ref Range   WBC 11.4 (H) 4.0 - 10.5 K/uL   RBC 5.57 4.22 - 5.81 MIL/uL   Hemoglobin 16.2 13.0 - 17.0 g/dL   HCT 47.6 39.0 - 52.0 %   MCV 85.5 78.0 - 100.0 fL   MCH 29.1 26.0 - 34.0 pg   MCHC 34.0 30.0 - 36.0 g/dL   RDW 13.8 11.5 - 15.5 %   Platelets 170 150 - 400 K/uL    Comment: Performed at [facility]  Comprehensive metabolic panel     Status: Abnormal   Collection Time: 10/30/16  6:23 AM  Result Value Ref Range   Sodium 136 135 - 145 mmol/L   Potassium 4.2 3.5 - 5.1 mmol/L   Chloride 99 (L) 101 - 111 mmol/L   CO2 26 22 - 32 mmol/L   Glucose, Bld 153 (H) 65 - 99 mg/dL   BUN 21 (H) 6 - 20 mg/dL   Creatinine, Ser 1.08 0.61 - 1.24 mg/dL   Calcium 9.8 8.9 - 10.3 mg/dL   Total Protein 7.5 6.5 - 8.1 g/dL   Albumin 4.4 3.5 - 5.0 g/dL   AST 18 15 - 41 U/L   ALT 19 17 - 63 U/L   Alkaline Phosphatase 64 38 - 126 U/L   Total Bilirubin 0.8 0.3 - 1.2 mg/dL   GFR calc non Af Amer >60 >60 mL/min   GFR calc Af Amer >60 >60 mL/min    Comment: (NOTE) The eGFR has been calculated using  the CKD EPI equation. This calculation has not been validated in all  clinical situations. eGFR's persistently <60 mL/min signify possible Chronic Kidney Disease.    Anion gap 11 5 - 15    Comment: Performed at [facility]  Hemoglobin A1c     Status: Abnormal   Collection Time: 10/30/16  6:23 AM  Result Value Ref Range   Hgb A1c MFr Bld 9.4 (H) 4.8 - 5.6 %    Comment: (NOTE)         Pre-diabetes: 5.7 - 6.4         Diabetes: >6.4         Glycemic control for adults with diabetes: <7.0    Mean Plasma Glucose 223 mg/dL    Comment: (NOTE) Performed At: [facility] [place], [place] [id number] Lindon Romp MD [id number] Performed at [facility]   Lipid panel     Status: None   Collection Time: 10/30/16  6:23 AM  Result Value Ref Range   Cholesterol 149 0 - 200 mg/dL   Triglycerides 105 <150 mg/dL   HDL 54 >40 mg/dL   Total CHOL/HDL Ratio 2.8 RATIO   VLDL 21 0 - 40 mg/dL   LDL Cholesterol 74 0 - 99 mg/dL    Comment:        Total Cholesterol/HDL:CHD Risk Coronary Heart Disease Risk Table                     Men   Women  1/2 Average Risk   3.4   3.3  Average Risk       5.0   4.4  2 X Average Risk   9.6   7.1  3 X Average Risk  23.4   11.0        Use the calculated Patient Ratio above and the CHD Risk Table to determine the patient's CHD Risk.        ATP III CLASSIFICATION (LDL):  <100     mg/dL   Optimal  100-129  mg/dL   Near or Above                    Optimal  130-159  mg/dL   Borderline  160-189  mg/dL   High  >190     mg/dL   Very High Performed at [facility]   TSH     Status: None   Collection Time: 10/30/16  6:23 AM  Result Value Ref Range   TSH 1.467 0.350 - 4.500 uIU/mL    Comment: Performed by a 3rd Generation assay with a functional sensitivity of <=0.01 uIU/mL. Performed at [facility]   Prolactin     Status: Abnormal   Collection Time: 10/30/16  6:23 AM  Result Value Ref Range   Prolactin 64.6 (H) 4.0 - 15.2 ng/mL     Comment: (NOTE) Performed At: [facility] [place], [place] [id number] Lindon Romp MD [id number] Performed at [facility]   Hepatic function panel     Status: Abnormal   Collection Time: 10/30/16  6:23 AM  Result Value Ref Range   Total Protein 7.8 6.5 - 8.1 g/dL   Albumin 4.4 3.5 - 5.0 g/dL   AST 20 15 - 41 U/L   ALT 18 17 - 63 U/L   Alkaline Phosphatase 62 38 - 126 U/L   Total Bilirubin 0.9 0.3 - 1.2 mg/dL  Bilirubin, Direct <0.1 (L) 0.1 - 0.5 mg/dL   Indirect Bilirubin NOT CALCULATED 0.3 - 0.9 mg/dL    Comment: Performed at [facility]  Glucose, capillary     Status: Abnormal   Collection Time: 10/30/16  6:43 AM  Result Value Ref Range   Glucose-Capillary 158 (H) 65 - 99 mg/dL  Urinalysis, Routine w reflex microscopic (not at [facility])     Status: Abnormal   Collection Time: 10/30/16 12:02 PM  Result Value Ref Range   Color, Urine YELLOW YELLOW   APPearance CLEAR CLEAR   Specific Gravity, Urine 1.008 1.005 - 1.030   pH 5.5 5.0 - 8.0   Glucose, UA 500 (A) NEGATIVE mg/dL   Hgb urine dipstick NEGATIVE NEGATIVE   Bilirubin Urine NEGATIVE NEGATIVE   Ketones, ur NEGATIVE NEGATIVE mg/dL   Protein, ur NEGATIVE NEGATIVE mg/dL   Nitrite NEGATIVE NEGATIVE   Leukocytes, UA NEGATIVE NEGATIVE    Comment: MICROSCOPIC NOT DONE ON URINES WITH NEGATIVE PROTEIN, BLOOD, LEUKOCYTES, NITRITE, OR GLUCOSE <1000 mg/dL. Performed at [facility]   Glucose, capillary     Status: Abnormal   Collection Time: 10/30/16 12:02 PM  Result Value Ref Range   Glucose-Capillary 108 (H) 65 - 99 mg/dL  Glucose, capillary     Status: Abnormal   Collection Time: 10/30/16  5:07 PM  Result Value Ref Range   Glucose-Capillary 163 (H) 65 - 99 mg/dL   Comment 1 Notify RN    Comment 2 Document in Chart   Glucose, capillary     Status: Abnormal   Collection Time: 10/30/16  9:33 PM  Result Value Ref Range   Glucose-Capillary 177  (H) 65 - 99 mg/dL  Glucose, capillary     Status: Abnormal   Collection Time: 10/31/16  6:05 AM  Result Value Ref Range   Glucose-Capillary 177 (H) 65 - 99 mg/dL  CBC with Differential/Platelet     Status: None   Collection Time: 10/31/16  6:13 AM  Result Value Ref Range   WBC 9.8 4.0 - 10.5 K/uL   RBC 5.21 4.22 - 5.81 MIL/uL   Hemoglobin 15.2 13.0 - 17.0 g/dL   HCT 44.8 39.0 - 52.0 %   MCV 86.0 78.0 - 100.0 fL   MCH 29.2 26.0 - 34.0 pg   MCHC 33.9 30.0 - 36.0 g/dL   RDW 14.0 11.5 - 15.5 %   Platelets 171 150 - 400 K/uL   Neutrophils Relative % 67 %   Neutro Abs 6.7 1.7 - 7.7 K/uL   Lymphocytes Relative 19 %   Lymphs Abs 1.8 0.7 - 4.0 K/uL   Monocytes Relative 9 %   Monocytes Absolute 0.9 0.1 - 1.0 K/uL   Eosinophils Relative 4 %   Eosinophils Absolute 0.4 0.0 - 0.7 K/uL   Basophils Relative 1 %   Basophils Absolute 0.1 0.0 - 0.1 K/uL    Comment: Performed at [facility]  TSH     Status: None   Collection Time: 10/31/16  6:13 AM  Result Value Ref Range   TSH 0.992 0.350 - 4.500 uIU/mL    Comment: Performed by a 3rd Generation assay with a functional sensitivity of <=0.01 uIU/mL. Performed at [facility]   Glucose, capillary     Status: Abnormal   Collection Time: 10/31/16 12:07 PM  Result Value Ref Range   Glucose-Capillary 153 (H) 65 - 99 mg/dL    Blood Alcohol level:  Lab Results  Component Value  Date   ETH <5 05/01/2016   ETH  11/09/2010    <5        LOWEST DETECTABLE LIMIT FOR SERUM ALCOHOL IS 5 mg/dL FOR MEDICAL PURPOSES ONLY    Metabolic Disorder Labs: Lab Results  Component Value Date   HGBA1C 9.4 (H) 10/30/2016   MPG 223 10/30/2016   MPG 235 10/25/2009   Lab Results  Component Value Date   PROLACTIN 64.6 (H) 10/30/2016   Lab Results  Component Value Date   CHOL 149 10/30/2016   TRIG 105 10/30/2016   HDL 54 10/30/2016   CHOLHDL 2.8 10/30/2016   VLDL 21 10/30/2016   LDLCALC 74 10/30/2016   LDLCALC   10/25/2009    52        Total Cholesterol/HDL:CHD Risk Coronary Heart Disease Risk Table                     Men   Women  1/2 Average Risk   3.4   3.3  Average Risk       5.0   4.4  2 X Average Risk   9.6   7.1  3 X Average Risk  23.4   11.0        Use the calculated Patient Ratio above and the CHD Risk Table to determine the patient's CHD Risk.        ATP III CLASSIFICATION (LDL):  <100     mg/dL   Optimal  100-129  mg/dL   Near or Above                    Optimal  130-159  mg/dL   Borderline  160-189  mg/dL   High  >190     mg/dL   Very High    Physical Findings: AIMS: Facial and Oral Movements Muscles of Facial Expression: None, normal Lips and Perioral Area: None, normal Jaw: None, normal Tongue: None, normal,Extremity Movements Upper (arms, wrists, hands, fingers): None, normal Lower (legs, knees, ankles, toes): None, normal, Trunk Movements Neck, shoulders, hips: None, normal, Overall Severity Severity of abnormal movements (highest score from questions above): None, normal Incapacitation due to abnormal movements: None, normal Patient's awareness of abnormal movements (rate only patient's report): No Awareness, Dental Status Current problems with teeth and/or dentures?: No Does patient usually wear dentures?: No  CIWA:    COWS:     Musculoskeletal: Strength & Muscle Tone: within normal limits Gait & Station: normal Patient leans: N/A  Psychiatric Specialty Exam: Physical Exam  Nursing note and vitals reviewed.   Review of Systems  Psychiatric/Behavioral: The patient is nervous/anxious.   All other systems reviewed and are negative.   Blood pressure 125/84, pulse 90, temperature 97.9 F (36.6 C), temperature source Oral, resp. rate 18, height 5' 11.5" (1.816 m), weight 76.2 kg (168 lb), SpO2 97 %.Body mass index is 23.1 kg/m.  General Appearance: Fairly Groomed  Eye Contact:  Fair  Speech:  Pressured  Volume:  Decreased  Mood:  Anxious and Dysphoric   Affect:  Congruent  Thought Process:  Disorganized, Irrelevant and Descriptions of Associations: Tangential  Orientation:  Full (Time, Place, and Person)  Thought Content:  Delusions, Paranoid Ideation and Rumination  Suicidal Thoughts:  No  Homicidal Thoughts:  No  Memory:  Immediate;   Fair Recent;   Fair Remote;   Poor  Judgement:  Impaired  Insight:  Shallow  Psychomotor Activity:  Normal  Concentration:  Concentration: Fair and Attention Span: Fair  Recall:  Smiley Houseman of Knowledge:  Fair  Language:  Fair  Akathisia:  No  Handed:  Right  AIMS (if indicated):     Assets:  Desire for Improvement  ADL's:  Intact  Cognition:  WNL  Sleep:  Number of Hours: 5.25     Treatment Plan Summary: Patient continues to be disorganized , requiring redirection on the unit - continue to readjust medications.  Will continue today 10/31/16 plan as below except where it is noted.  Daily contact with patient to assess and evaluate symptoms and progress in treatment and Medication management Will continue Geodon 40 mg po bid with meals for psychosis. Will continue Trazodone 100 mg po qhs for sleep. Restarted home medications where indicated. DM management as per unit protocol. Reviewed past medical records,treatment plan.  Will continue to monitor vitals ,medication compliance and treatment side effects while patient is here.  Will monitor for medical issues as well as call consult as needed.  Reviewed labs - CMP - wnl, UDS- negative, lipid panel - wnl, will repeat UA- pos for rbc pending , reviewed  TSH- wnl , pending hba1c, pl . I have reviewed EKG - qtc - wnl. CSW will continue working on disposition.  Patient to participate in therapeutic milieu  Amar Sippel, MD 10/31/2016, 2:41 PM

## 2016-10-31 NOTE — [facility] Group Notes (Addendum)
[facility] LCSW Group Therapy  10/31/2016 2:47 PM   Type of Therapy:  Group Therapy   Participation Level:  Engaged  Participation Quality:  Attentive  Affect:  Appropriate   Cognitive:  Alert   Insight:  Engaged  Engagement in Therapy:  Improving   Modes of Intervention:  Education, Exploration, Socialization   Summary of Progress/Problems: [name] was engaged throughout. He was constantly in and out of group session.   [name] from [facility] [employer] was here to tell his story of recovery, inform patients about [facility] and play his guitar.   [name] 10/31/2016 2:47 PM
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
## 2016-10-31 NOTE — Progress Notes (Signed)
D: [name] is a bit disorganized and tangential, but he's been very pleasant, even giddy at times. He's been polite and cooperative, denying SI/HI/AVH. He takes all medications and is engaged in groups.  A: Meds given as ordered. Q15 safety checks maintained. Support/encouragement offered. R: Pt remains free from harm and continues with treatment. Will continue to monitor for needs/safety.

## 2016-11-01 LAB — HEMOGLOBIN A1C
Hgb A1c MFr Bld: 9.4 % — ABNORMAL HIGH (ref 4.8–5.6)
MEAN PLASMA GLUCOSE: 223 mg/dL

## 2016-11-01 LAB — GLUCOSE, CAPILLARY
GLUCOSE-CAPILLARY: 138 mg/dL — AB (ref 65–99)
GLUCOSE-CAPILLARY: 192 mg/dL — AB (ref 65–99)
Glucose-Capillary: 139 mg/dL — ABNORMAL HIGH (ref 65–99)
Glucose-Capillary: 194 mg/dL — ABNORMAL HIGH (ref 65–99)

## 2016-11-01 LAB — PROLACTIN: Prolactin: 43.1 ng/mL — ABNORMAL HIGH (ref 4.0–15.2)

## 2016-11-01 MED ORDER — GABAPENTIN 400 MG PO CAPS
400.0000 mg | ORAL_CAPSULE | Freq: Three times a day (TID) | ORAL | Status: DC
  Administered 2016-11-01 – 2016-11-02 (×3): 400 mg via ORAL
  Filled 2016-11-01 (×5): qty 1

## 2016-11-01 MED ORDER — ZIPRASIDONE HCL 60 MG PO CAPS
60.0000 mg | ORAL_CAPSULE | Freq: Two times a day (BID) | ORAL | Status: DC
Start: 2016-11-01 — End: 2016-11-02
  Administered 2016-11-01 – 2016-11-02 (×2): 60 mg via ORAL
  Filled 2016-11-01 (×4): qty 1

## 2016-11-01 NOTE — [facility] Group Notes (Signed)
[facility] Group Notes:  (Counselor/Nursing/MHT/Case Management/Adjunct)  11/01/2016 1:15PM  Type of Therapy:  Group Therapy  Participation Level:  Active  Participation Quality:  Appropriate  Affect:  Flat  Cognitive:  Oriented  Insight:  Improving  Engagement in Group:  Limited  Engagement in Therapy:  Limited  Modes of Intervention:  Discussion, Exploration and Socialization  Summary of Progress/Problems: The topic for group was balance in life.  Pt participated in the discussion about when their life was in balance and out of balance and how this feels.  Pt discussed ways to get back in balance and short term goals they can work on to get where they want to be. Unable to say whether he is balanced or not, but had plenty to say in a tangential manner-that involved talking about relationships with women, his grandmother, religion, jail, gun control,  and people who call each other names.  Entertaining, but difficult to redirect.   [name], [name] B 11/01/2016 1:13 PM

## 2016-11-01 NOTE — Progress Notes (Signed)
NUTRITION NOTE  Consult for diet education related to elevated HgbA1c received. MD note from a few minutes ago states that pt has been hyperactive, disruptive, intrusive, and often requires redirection. Education not appropriate at this time. Please re-consult when pt demeanor more conducive to education.  HgbA1c: 9.4%  Trenton GammonJessica Anneth Brunell, MS, RD, LDN Inpatient Clinical Dietitian Pager # [phone] After hours/weekend pager # [phone]'

## 2016-11-01 NOTE — Progress Notes (Signed)
Recreation Therapy Notes  Date: 11/01/16 Time: 1000 Location: 500 Hall Dayroom  Group Topic: Coping Skills  Goal Area(s) Addresses:  Pt will be able to identify positive coping skills. Pt will be able to identify the importance of using coping skills. Pt will be able to identify what changes for them if they use coping skills post d/c.  Behavioral Response: Engaged  Intervention: Magazines, worksheets, glue sticks, scissors  Activity: Engineer, siteCoping Skills worksheet.  Patients were to identify coping skills that can be used for diversions, socially, cognitively, tension releasers and physically.  Pt were to identify at least two coping skills for each category.    Education: PharmacologistCoping Skills, Building control surveyorDischarge Planning.   Education Outcome: Acknowledges understanding/In group clarification offered/Needs additional education.   Clinical Observations/Feedback: Pt was active during group and on task.  Pt identified money and women as diversions; dancing and basketball as social; reading and staying clean from drugs as cognitive; taking medications as tension releasers; and taking a shower/bath as physical.  Pt stated "thinking positive and not negative" would help him moving forward.    Caroll RancherMarjette Konstantine Gervasi, LRT/CTRS      Caroll RancherLindsay, Krystan Northrop A 11/01/2016 12:11 PM
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
## 2016-11-01 NOTE — Progress Notes (Signed)
DAR NOTE: Patient presents as hyperactive and tangential in speech.  Patient is disruptive in milieu but redirectabe.  Patient observed responding to internal stimuli.  Argumentative with peers on the unit and making racial comment towards caucasian staff. Denies pain, auditory and visual hallucinations.  Rates depression at 0, hopelessness at 0, and anxiety at 0.  Described energy level as normal and concentration as good.  Maintained on routine safety checks.  Medications given as prescribed.  Support and encouragement offered as needed.  Attended group and participated.   Patient observed socializing with peers at times in the dayroom.  Offered no complaint.

## 2016-11-01 NOTE — Progress Notes (Signed)
[employer] MD Progress Note  11/01/2016 2:02 PM Lawyer Washabaugh  MRN:  [id number] Subjective:  Patient states " I am doing fine."  Objective: Jusitn Thorntonis a 55 y.o.AA male, who has a hx of schizophrenia , noncompliance with medications , is single , on SSD ( per report) , has a payee, reports he is currently homeless after losing his apartment , presented to [facility] ED for aggressive , bizarre behavior.   Patient seen and chart reviewed.Discussed patient with treatment team.  Pt today seen as labile , hyperactive and tangential at times. Pt per staff - has been disruptive and intrusive on the unit often requiring redirection. Per staff he is tolerating medications well, denies ADRs. Will continue to provide encouragement and support .    Principal Problem: Paranoid schizophrenia, chronic condition with acute exacerbation (HCC) Diagnosis:   Patient Active Problem List   Diagnosis Date Noted  . Diabetes mellitus (HCC) [E11.9] 10/30/2016  . Essential hypertension [I10] 10/30/2016  . Paranoid schizophrenia, chronic condition with acute exacerbation (HCC) [F20.0] 10/29/2016   Total Time spent with patient: 25 minutes  Past Psychiatric History: Please see H&P.   Past Medical History:  Past Medical History:  Diagnosis Date  . Diabetes mellitus   . Diabetic neuropathy (HCC)   . High cholesterol   . Hypertension   . Schizophrenia [facility])     Past Surgical History:  Procedure Laterality Date  . TONSILLECTOMY     Family History:  Family History  Problem Relation Age of Onset  . Hypertension Mother   . Cancer Father   . Alcoholism Other    Family Psychiatric  History: Please see H&P.  Social History: Please see H&P.  History  Alcohol Use No     History  Drug Use No    Social History   Social History  . Marital status: Single    Spouse name: N/A  . Number of children: N/A  . Years of education: N/A   Social History Main Topics  . Smoking status:  Current Every Day Smoker    Packs/day: 0.50    Types: Cigarettes  . Smokeless tobacco: Never Used  . Alcohol use No  . Drug use: No  . Sexual activity: Yes   Other Topics Concern  . None   Social History Narrative  . None   Additional Social History:    Pain Medications: Denies Prescriptions: See MAR Over the Counter: Denies History of alcohol / drug use?: No history of alcohol / drug abuse                    Sleep: Fair  Appetite:  Fair  Current Medications: Current Facility-Administered Medications  Medication Dose Route Frequency Provider Last Rate Last Dose  . acetaminophen (TYLENOL) tablet 650 mg  650 mg Oral Q6H PRN Kerry Hough, PA-C   650 mg at 11/01/16 0422  . albuterol (PROVENTIL HFA;VENTOLIN HFA) 108 (90 Base) MCG/ACT inhaler 2 puff  2 puff Inhalation Q4H PRN Kerry Hough, PA-C      . amLODipine (NORVASC) tablet 5 mg  5 mg Oral Daily Kerry Hough, PA-C   5 mg at 11/01/16 0815  . atorvastatin (LIPITOR) tablet 20 mg  20 mg Oral Daily Kerry Hough, PA-C   20 mg at 11/01/16 0816  . feeding supplement (ENSURE ENLIVE) (ENSURE ENLIVE) liquid 237 mL  237 mL Oral BID BM Namya Voges, MD   237 mL at 11/01/16 1007  . gabapentin (NEURONTIN) capsule  400 mg  400 mg Oral TID Jomarie Longs, MD      . hydrochlorothiazide (HYDRODIURIL) tablet 25 mg  25 mg Oral Daily Kerry Hough, PA-C   25 mg at 11/01/16 0815  . hydrOXYzine (ATARAX/VISTARIL) tablet 25 mg  25 mg Oral Q6H PRN Kerry Hough, PA-C      . insulin aspart (novoLOG) injection 0-5 Units  0-5 Units Subcutaneous QHS Lexianna Weinrich, MD      . insulin aspart (novoLOG) injection 0-9 Units  0-9 Units Subcutaneous TID WC Jomarie Longs, MD   1 Units at 11/01/16 1151  . linagliptin (TRADJENTA) tablet 5 mg  5 mg Oral Daily Kerry Hough, PA-C   5 mg at 11/01/16 0815  . magnesium hydroxide (MILK OF MAGNESIA) suspension 30 mL  30 mL Oral Daily PRN Kerry Hough, PA-C      . metFORMIN (GLUCOPHAGE) tablet  1,000 mg  1,000 mg Oral BID WC Kerry Hough, PA-C   1,000 mg at 11/01/16 0815  . nicotine polacrilex (NICORETTE) gum 2 mg  2 mg Oral PRN Kerry Hough, PA-C   2 mg at 11/01/16 0819  . pantoprazole (PROTONIX) EC tablet 40 mg  40 mg Oral Daily Kerry Hough, PA-C   40 mg at 11/01/16 0815  . QUEtiapine (SEROQUEL) tablet 25 mg  25 mg Oral TID PRN Jomarie Longs, MD   25 mg at 10/31/16 1943  . traZODone (DESYREL) tablet 100 mg  100 mg Oral QHS Jomarie Longs, MD   100 mg at 10/31/16 2102  . trihexyphenidyl (ARTANE) tablet 2 mg  2 mg Oral QHS Jomarie Longs, MD   2 mg at 10/31/16 2102  . ziprasidone (GEODON) capsule 60 mg  60 mg Oral BID WC Jomarie Longs, MD        Lab Results:  Results for orders placed or performed during the hospital encounter of 10/29/16 (from the past 48 hour(s))  Glucose, capillary     Status: Abnormal   Collection Time: 10/30/16  5:07 PM  Result Value Ref Range   Glucose-Capillary 163 (H) 65 - 99 mg/dL   Comment 1 Notify RN    Comment 2 Document in Chart   Glucose, capillary     Status: Abnormal   Collection Time: 10/30/16  9:33 PM  Result Value Ref Range   Glucose-Capillary 177 (H) 65 - 99 mg/dL  Glucose, capillary     Status: Abnormal   Collection Time: 10/31/16  6:05 AM  Result Value Ref Range   Glucose-Capillary 177 (H) 65 - 99 mg/dL  CBC with Differential/Platelet     Status: None   Collection Time: 10/31/16  6:13 AM  Result Value Ref Range   WBC 9.8 4.0 - 10.5 K/uL   RBC 5.21 4.22 - 5.81 MIL/uL   Hemoglobin 15.2 13.0 - 17.0 g/dL   HCT 16.1 09.6 - 04.5 %   MCV 86.0 78.0 - 100.0 fL   MCH 29.2 26.0 - 34.0 pg   MCHC 33.9 30.0 - 36.0 g/dL   RDW 40.9 81.1 - 91.4 %   Platelets 171 150 - 400 K/uL   Neutrophils Relative % 67 %   Neutro Abs 6.7 1.7 - 7.7 K/uL   Lymphocytes Relative 19 %   Lymphs Abs 1.8 0.7 - 4.0 K/uL   Monocytes Relative 9 %   Monocytes Absolute 0.9 0.1 - 1.0 K/uL   Eosinophils Relative 4 %   Eosinophils Absolute 0.4 0.0 - 0.7 K/uL    Basophils  Relative 1 %   Basophils Absolute 0.1 0.0 - 0.1 K/uL    Comment: Performed at [facility]  Hemoglobin A1c     Status: Abnormal   Collection Time: 10/31/16  6:13 AM  Result Value Ref Range   Hgb A1c MFr Bld 9.4 (H) 4.8 - 5.6 %    Comment: (NOTE)         Pre-diabetes: 5.7 - 6.4         Diabetes: >6.4         Glycemic control for adults with diabetes: <7.0    Mean Plasma Glucose 223 mg/dL    Comment: (NOTE) Performed At: [facility] [address] [place], [place] [id number] Mila HomerHancock William F MD [phone] Performed at [facility]   Prolactin     Status: Abnormal   Collection Time: 10/31/16  6:13 AM  Result Value Ref Range   Prolactin 43.1 (H) 4.0 - 15.2 ng/mL    Comment: (NOTE) Performed At: [facility]  [address] [place], [place] [id number] Mila HomerHancock William F MD VH:[phone] Performed at [facility]   TSH     Status: None   Collection Time: 10/31/16  6:13 AM  Result Value Ref Range   TSH 0.992 0.350 - 4.500 uIU/mL    Comment: Performed by a 3rd Generation assay with a functional sensitivity of <=0.01 uIU/mL. Performed at [facility]   Glucose, capillary     Status: Abnormal   Collection Time: 10/31/16 12:07 PM  Result Value Ref Range   Glucose-Capillary 153 (H) 65 - 99 mg/dL  Glucose, capillary     Status: Abnormal   Collection Time: 10/31/16  5:09 PM  Result Value Ref Range   Glucose-Capillary 181 (H) 65 - 99 mg/dL  Glucose, capillary     Status: Abnormal   Collection Time: 10/31/16  8:32 PM  Result Value Ref Range   Glucose-Capillary 160 (H) 65 - 99 mg/dL  Glucose, capillary     Status: Abnormal   Collection Time: 11/01/16  6:03 AM  Result Value Ref Range   Glucose-Capillary 139 (H) 65 - 99 mg/dL  Glucose, capillary     Status: Abnormal   Collection Time: 11/01/16 11:26 AM  Result Value Ref Range   Glucose-Capillary 138 (H) 65 - 99 mg/dL   Comment 1  Notify RN    Comment 2 Document in Chart     Blood Alcohol level:  Lab Results  Component Value Date   ETH <5 05/01/2016   ETH  11/09/2010    <5        LOWEST DETECTABLE LIMIT FOR SERUM ALCOHOL IS 5 mg/dL FOR MEDICAL PURPOSES ONLY    Metabolic Disorder Labs: Lab Results  Component Value Date   HGBA1C 9.4 (H) 10/31/2016   MPG 223 10/31/2016   MPG 223 10/30/2016   Lab Results  Component Value Date   PROLACTIN 43.1 (H) 10/31/2016   PROLACTIN 64.6 (H) 10/30/2016   Lab Results  Component Value Date   CHOL 149 10/30/2016   TRIG 105 10/30/2016   HDL 54 10/30/2016   CHOLHDL 2.8 10/30/2016   VLDL 21 10/30/2016   LDLCALC 74 10/30/2016   LDLCALC  10/25/2009    52        Total Cholesterol/HDL:CHD Risk Coronary Heart Disease Risk Table                     Men   Women  1/2 Average Risk  3.4   3.3  Average Risk       5.0   4.4  2 X Average Risk   9.6   7.1  3 X Average Risk  23.4   11.0        Use the calculated Patient Ratio above and the CHD Risk Table to determine the patient's CHD Risk.        ATP III CLASSIFICATION (LDL):  <100     mg/dL   Optimal  [id number]  mg/dL   Near or Above                    Optimal  130-159  mg/dL   Borderline  [id number]  mg/dL   High  >811>190     mg/dL   Very High    Physical Findings: AIMS: Facial and Oral Movements Muscles of Facial Expression: None, normal Lips and Perioral Area: None, normal Jaw: None, normal Tongue: None, normal,Extremity Movements Upper (arms, wrists, hands, fingers): None, normal Lower (legs, knees, ankles, toes): None, normal, Trunk Movements Neck, shoulders, hips: None, normal, Overall Severity Severity of abnormal movements (highest score from questions above): None, normal Incapacitation due to abnormal movements: None, normal Patient's awareness of abnormal movements (rate only patient's report): No Awareness, Dental Status Current problems with teeth and/or dentures?: No Does patient usually wear  dentures?: No  CIWA:    COWS:     Musculoskeletal: Strength & Muscle Tone: within normal limits Gait & Station: normal Patient leans: N/A  Psychiatric Specialty Exam: Physical Exam  Nursing note and vitals reviewed.   Review of Systems  Psychiatric/Behavioral: The patient is nervous/anxious.   All other systems reviewed and are negative.   Blood pressure 109/72, pulse 92, temperature 98.9 F (37.2 C), resp. rate 18, height 5' 11.5" (1.816 m), weight 76.2 kg (168 lb), SpO2 97 %.Body mass index is 23.1 kg/m.  General Appearance: Fairly Groomed  Eye Contact:  Fair  Speech:  Pressured  Volume:  Normal  Mood:  Anxious  Affect:  Labile  Thought Process:  Disorganized and Descriptions of Associations: Tangential  Orientation:  Full (Time, Place, and Person)  Thought Content:  Delusions, Paranoid Ideation and Rumination  Suicidal Thoughts:  No  Homicidal Thoughts:  No  Memory:  Immediate;   Fair Recent;   Fair Remote;   Fair  Judgement:  Impaired  Insight:  Shallow  Psychomotor Activity:  Normal  Concentration:  Concentration: Fair and Attention Span: Fair  Recall:  F[employer]Fair  Fund of Knowledge:  Fair  Language:  Fair  Akathisia:  No  Handed:  Right  AIMS (if indicated):     Assets:  Desire for Improvement  ADL's:  Intact  Cognition:  WNL  Sleep:  Number of Hours: 5     Treatment Plan Summary: Patient continues to be intrusive and labile on the unit  , requiring redirection on the unit - continue to readjust medications.  Will continue today 11/01/16 plan as below except where it is noted.  Daily contact with patient to assess and evaluate symptoms and progress in treatment and Medication management Will increase Geodon to 60 mg po bid with meals for psychosis. Will continue Trazodone 100 mg po qhs for sleep. Restarted home medications where indicated. DM management as per unit protocol. Reviewed past medical records,treatment plan.  Will continue to monitor vitals  ,medication compliance and treatment side effects while patient is here.  Will monitor for medical issues as well as call consult as needed.  Reviewed labs - CMP - wnl, UDS- negative, lipid panel - wnl,  UA- negative  , reviewed  TSH- wnl , hba1c- 9.4 - elevated - dietician consult , pl- 43.1 - will need to be monitored on an out patient basis. I have reviewed EKG - qtc - wnl. CSW will continue working on disposition.  Patient to participate in therapeutic milieu  Florine Sprenkle, MD 11/01/2016, 2:02 PM

## 2016-11-01 NOTE — Progress Notes (Signed)
D: Pt was in the hallway singing praise and worship songs upon initial approach.  Pt presents with anxious affect and mood.  He states "it was a wonderful day today" and his goal is to "get better and get out of the hospital and get my money and smoke that nice cigarette."  Pt denies SI/HI, denies hallucinations, denies pain.  Pt has been visible in milieu interacting with peers and staff appropriately.  Pt attended evening group.   A: Introduced self to pt.  Actively listened to pt and offered support and encouragement. Medications administered per order. R: Pt is safe on the unit.  Pt is compliant with medications.  Pt verbally contracts for safety.  Will continue to monitor and assess.

## 2016-11-02 LAB — GLUCOSE, CAPILLARY
GLUCOSE-CAPILLARY: 108 mg/dL — AB (ref 65–99)
Glucose-Capillary: 167 mg/dL — ABNORMAL HIGH (ref 65–99)
Glucose-Capillary: 112 mg/dL — ABNORMAL HIGH (ref 65–99)
Glucose-Capillary: 101 mg/dL — ABNORMAL HIGH (ref 65–99)

## 2016-11-02 MED ORDER — OLANZAPINE 10 MG IM SOLR
10.0000 mg | Freq: Three times a day (TID) | INTRAMUSCULAR | Status: DC | PRN
  Filled 2016-11-02: qty 10

## 2016-11-02 MED ORDER — OLANZAPINE 10 MG PO TABS
10.0000 mg | ORAL_TABLET | Freq: Three times a day (TID) | ORAL | Status: DC | PRN
  Administered 2016-11-03 (×2): 10 mg via ORAL
  Filled 2016-11-02 (×2): qty 1

## 2016-11-02 MED ORDER — LORAZEPAM 2 MG/ML IJ SOLN: 1.0000 mg | Freq: Four times a day (QID) | INTRAMUSCULAR | Status: DC | PRN

## 2016-11-02 MED ORDER — ZIPRASIDONE HCL 80 MG PO CAPS
80.0000 mg | ORAL_CAPSULE | Freq: Two times a day (BID) | ORAL | Status: DC
  Administered 2016-11-02 – 2016-11-05 (×6): 80 mg via ORAL
  Filled 2016-11-02 (×8): qty 1

## 2016-11-02 MED ORDER — LORAZEPAM 1 MG PO TABS
1.0000 mg | ORAL_TABLET | Freq: Four times a day (QID) | ORAL | Status: DC | PRN
  Administered 2016-11-02 – 2016-11-04 (×4): 1 mg via ORAL
  Filled 2016-11-02 (×4): qty 1

## 2016-11-02 MED ORDER — GABAPENTIN 300 MG PO CAPS
600.0000 mg | ORAL_CAPSULE | Freq: Three times a day (TID) | ORAL | Status: DC
  Administered 2016-11-02 – 2016-11-05 (×9): 600 mg via ORAL
  Filled 2016-11-02 (×11): qty 2

## 2016-11-02 NOTE — Progress Notes (Signed)
Adult Psychoeducational Group Note  Date:  11/02/2016 Time:  10:05 PM  Group Topic/Focus:  Wrap-Up Group:   The focus of this group is to help patients review their daily goal of treatment and discuss progress on daily workbooks.   Participation Level:  Active  Participation Quality:  Intrusive  Affect:  Labile  Cognitive:  Disorganized  Insight: Lacking  Engagement in Group:  Engaged  Modes of Intervention:  Socialization and Support  Additional Comments:  Patient attended and participated in group tonight. He reports that today he meditate and eat good. He has been drinking coffee. He went to the gym and played basketball.  [name], [name] [facility] 11/02/2016, 10:05 PM

## 2016-11-02 NOTE — Progress Notes (Signed)
Recreation Therapy Notes  Date: 11/02/16 Time: 1000 Location: 500 Hall Dayroom  Group Topic: Communication, Team Building, Problem Solving  Goal Area(s) Addresses:  Patient will effectively work with peer towards shared goal.  Patient will identify skill used to make activity successful.  Patient will identify how skills used during activity can be used to reach post d/c goals.   Behavioral Response: Engaged  Intervention: STEM Activity   Activity: Glass blower/designeripe Cleaner Tower. In teams, patients were asked to build the tallest freestanding tower possible out of 15 pipe cleaners. Systematically resources were removed, for example patient ability to use both hands and patient ability to verbally communicate.    Education: Pharmacist, communityocial Skills, Building control surveyorDischarge Planning.   Education Outcome: Acknowledges education/In group clarification offered/Needs additional education.   Clinical Observations/Feedback: Pt was asked why communication was important and pt stated "we live in Boyd crazy world, so I communicate with God".  Pt did not participate in activity.  Pt was writing in his journal then left early and did not return.   [name], [name]      [name], [name] 11/02/2016 12:21 PM

## 2016-11-02 NOTE — [facility] Group Notes (Signed)
[facility] LCSW Group Therapy  11/02/2016  1:05 PM  Type of Therapy:  Group therapy  Participation Level:  Active  Participation Quality:  Attentive  Affect:  Flat  Cognitive:  Oriented  Insight:  Limited  Engagement in Therapy:  Limited  Modes of Intervention:  Discussion, Socialization  Summary of Progress/Problems:  Chaplain was here to lead a group on themes of hope and courage.  Intrusive, disorganized, provocative, difficult to redirect, though he eventually would put up his hand and wait for a certain amount of time to talk until he blurted out.  I walked him out at one point, but he eventually returned.  Daryel Geraldorth, Makye Radle B 11/02/2016 1:32 PM

## 2016-11-02 NOTE — Progress Notes (Signed)
DAR NOTE: Patient mood remained labile, intrusive and impulsive.  Patient continues to posture threateningly on the unit.  Patient continues to need redirection.  Denies pain, auditory and visual hallucinations.  Rates depression at 0, hopelessness at 0, and anxiety at 0.  Maintained on routine safety checks.  Medications given as prescribed.  Support and encouragement offered as needed.  Attended group and participated.  Patient observed socializing with peers in the dayroom.  Offered no complaint.

## 2016-11-02 NOTE — Progress Notes (Signed)
D: Pt who is very paranoid and disorganized denies any form of depression, anxiety, pain, SI, HI or AVH; states, "why do you keep asking me those questions as if my answers will change; I don't know what plans you have for me but it's all NO." Pt is very intrusive, continually use racial slurs, and curse words. Pt is restless and could be very loud at times.  A: Medications offered as prescribed.  Support, encouragement, and safe environment provided.  15-minute safety checks continue. R: Pt was med compliant.  Pt attended karaoke group however left early. Pt offer no complains. Safety checks continue.

## 2016-11-02 NOTE — Progress Notes (Signed)
[facility] MD Progress Note  11/02/2016 12:26 PM [name]  MRN:  [id number] Subjective:  Patient states " I am OK."   Objective: Dylan Thorntonis a 55 y.o.AA male, who has a hx of schizophrenia , noncompliance with medications , is single , on SSD ( per report) , has a payee, reports he is currently homeless after losing his apartment , presented to [facility] ED for aggressive , bizarre behavior.   Patient seen and chart reviewed.Discussed patient with treatment team.  Pt today seen as disorganized on and off - is intrusive and impulsive requiring redirection on the unit. Pt per staff has been having periodic mood lability - seen as using racial slurs and profanity often and continues to need PRN medications. Discussed a mood stabilizer with patient , he declines. Will continue to uptitrate geodon. Per staff he is tolerating medications well, denies ADRs. Will continue to provide encouragement and support .    Principal Problem: Paranoid schizophrenia, chronic condition with acute exacerbation (HCC) Diagnosis:   Patient Active Problem List   Diagnosis Date Noted  . Diabetes mellitus (HCC) [E11.9] 10/30/2016  . Essential hypertension [I10] 10/30/2016  . Paranoid schizophrenia, chronic condition with acute exacerbation (HCC) [F20.0] 10/29/2016   Total Time spent with patient: 25 minutes  Past Psychiatric History: Please see H&P.   Past Medical History:  Past Medical History:  Diagnosis Date  . Diabetes mellitus   . Diabetic neuropathy (HCC)   . High cholesterol   . Hypertension   . Schizophrenia [facility])     Past Surgical History:  Procedure Laterality Date  . TONSILLECTOMY     Family History:  Family History  Problem Relation Age of Onset  . Hypertension Mother   . Cancer Father   . Alcoholism Other    Family Psychiatric  History: Please see H&P.  Social History: Please see H&P.  History  Alcohol Use No     History  Drug Use No    Social  History   Social History  . Marital status: Single    Spouse name: N/A  . Number of children: N/A  . Years of education: N/A   Social History Main Topics  . Smoking status: Current Every Day Smoker    Packs/day: 0.50    Types: Cigarettes  . Smokeless tobacco: Never Used  . Alcohol use No  . Drug use: No  . Sexual activity: Yes   Other Topics Concern  . None   Social History Narrative  . None   Additional Social History:    Pain Medications: Denies Prescriptions: See MAR Over the Counter: Denies History of alcohol / drug use?: No history of alcohol / drug abuse                    Sleep: Fair  Appetite:  Fair  Current Medications: Current Facility-Administered Medications  Medication Dose Route Frequency Provider Last Rate Last Dose  . acetaminophen (TYLENOL) tablet 650 mg  650 mg Oral Q6H PRN Kerry Hough, PA-C   650 mg at 11/01/16 0422  . albuterol (PROVENTIL HFA;VENTOLIN HFA) 108 (90 Base) MCG/ACT inhaler 2 puff  2 puff Inhalation Q4H PRN Kerry Hough, PA-C      . amLODipine (NORVASC) tablet 5 mg  5 mg Oral Daily Kerry Hough, PA-C   5 mg at 11/02/16 0755  . atorvastatin (LIPITOR) tablet 20 mg  20 mg Oral Daily Kerry Hough, PA-C   20 mg at 11/02/16 0755  .  feeding supplement (ENSURE ENLIVE) (ENSURE ENLIVE) liquid 237 mL  237 mL Oral BID BM Algie Cales, MD   237 mL at 11/02/16 1003  . gabapentin (NEURONTIN) capsule 600 mg  600 mg Oral TID Jomarie LongsSaramma Kraig Genis, MD      . hydrochlorothiazide (HYDRODIURIL) tablet 25 mg  25 mg Oral Daily Kerry HoughSpencer E Simon, PA-C   25 mg at 11/02/16 0755  . hydrOXYzine (ATARAX/VISTARIL) tablet 25 mg  25 mg Oral Q6H PRN Kerry HoughSpencer E Simon, PA-C      . insulin aspart (novoLOG) injection 0-5 Units  0-5 Units Subcutaneous QHS Kamarrion Stfort, MD      . insulin aspart (novoLOG) injection 0-9 Units  0-9 Units Subcutaneous TID WC Jomarie LongsSaramma Albertha Beattie, MD   2 Units at 11/02/16 0646  . linagliptin (TRADJENTA) tablet 5 mg  5 mg Oral Daily  Kerry HoughSpencer E Simon, PA-C   5 mg at 11/02/16 0755  . LORazepam (ATIVAN) tablet 1 mg  1 mg Oral Q6H PRN Jomarie LongsSaramma Lashara Urey, MD       Or  . LORazepam (ATIVAN) injection 1 mg  1 mg Intramuscular Q6H PRN Jomarie LongsSaramma Jacquese Cassarino, MD      . magnesium hydroxide (MILK OF MAGNESIA) suspension 30 mL  30 mL Oral Daily PRN Kerry HoughSpencer E Simon, PA-C      . metFORMIN (GLUCOPHAGE) tablet 1,000 mg  1,000 mg Oral BID WC Kerry HoughSpencer E Simon, PA-C   1,000 mg at 11/02/16 0755  . nicotine polacrilex (NICORETTE) gum 2 mg  2 mg Oral PRN Kerry HoughSpencer E Simon, PA-C   2 mg at 11/01/16 1409  . OLANZapine (ZYPREXA) tablet 10 mg  10 mg Oral TID PRN Jomarie LongsSaramma Corin Formisano, MD       Or  . OLANZapine (ZYPREXA) injection 10 mg  10 mg Intramuscular TID PRN Jomarie LongsSaramma Amorah Sebring, MD      . pantoprazole (PROTONIX) EC tablet 40 mg  40 mg Oral Daily Kerry HoughSpencer E Simon, PA-C   40 mg at 11/02/16 0755  . traZODone (DESYREL) tablet 100 mg  100 mg Oral QHS Jomarie LongsSaramma Reinhold Rickey, MD   100 mg at 11/01/16 2113  . trihexyphenidyl (ARTANE) tablet 2 mg  2 mg Oral QHS Jomarie LongsSaramma Lelia Jons, MD   2 mg at 11/01/16 2113  . ziprasidone (GEODON) capsule 80 mg  80 mg Oral BID WC Jomarie LongsSaramma Lakaisha Danish, MD        Lab Results:  Results for orders placed or performed during the hospital encounter of 10/29/16 (from the past 48 hour(s))  Glucose, capillary     Status: Abnormal   Collection Time: 10/31/16  5:09 PM  Result Value Ref Range   Glucose-Capillary 181 (H) 65 - 99 mg/dL  Glucose, capillary     Status: Abnormal   Collection Time: 10/31/16  8:32 PM  Result Value Ref Range   Glucose-Capillary 160 (H) 65 - 99 mg/dL  Glucose, capillary     Status: Abnormal   Collection Time: 11/01/16  6:03 AM  Result Value Ref Range   Glucose-Capillary 139 (H) 65 - 99 mg/dL  Glucose, capillary     Status: Abnormal   Collection Time: 11/01/16 11:26 AM  Result Value Ref Range   Glucose-Capillary 138 (H) 65 - 99 mg/dL   Comment 1 Notify RN    Comment 2 Document in Chart   Glucose, capillary     Status: Abnormal   Collection  Time: 11/01/16  4:36 PM  Result Value Ref Range   Glucose-Capillary 192 (H) 65 - 99 mg/dL   Comment 1 Notify  RN    Comment 2 Document in Chart   Glucose, capillary     Status: Abnormal   Collection Time: 11/01/16  9:35 PM  Result Value Ref Range   Glucose-Capillary 194 (H) 65 - 99 mg/dL  Glucose, capillary     Status: Abnormal   Collection Time: 11/02/16  6:42 AM  Result Value Ref Range   Glucose-Capillary 167 (H) 65 - 99 mg/dL  Glucose, capillary     Status: Abnormal   Collection Time: 11/02/16 12:00 PM  Result Value Ref Range   Glucose-Capillary 112 (H) 65 - 99 mg/dL    Blood Alcohol level:  Lab Results  Component Value Date   ETH <5 05/01/2016   ETH  11/09/2010    <5        LOWEST DETECTABLE LIMIT FOR SERUM ALCOHOL IS 5 mg/dL FOR MEDICAL PURPOSES ONLY    Metabolic Disorder Labs: Lab Results  Component Value Date   HGBA1C 9.4 (H) 10/31/2016   MPG 223 10/31/2016   MPG 223 10/30/2016   Lab Results  Component Value Date   PROLACTIN 43.1 (H) 10/31/2016   PROLACTIN 64.6 (H) 10/30/2016   Lab Results  Component Value Date   CHOL 149 10/30/2016   TRIG 105 10/30/2016   HDL 54 10/30/2016   CHOLHDL 2.8 10/30/2016   VLDL 21 10/30/2016   LDLCALC 74 10/30/2016   LDLCALC  10/25/2009    52        Total Cholesterol/HDL:CHD Risk Coronary Heart Disease Risk Table                     Men   Women  1/2 Average Risk   3.4   3.3  Average Risk       5.0   4.4  2 X Average Risk   9.6   7.1  3 X Average Risk  23.4   11.0        Use the calculated Patient Ratio above and the CHD Risk Table to determine the patient's CHD Risk.        ATP III CLASSIFICATION (LDL):  <100     mg/dL   Optimal  132-440100-129  mg/dL   Near or Above                    Optimal  130-159  mg/dL   Borderline  102-725160-189  mg/dL   High  >366>190     mg/dL   Very High    Physical Findings: AIMS: Facial and Oral Movements Muscles of Facial Expression: None, normal Lips and Perioral Area: None, normal Jaw: None,  normal Tongue: None, normal,Extremity Movements Upper (arms, wrists, hands, fingers): None, normal Lower (legs, knees, ankles, toes): None, normal, Trunk Movements Neck, shoulders, hips: None, normal, Overall Severity Severity of abnormal movements (highest score from questions above): None, normal Incapacitation due to abnormal movements: None, normal Patient's awareness of abnormal movements (rate only patient's report): No Awareness, Dental Status Current problems with teeth and/or dentures?: No Does patient usually wear dentures?: No  CIWA:    COWS:     Musculoskeletal: Strength & Muscle Tone: within normal limits Gait & Station: normal Patient leans: N/A  Psychiatric Specialty Exam: Physical Exam  Nursing note and vitals reviewed.   Review of Systems  Psychiatric/Behavioral: The patient is nervous/anxious.   All other systems reviewed and are negative.   Blood pressure 124/86, pulse 88, temperature 98.4 F (36.9 C), temperature source Oral, resp. rate 18, height 5'  11.5" (1.816 m), weight 76.2 kg (168 lb), SpO2 97 %.Body mass index is 23.1 kg/m.  General Appearance: Fairly Groomed  Eye Contact:  Fair  Speech:  Pressured  Volume:  Normal  Mood:  Anxious  Affect:  Labile  Thought Process:  Disorganized and Descriptions of Associations: Tangential  Orientation:  Full (Time, Place, and Person)  Thought Content:  Delusions, Paranoid Ideation, Rumination and Tangential on and off   Suicidal Thoughts:  No  Homicidal Thoughts:  No  Memory:  Immediate;   Fair Recent;   Fair Remote;   Fair  Judgement:  Impaired  Insight:  Shallow  Psychomotor Activity:  Restlessness  Concentration:  Concentration: Fair and Attention Span: Poor  Recall:  [employer] of Knowledge:  Fair  Language:  Fair  Akathisia:  No  Handed:  Right  AIMS (if indicated):     Assets:  Communication Skills Desire for Improvement  ADL's:  Intact  Cognition:  WNL  Sleep:  Number of Hours: 5.5      Treatment Plan Summary: Patient continues to be intrusive and labile , he continues to need treatment , since he continues to be unstable.   Will continue today 11/10 /17 plan as below except where it is noted.  Daily contact with patient to assess and evaluate symptoms and progress in treatment and Medication management Will increase Geodon to 80 mg po bid with meals for psychosis. Will continue Trazodone 100 mg po qhs for sleep. Restarted home medications where indicated. DM management as per unit protocol. Reviewed past medical records,treatment plan.  Will continue to monitor vitals ,medication compliance and treatment side effects while patient is here.  Will monitor for medical issues as well as call consult as needed.  Reviewed labs - CMP - wnl, UDS- negative, lipid panel - wnl,  UA- negative  , reviewed  TSH- wnl , hba1c- 9.4 - elevated - dietician consult , pl- 43.1 - will need to be monitored on an out patient basis. I have reviewed EKG - qtc - wnl. CSW will continue working on disposition.  Patient to participate in therapeutic milieu  Kristelle Cavallaro, MD 11/02/2016, 12:26 PM

## 2016-11-03 DIAGNOSIS — F200 Paranoid schizophrenia: Principal | ICD-10-CM

## 2016-11-03 DIAGNOSIS — F17210 Nicotine dependence, cigarettes, uncomplicated: Secondary | ICD-10-CM

## 2016-11-03 DIAGNOSIS — Z808 Family history of malignant neoplasm of other organs or systems: Secondary | ICD-10-CM

## 2016-11-03 DIAGNOSIS — Z79899 Other long term (current) drug therapy: Secondary | ICD-10-CM

## 2016-11-03 DIAGNOSIS — Z8249 Family history of ischemic heart disease and other diseases of the circulatory system: Secondary | ICD-10-CM

## 2016-11-03 DIAGNOSIS — Z811 Family history of alcohol abuse and dependence: Secondary | ICD-10-CM

## 2016-11-03 LAB — GLUCOSE, CAPILLARY
GLUCOSE-CAPILLARY: 164 mg/dL — AB (ref 65–99)
GLUCOSE-CAPILLARY: 101 mg/dL — AB (ref 65–99)
Glucose-Capillary: 242 mg/dL — ABNORMAL HIGH (ref 65–99)
Glucose-Capillary: 138 mg/dL — ABNORMAL HIGH (ref 65–99)

## 2016-11-03 MED ORDER — DIVALPROEX SODIUM ER 500 MG PO TB24
500.0000 mg | ORAL_TABLET | Freq: Two times a day (BID) | ORAL | Status: DC
  Administered 2016-11-03 – 2016-11-05 (×4): 500 mg via ORAL
  Filled 2016-11-03 (×7): qty 1

## 2016-11-03 MED ORDER — DIVALPROEX SODIUM 500 MG PO DR TAB
DELAYED_RELEASE_TABLET | ORAL | Status: AC
  Administered 2016-11-03: 500 mg
  Filled 2016-11-03: qty 1

## 2016-11-03 MED ORDER — DIVALPROEX SODIUM ER 500 MG PO TB24
500.0000 mg | ORAL_TABLET | Freq: Every day | ORAL | Status: DC
  Filled 2016-11-03 (×3): qty 1
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
## 2016-11-03 NOTE — Progress Notes (Signed)
[facility]  11/03/2016 1:03 PM [name]  MRN:  [id number] Subjective:  Patient states " this nothing wrong with me.  People are messing with me."   Objective: [name] a 55 y.o.AA male, who has a hx of schizophrenia , noncompliance with medications , is single , on SSD ( per report) , has a payee, reports he is currently homeless after losing his apartment , [facility] for aggressive , bizarre behavior.   Patient seen and chart reviewed.  Patient remains very delusional, inappropriate and labile.  He is having issues with other client.  In the morning you pushed staff members and became very agitated with loud and using cursing and profanity.  He was given when necessary medication.  He is taking Geodon but keep refusing mood stabilizer.  He remains very disorganized and preoccupied with his paranoia that people are following him.  He admitted not comfortable around Caucasian people and continued to have outburst and using racial slurs.as per staff he sleeping on and off.  He is very disruptive in groups.  Though he does not report any side effects from Geodon.   Principal Problem: Paranoid schizophrenia, chronic condition with acute exacerbation (HCC) Diagnosis:   Patient Active Problem List   Diagnosis Date Noted  . Diabetes mellitus (HCC) [E11.9] 10/30/2016  . Essential hypertension [I10] 10/30/2016  . Paranoid schizophrenia, chronic condition with acute exacerbation (HCC) [F20.0] 10/29/2016   Total Time spent with patient: 25 minutes  Past Psychiatric History: Please see H&P.   Past Medical History:  Past Medical History:  Diagnosis Date  . Diabetes mellitus   . Diabetic neuropathy (HCC)   . High cholesterol   . Hypertension   . Schizophrenia [facility](HCC)     Past Surgical History:  Procedure Laterality Date  . TONSILLECTOMY     Family History:  Family History  Problem Relation Age of Onset  . Hypertension Mother   .  Cancer Father   . Alcoholism Other    Family Psychiatric  History: Please see H&P.  Social History: Please see H&P.  History  Alcohol Use No     History  Drug Use No    Social History   Social History  . Marital status: Single    Spouse name: N/A  . Number of children: N/A  . Years of education: N/A   Social History Main Topics  . Smoking status: Current Every Day Smoker    Packs/day: 0.50    Types: Cigarettes  . Smokeless tobacco: Never Used  . Alcohol use No  . Drug use: No  . Sexual activity: Yes   Other Topics Concern  . None   Social History Narrative  . None   Additional Social History:    Pain Medications: Denies Prescriptions: See MAR Over the Counter: Denies History of alcohol / drug use?: No history of alcohol / drug abuse                    Sleep: Fair  Appetite:  Fair  Current Medications: Current Facility-Administered Medications  Medication Dose Route Frequency Provider Last Rate Last Dose  . acetaminophen (TYLENOL) tablet 650 mg  650 mg Oral Q6H PRN Kerry HoughSpencer E Simon, PA-C   650 mg at 11/01/16 0422  . albuterol (PROVENTIL HFA;VENTOLIN HFA) 108 (90 Base) MCG/ACT inhaler 2 puff  2 puff Inhalation Q4H PRN Kerry HoughSpencer E Simon, PA-C      . amLODipine (NORVASC) tablet 5 mg  5 mg  Oral Daily Kerry HoughSpencer E Simon, PA-C   5 mg at 11/03/16 [id number]  . atorvastatin (LIPITOR) tablet 20 mg  20 mg Oral Daily Kerry HoughSpencer E Simon, PA-C   20 mg at 11/03/16 0810  . divalproex (DEPAKOTE ER) 24 hr tablet 500 mg  500 mg Oral Daily Cleotis NipperSyed T Shawnette Augello, MD      . feeding supplement (ENSURE ENLIVE) (ENSURE ENLIVE) liquid 237 mL  237 mL Oral BID BM Saramma Eappen, MD   237 mL at 11/03/16 0814  . gabapentin (NEURONTIN) capsule 600 mg  600 mg Oral TID Jomarie LongsSaramma Eappen, MD   600 mg at 11/03/16 1207  . hydrochlorothiazide (HYDRODIURIL) tablet 25 mg  25 mg Oral Daily Kerry HoughSpencer E Simon, PA-C   25 mg at 11/03/16 [id number]  . hydrOXYzine (ATARAX/VISTARIL) tablet 25 mg  25 mg Oral Q6H PRN Kerry HoughSpencer E  Simon, PA-C      . insulin aspart (novoLOG) injection 0-5 Units  0-5 Units Subcutaneous QHS Saramma Eappen, MD      . insulin aspart (novoLOG) injection 0-9 Units  0-9 Units Subcutaneous TID WC Jomarie LongsSaramma Eappen, MD   2 Units at 11/03/16 [id number]  . linagliptin (TRADJENTA) tablet 5 mg  5 mg Oral Daily Kerry HoughSpencer E Simon, PA-C   5 mg at 11/03/16 0810  . LORazepam (ATIVAN) tablet 1 mg  1 mg Oral Q6H PRN Jomarie LongsSaramma Eappen, MD   1 mg at 11/03/16 0810   Or  . LORazepam (ATIVAN) injection 1 mg  1 mg Intramuscular Q6H PRN Jomarie LongsSaramma Eappen, MD      . magnesium hydroxide (MILK OF MAGNESIA) suspension 30 mL  30 mL Oral Daily PRN Kerry HoughSpencer E Simon, PA-C      . metFORMIN (GLUCOPHAGE) tablet 1,000 mg  1,000 mg Oral BID WC Kerry HoughSpencer E Simon, PA-C   1,000 mg at 11/03/16 0810  . nicotine polacrilex (NICORETTE) gum 2 mg  2 mg Oral PRN Kerry HoughSpencer E Simon, PA-C   2 mg at 11/03/16 1052  . OLANZapine (ZYPREXA) tablet 10 mg  10 mg Oral TID PRN Jomarie LongsSaramma Eappen, MD   10 mg at 11/03/16 0810   Or  . OLANZapine (ZYPREXA) injection 10 mg  10 mg Intramuscular TID PRN Jomarie LongsSaramma Eappen, MD      . pantoprazole (PROTONIX) EC tablet 40 mg  40 mg Oral Daily Kerry HoughSpencer E Simon, PA-C   40 mg at 11/03/16 0810  . traZODone (DESYREL) tablet 100 mg  100 mg Oral QHS Jomarie LongsSaramma Eappen, MD   100 mg at 11/02/16 2112  . trihexyphenidyl (ARTANE) tablet 2 mg  2 mg Oral QHS Jomarie LongsSaramma Eappen, MD   2 mg at 11/02/16 2112  . ziprasidone (GEODON) capsule 80 mg  80 mg Oral BID WC Jomarie LongsSaramma Eappen, MD   80 mg at 11/03/16 [id number]    Lab Results:  Results for orders placed or performed during the hospital encounter of 10/29/16 (from the past 48 hour(s))  Glucose, capillary     Status: Abnormal   Collection Time: 11/01/16  4:36 PM  Result Value Ref Range   Glucose-Capillary 192 (H) 65 - 99 mg/dL   Comment 1 Notify RN    Comment 2 Document in Chart   Glucose, capillary     Status: Abnormal   Collection Time: 11/01/16  9:35 PM  Result Value Ref Range   Glucose-Capillary 194 (H) 65 -  99 mg/dL  Glucose, capillary     Status: Abnormal   Collection Time: 11/02/16  6:42 AM  Result Value Ref Range  Glucose-Capillary 167 (H) 65 - 99 mg/dL  Glucose, capillary     Status: Abnormal   Collection Time: 11/02/16 12:00 PM  Result Value Ref Range   Glucose-Capillary 112 (H) 65 - 99 mg/dL  Glucose, capillary     Status: Abnormal   Collection Time: 11/02/16  4:54 PM  Result Value Ref Range   Glucose-Capillary 108 (H) 65 - 99 mg/dL   Comment 1 Notify RN    Comment 2 Document in Chart   Glucose, capillary     Status: Abnormal   Collection Time: 11/02/16  8:40 PM  Result Value Ref Range   Glucose-Capillary 101 (H) 65 - 99 mg/dL  Glucose, capillary     Status: Abnormal   Collection Time: 11/03/16  6:15 AM  Result Value Ref Range   Glucose-Capillary 164 (H) 65 - 99 mg/dL  Glucose, capillary     Status: Abnormal   Collection Time: 11/03/16 11:37 AM  Result Value Ref Range   Glucose-Capillary 101 (H) 65 - 99 mg/dL    Blood Alcohol level:  Lab Results  Component Value Date   ETH <5 05/01/2016   ETH  11/09/2010    <5        LOWEST DETECTABLE LIMIT FOR SERUM ALCOHOL IS 5 mg/dL FOR MEDICAL PURPOSES ONLY    Metabolic Disorder Labs: Lab Results  Component Value Date   HGBA1C 9.4 (H) 10/31/2016   MPG 223 10/31/2016   MPG 223 10/30/2016   Lab Results  Component Value Date   PROLACTIN 43.1 (H) 10/31/2016   PROLACTIN 64.6 (H) 10/30/2016   Lab Results  Component Value Date   CHOL 149 10/30/2016   TRIG 105 10/30/2016   HDL 54 10/30/2016   CHOLHDL 2.8 10/30/2016   VLDL 21 10/30/2016   LDLCALC 74 10/30/2016   LDLCALC  10/25/2009    52        Total Cholesterol/HDL:CHD Risk Coronary Heart Disease Risk Table                     Men   Women  1/2 Average Risk   3.4   3.3  Average Risk       5.0   4.4  2 X Average Risk   9.6   7.1  3 X Average Risk  23.4   11.0        Use the calculated Patient Ratio above and the CHD Risk Table to determine the patient's CHD  Risk.        ATP III CLASSIFICATION (LDL):  <100     mg/dL   Optimal  161-096  mg/dL   Near or Above                    Optimal  130-159  mg/dL   Borderline  045-409  mg/dL   High  >811     mg/dL   Very High    Physical Findings: AIMS: Facial and Oral Movements Muscles of Facial Expression: None, normal Lips and Perioral Area: None, normal Jaw: None, normal Tongue: None, normal,Extremity Movements Upper (arms, wrists, hands, fingers): None, normal Lower (legs, knees, ankles, toes): None, normal, Trunk Movements Neck, shoulders, hips: None, normal, Overall Severity Severity of abnormal movements (highest score from questions above): None, normal Incapacitation due to abnormal movements: None, normal Patient's awareness of abnormal movements (rate only patient's report): No Awareness, Dental Status Current problems with teeth and/or dentures?: No Does patient usually wear dentures?: No  CIWA:  COWS:     Musculoskeletal: Strength & Muscle Tone: within normal limits Gait & Station: normal Patient leans: N/A  Psychiatric Specialty Exam: Physical Exam  Nursing note and vitals reviewed.   Review of Systems  Constitutional: Negative.   HENT: Negative.   Eyes: Negative.   Respiratory: Negative.   Cardiovascular: Negative.   Gastrointestinal: Negative.   Skin: Negative.   Neurological: Negative for tremors and loss of consciousness.  Psychiatric/Behavioral: Positive for hallucinations. The patient is nervous/anxious and has insomnia.   All other systems reviewed and are negative.   Blood pressure 128/75, pulse (!) 112, temperature 97.3 F (36.3 C), temperature source Oral, resp. rate 16, height 5' 11.5" (1.816 m), weight 76.2 kg (168 lb), SpO2 97 %.Body mass index is 23.1 kg/m.  General Appearance: Disheveled  Eye Contact:  Poor  Speech:  Pressured, rambling  Volume:  Normal  Mood:  Angry and Irritable  Affect:  Labile  Thought Process:  Disorganized and Descriptions  of Associations: Tangential  Orientation:  Full (Time, Place, and Person)  Thought Content:  Delusions, Paranoid Ideation, Rumination and Tangential on and off   Suicidal Thoughts:  No  Homicidal Thoughts:  No  Memory:  Immediate;   Fair Recent;   Fair Remote;   Fair  Judgement:  Impaired  Insight:  Shallow  Psychomotor Activity:  Restlessness  Concentration:  Concentration: Fair and Attention Span: Poor  Recall:  [employer] of Knowledge:  Fair  Language:  Fair  Akathisia:  No  Handed:  Right  AIMS (if indicated):     Assets:  Communication Skills Desire for Improvement  ADL's:  Intact  Cognition:  WNL  Sleep:  Number of Hours: 4.25     Treatment Plan Summary: Patient continues to be intrusive and labile , he continues to need treatment , since he continues to be unstable.   Will continue today 11/11 /17 plan as below except where it is noted.  Daily contact with patient to assess and evaluate symptoms and progress in treatment and Medication management Continue Geodon 80 mg po bid with meals for psychosis. continue Trazodone 100 mg po qhs for sleep. After some encouragement he agreed to take Depakote and he took first dose this morning.  We will start Depakote 500 mg twice a day.  I explained risk and benefits of medication.  He do not recall taking Depakote in the past.continue when necessary medication for agitation. Will continue to monitor vitals ,medication compliance and treatment side effects while patient is here.  Will monitor for medical issues as well as call consult as needed.  Reviewed labs - CMP - wnl, UDS- negative, lipid panel - wnl,  UA- negative  , reviewed  TSH- wnl , hba1c- 9.4 - elevated - dietician consult , pl- 43.1 - will need to be monitored on an out patient basis. I have reviewed EKG - qtc - wnl. CSW will continue working on disposition.  Patient to participate in therapeutic milieu  Teruo Stilley T., MD 11/03/2016, 1:03 PM

## 2016-11-03 NOTE — Progress Notes (Signed)
Did not attend group 

## 2016-11-03 NOTE — Plan of Care (Signed)
Problem: Activity: Goal: Will verbalize the importance of balancing activity with adequate rest periods Outcome: Not Progressing Nurse discussed anxiety/coping skills with patient.

## 2016-11-03 NOTE — [facility] Group Notes (Signed)
[facility] Group Notes: (Clinical Social Work)   11/03/2016      Type of Therapy:  Group Therapy   Participation Level:  Did Not Attend despite MHT prompting - he did come in at the end of group and sat down for the last 10 minutes, went to sleep.   Dylan MantleMareida Grossman-Orr, LCSW 11/03/2016, 2:04 PM

## 2016-11-03 NOTE — Progress Notes (Signed)
Patient rested in his bed this afternoon.  Went to dayroom and rested with eyes closed while sitting in chair.  Patient continues with racial remarks to staff, loud.  Ativan prn given for anxiety.  Safety maintained with 15 minute checks.

## 2016-11-03 NOTE — Progress Notes (Signed)
Patient's self inventory sheet, patient sleeps good, no sleep medication given.  Good appetite, normal energy level, good concentration.  Denied depression, hopeless and anxiety  Denied withdrawals.  Denied SI.  Denied physical problems.  Denied pain. Safety maintained with 15 minute checks.

## 2016-11-03 NOTE — [facility] Group Notes (Signed)
The focus of this group is to educate the patient on the purpose and policies of crisis stabilization and provide a format to answer questions about their admission.  The group details unit policies and expectations of patients while admitted.  Patient came into group twice and then left group.  Once he walked to front of group to get coffee and then left group without saying a word.

## 2016-11-03 NOTE — Progress Notes (Signed)
Writer has observed patient up in the dayroom talking to who ever will listen to him. His conversations are very disorganized. He was very disruptive in group talking over other patients. He has made various racial statements to caucasian staff and patients on the hall. Patients have ignored him. He was compliant with his scheduled medications. Safety maintained on unit with 15 min checks.

## 2016-11-04 LAB — GLUCOSE, CAPILLARY
GLUCOSE-CAPILLARY: 201 mg/dL — AB (ref 65–99)
GLUCOSE-CAPILLARY: 153 mg/dL — AB (ref 65–99)
Glucose-Capillary: 104 mg/dL — ABNORMAL HIGH (ref 65–99)
Glucose-Capillary: 150 mg/dL — ABNORMAL HIGH (ref 65–99)

## 2016-11-04 NOTE — Progress Notes (Signed)
Writer has observed patient sitting in the dayroom asleep at beginning of shift. He later woke up and requested something to eat which he received. He sat in the dayroom and had conversation with another peer about religion briefly and the caucasian race of people. He requested his medications after his cbg was done and asked writer if I had seen a movie that he made reference about the white man killing black people by running over them. He continues to be fixated on making reference to white people by calling them crackers and african americans as niggers. Writer asked that he not use these words so freely and he completely ignored Clinical research associatewriter. Will continue to monitor patients behavior and conversations.

## 2016-11-04 NOTE — Progress Notes (Signed)
[facility]  11/04/2016 10:08 AM Katheren Pullerapoleon Blakney  MRN:  [id number] Subjective:  Patient states "I slept well last night.  I like Depakote."   Objective: Erico Thorntonis a 55 y.o.AA male, who has a hx of schizophrenia , noncompliance with medications , is single , on SSD ( per report) , has a payee, reports he is currently homeless after losing his apartment , [facility] for aggressive , bizarre behavior. Patient seen and chart reviewed.  Patient is not taking Depakote 500 mg twice a day.  He is less irritable and less labile but continued to have paranoia, disorganized thinking at times inappropriate behavior.  He gets easily disruptive but able to respond to verbal directions.he is taking Depakote 500 mg twice a day.  His sleep is improved and he reported no side effects.  He continued to feel paranoid and endorsed that people harassing him and sometime he continue racial slurs.  He has no tremors or shakes.  Principal Problem: Paranoid schizophrenia, chronic condition with acute exacerbation (HCC) Diagnosis:   Patient Active Problem List   Diagnosis Date Noted  . Diabetes mellitus (HCC) [E11.9] 10/30/2016  . Essential hypertension [I10] 10/30/2016  . Paranoid schizophrenia, chronic condition with acute exacerbation (HCC) [F20.0] 10/29/2016   Total Time spent with patient: 25 minutes  Past Psychiatric History: Please see H&P.   Past Medical History:  Past Medical History:  Diagnosis Date  . Diabetes mellitus   . Diabetic neuropathy (HCC)   . High cholesterol   . Hypertension   . Schizophrenia [facility](HCC)     Past Surgical History:  Procedure Laterality Date  . TONSILLECTOMY     Family History:  Family History  Problem Relation Age of Onset  . Hypertension Mother   . Cancer Father   . Alcoholism Other    Family Psychiatric  History: Please see H&P.  Social History: Please see H&P.  History  Alcohol Use No     History  Drug Use No     Social History   Social History  . Marital status: Single    Spouse name: N/A  . Number of children: N/A  . Years of education: N/A   Social History Main Topics  . Smoking status: Current Every Day Smoker    Packs/day: 0.50    Types: Cigarettes  . Smokeless tobacco: Never Used  . Alcohol use No  . Drug use: No  . Sexual activity: Yes   Other Topics Concern  . None   Social History Narrative  . None   Additional Social History:    Pain Medications: Denies Prescriptions: See MAR Over the Counter: Denies History of alcohol / drug use?: No history of alcohol / drug abuse     Sleep: Fair  Appetite:  Fair  Current Medications: Current Facility-Administered Medications  Medication Dose Route Frequency Provider Last Rate Last Dose  . acetaminophen (TYLENOL) tablet 650 mg  650 mg Oral Q6H PRN Kerry HoughSpencer E Simon, PA-C   650 mg at 11/01/16 0422  . albuterol (PROVENTIL HFA;VENTOLIN HFA) 108 (90 Base) MCG/ACT inhaler 2 puff  2 puff Inhalation Q4H PRN Kerry HoughSpencer E Simon, PA-C      . amLODipine (NORVASC) tablet 5 mg  5 mg Oral Daily Kerry HoughSpencer E Simon, PA-C   5 mg at 11/04/16 0745  . atorvastatin (LIPITOR) tablet 20 mg  20 mg Oral Daily Kerry HoughSpencer E Simon, PA-C   20 mg at 11/04/16 0746  . divalproex (DEPAKOTE ER) 24 hr tablet  500 mg  500 mg Oral BID WC Cleotis Nipper, MD   500 mg at 11/04/16 0746  . feeding supplement (ENSURE ENLIVE) (ENSURE ENLIVE) liquid 237 mL  237 mL Oral BID BM Saramma Eappen, MD   237 mL at 11/03/16 1449  . gabapentin (NEURONTIN) capsule 600 mg  600 mg Oral TID Jomarie Longs, MD   600 mg at 11/04/16 0747  . hydrochlorothiazide (HYDRODIURIL) tablet 25 mg  25 mg Oral Daily Kerry Hough, PA-C   25 mg at 11/04/16 0746  . hydrOXYzine (ATARAX/VISTARIL) tablet 25 mg  25 mg Oral Q6H PRN Kerry Hough, PA-C      . insulin aspart (novoLOG) injection 0-5 Units  0-5 Units Subcutaneous QHS Saramma Eappen, MD      . insulin aspart (novoLOG) injection 0-9 Units  0-9 Units  Subcutaneous TID WC Jomarie Longs, MD   3 Units at 11/04/16 0601  . linagliptin (TRADJENTA) tablet 5 mg  5 mg Oral Daily Kerry Hough, PA-C   5 mg at 11/04/16 0746  . LORazepam (ATIVAN) tablet 1 mg  1 mg Oral Q6H PRN Jomarie Longs, MD   1 mg at 11/03/16 1533   Or  . LORazepam (ATIVAN) injection 1 mg  1 mg Intramuscular Q6H PRN Jomarie Longs, MD      . magnesium hydroxide (MILK OF MAGNESIA) suspension 30 mL  30 mL Oral Daily PRN Kerry Hough, PA-C      . metFORMIN (GLUCOPHAGE) tablet 1,000 mg  1,000 mg Oral BID WC Kerry Hough, PA-C   1,000 mg at 11/04/16 0747  . nicotine polacrilex (NICORETTE) gum 2 mg  2 mg Oral PRN Kerry Hough, PA-C   2 mg at 11/03/16 1535  . OLANZapine (ZYPREXA) tablet 10 mg  10 mg Oral TID PRN Jomarie Longs, MD   10 mg at 11/03/16 1610   Or  . OLANZapine (ZYPREXA) injection 10 mg  10 mg Intramuscular TID PRN Jomarie Longs, MD      . pantoprazole (PROTONIX) EC tablet 40 mg  40 mg Oral Daily Kerry Hough, PA-C   40 mg at 11/04/16 0746  . traZODone (DESYREL) tablet 100 mg  100 mg Oral QHS Jomarie Longs, MD   100 mg at 11/03/16 2101  . trihexyphenidyl (ARTANE) tablet 2 mg  2 mg Oral QHS Jomarie Longs, MD   2 mg at 11/03/16 2101  . ziprasidone (GEODON) capsule 80 mg  80 mg Oral BID WC Jomarie Longs, MD   80 mg at 11/04/16 0746    Lab Results:  Results for orders placed or performed during the hospital encounter of 10/29/16 (from the past 48 hour(s))  Glucose, capillary     Status: Abnormal   Collection Time: 11/02/16 12:00 PM  Result Value Ref Range   Glucose-Capillary 112 (H) 65 - 99 mg/dL  Glucose, capillary     Status: Abnormal   Collection Time: 11/02/16  4:54 PM  Result Value Ref Range   Glucose-Capillary 108 (H) 65 - 99 mg/dL   Comment 1 Notify RN    Comment 2 Document in Chart   Glucose, capillary     Status: Abnormal   Collection Time: 11/02/16  8:40 PM  Result Value Ref Range   Glucose-Capillary 101 (H) 65 - 99 mg/dL  Glucose,  capillary     Status: Abnormal   Collection Time: 11/03/16  6:15 AM  Result Value Ref Range   Glucose-Capillary 164 (H) 65 - 99 mg/dL  Glucose, capillary  Status: Abnormal   Collection Time: 11/03/16 11:37 AM  Result Value Ref Range   Glucose-Capillary 101 (H) 65 - 99 mg/dL  Glucose, capillary     Status: Abnormal   Collection Time: 11/03/16  4:54 PM  Result Value Ref Range   Glucose-Capillary 242 (H) 65 - 99 mg/dL   Comment 1 Notify RN    Comment 2 Document in Chart   Glucose, capillary     Status: Abnormal   Collection Time: 11/03/16  8:44 PM  Result Value Ref Range   Glucose-Capillary 138 (H) 65 - 99 mg/dL  Glucose, capillary     Status: Abnormal   Collection Time: 11/04/16  5:54 AM  Result Value Ref Range   Glucose-Capillary 201 (H) 65 - 99 mg/dL    Blood Alcohol level:  Lab Results  Component Value Date   ETH <5 05/01/2016   ETH  11/09/2010    <5        LOWEST DETECTABLE LIMIT FOR SERUM ALCOHOL IS 5 mg/dL FOR MEDICAL PURPOSES ONLY    Metabolic Disorder Labs: Lab Results  Component Value Date   HGBA1C 9.4 (H) 10/31/2016   MPG 223 10/31/2016   MPG 223 10/30/2016   Lab Results  Component Value Date   PROLACTIN 43.1 (H) 10/31/2016   PROLACTIN 64.6 (H) 10/30/2016   Lab Results  Component Value Date   CHOL 149 10/30/2016   TRIG 105 10/30/2016   HDL 54 10/30/2016   CHOLHDL 2.8 10/30/2016   VLDL 21 10/30/2016   LDLCALC 74 10/30/2016   LDLCALC  10/25/2009    52        Total Cholesterol/HDL:CHD Risk Coronary Heart Disease Risk Table                     Men   Women  1/2 Average Risk   3.4   3.3  Average Risk       5.0   4.4  2 X Average Risk   9.6   7.1  3 X Average Risk  23.4   11.0        Use the calculated Patient Ratio above and the CHD Risk Table to determine the patient's CHD Risk.        ATP III CLASSIFICATION (LDL):  <100     mg/dL   Optimal  [id number]  mg/dL   Near or Above                    Optimal  130-159  mg/dL   Borderline  [id number]   mg/dL   High  >956>190     mg/dL   Very High    Physical Findings: AIMS: Facial and Oral Movements Muscles of Facial Expression: None, normal Lips and Perioral Area: None, normal Jaw: None, normal Tongue: None, normal,Extremity Movements Upper (arms, wrists, hands, fingers): None, normal Lower (legs, knees, ankles, toes): None, normal, Trunk Movements Neck, shoulders, hips: None, normal, Overall Severity Severity of abnormal movements (highest score from questions above): None, normal Incapacitation due to abnormal movements: None, normal Patient's awareness of abnormal movements (rate only patient's report): No Awareness, Dental Status Current problems with teeth and/or dentures?: No Does patient usually wear dentures?: No  CIWA:  CIWA-Ar Total: 8 COWS:  COWS Total Score: 5  Musculoskeletal: Strength & Muscle Tone: within normal limits Gait & Station: normal Patient leans: N/A  Psychiatric Specialty Exam: Physical Exam  Nursing note and vitals reviewed.   Review of Systems  Constitutional: Negative.  HENT: Negative.   Eyes: Negative.   Respiratory: Negative.   Cardiovascular: Negative.   Gastrointestinal: Negative.   Skin: Negative.   Neurological: Negative for tremors and loss of consciousness.  Psychiatric/Behavioral: Positive for hallucinations. The patient is nervous/anxious and has insomnia.   All other systems reviewed and are negative.   Blood pressure 129/72, pulse (!) 108, temperature 98.4 F (36.9 C), resp. rate 18, height 5' 11.5" (1.816 m), weight 76.2 kg (168 lb), SpO2 97 %.Body mass index is 23.1 kg/m.  General Appearance: Disheveled  Eye Contact:  Fair  Speech:  Pressured, rambling  Volume:  Normal  Mood:  Angry and Irritable  Affect:  Labile  Thought Process:  Disorganized and Descriptions of Associations: Tangential  Orientation:  Full (Time, Place, and Person)  Thought Content:  Delusions, Paranoid Ideation and Tangential   Suicidal Thoughts:  No   Homicidal Thoughts:  No  Memory:  Immediate;   Fair Recent;   Fair Remote;   Fair  Judgement:  Impaired  Insight:  Shallow  Psychomotor Activity:  Increased  Concentration:  Concentration: Fair and Attention Span: Poor  Recall:  [employer] of Knowledge:  Fair  Language:  Fair  Akathisia:  No  Handed:  Right  AIMS (if indicated):     Assets:  Communication Skills Desire for Improvement  ADL's:  Intact  Cognition:  WNL  Sleep:  Number of Hours: 5.75     Treatment Plan Summary: Patient continues to be intrusive and labile , he continues to need treatment , since he continues to be unstable.   Will continue today 11/12 /17 plan as below except where it is noted.  Daily contact with patient to assess and evaluate symptoms and progress in treatment and Medication management Patient is tolerating Depakote 500 mg twice a day.  He has no side effects.  Continue Depakote 500 mg twice a day and we will get level tomorrow in the morning.  Continue Geodon 80 mg po bid with meals for psychosis. continue Trazodone 100 mg po qhs for sleep. Will continue to monitor vitals ,medication compliance and treatment side effects while patient is here.  Will monitor for medical issues as well as call consult as needed.  Reviewed labs - CMP - wnl, UDS- negative, lipid panel - wnl,  UA- negative  , reviewed  TSH- wnl , hba1c- 9.4 - elevated - dietician consult , pl- 43.1 - will need to be monitored on an out patient basis. I have reviewed EKG - qtc - wnl. CSW will continue working on disposition.  Patient to participate in therapeutic milieu  Ashon Rosenberg T., MD 11/04/2016, 10:08 AM

## 2016-11-04 NOTE — [facility] Group Notes (Signed)
[facility] Group Notes: (Clinical Social Work)   11/04/2016      Type of Therapy:  Group Therapy   Participation Level:  Did Not Attend despite MHT prompting - he came into the room several times but left.  He did come in and sit down through the last song.   Ambrose MantleMareida Grossman-Orr, LCSW 11/04/2016, 1:14 PM

## 2016-11-04 NOTE — Progress Notes (Signed)
DAR NOTE: Patient remained labile, irritable and anxious in affect and mood.  Denies pain, auditory and visual hallucinations.  Rates depression at 0, hopelessness at 0, and anxiety at 0.  Maintained on routine safety checks.  Medications given as prescribed.  Support and encouragement offered as needed.  Attended group and participated.    Patient is loud and disruptive in milieu.  Patient verbally aggressive and abusive towards staff.  Patient continues to make racial comment towards staff and is difficult to redirect verbally.  Patient received Ativan 1 mg for severe anxiety and agitation with good effect.

## 2016-11-05 LAB — GLUCOSE, CAPILLARY
GLUCOSE-CAPILLARY: 148 mg/dL — AB (ref 65–99)
Glucose-Capillary: 138 mg/dL — ABNORMAL HIGH (ref 65–99)

## 2016-11-05 MED ORDER — DIVALPROEX SODIUM 500 MG PO DR TAB: 500.0000 mg | DELAYED_RELEASE_TABLET | Freq: Two times a day (BID) | ORAL | 0 refills | Status: DC

## 2016-11-05 MED ORDER — HYDROXYZINE HCL 25 MG PO TABS: 25.0000 mg | ORAL_TABLET | Freq: Four times a day (QID) | ORAL | 0 refills | Status: DC | PRN

## 2016-11-05 MED ORDER — DIVALPROEX SODIUM 500 MG PO DR TAB
500.0000 mg | DELAYED_RELEASE_TABLET | Freq: Two times a day (BID) | ORAL | Status: DC
  Filled 2016-11-05 (×2): qty 1

## 2016-11-05 MED ORDER — TRIHEXYPHENIDYL HCL 2 MG PO TABS: 2.0000 mg | ORAL_TABLET | Freq: Every day | ORAL | 0 refills | Status: DC

## 2016-11-05 MED ORDER — NICOTINE POLACRILEX 2 MG MT GUM: 2.0000 mg | CHEWING_GUM | OROMUCOSAL | 0 refills | Status: DC | PRN

## 2016-11-05 MED ORDER — ZIPRASIDONE HCL 80 MG PO CAPS: 80.0000 mg | ORAL_CAPSULE | Freq: Two times a day (BID) | ORAL | 0 refills | Status: DC

## 2016-11-05 MED ORDER — TRAZODONE HCL 100 MG PO TABS: 100.0000 mg | ORAL_TABLET | Freq: Every day | ORAL | 0 refills | Status: DC

## 2016-11-05 MED ORDER — GABAPENTIN 300 MG PO CAPS: 600.0000 mg | ORAL_CAPSULE | Freq: Three times a day (TID) | ORAL | 0 refills | Status: DC

## 2016-11-05 NOTE — [facility] Suicide Risk Assessment (Signed)
[facility]   Principal Problem: Paranoid schizophrenia, chronic condition with acute exacerbation [facility](HCC) Discharge Diagnoses:  Patient Active Problem List   Diagnosis Date Noted  . Diabetes mellitus (HCC) [E11.9] 10/30/2016  . Essential hypertension [I10] 10/30/2016  . Paranoid schizophrenia, chronic condition with acute exacerbation (HCC) [F20.0] 10/29/2016    Total Time spent with patient: 30 minutes  Musculoskeletal: Strength & Muscle Tone: within normal limits Gait & Station: normal Patient leans: N/A  Psychiatric Specialty Exam: Review of Systems  Psychiatric/Behavioral: Negative for depression, hallucinations and suicidal ideas. The patient is not nervous/anxious.   All other systems reviewed and are negative.   Blood pressure (!) 145/86, pulse 87, temperature 97.8 F (36.6 C), temperature source Oral, resp. rate 18, height 5' 11.5" (1.816 m), weight 76.2 kg (168 lb), SpO2 97 %.Body mass index is 23.1 kg/m.  General Appearance: Casual  Eye Contact::  Fair  Speech:  Clear and Coherent409  Volume:  Normal  Mood:  Euthymic  Affect:  Appropriate  Thought Process:  Goal Directed  Orientation:  Full (Time, Place, and Person)  Thought Content:  Logical  Suicidal Thoughts:  No  Homicidal Thoughts:  No  Memory:  Immediate;   Fair Recent;   Fair Remote;   Fair  Judgement:  Fair  Insight:  Fair  Psychomotor Activity:  Normal  Concentration:  Fair  Recall:  [employer]  Fund of Knowledge:Fair  Language: Fair  Akathisia:  No  Handed:  Right  AIMS (if indicated):   0  Assets:  Desire for Improvement  Sleep:  Number of Hours: 3.5  Cognition: WNL  ADL's:  Intact   Mental Status Per Nursing Assessment::   On Admission:  NA  Demographic Factors:  Male  Loss Factors: NA  Historical Factors: Impulsivity  Risk Reduction Factors:   Positive social support and Positive therapeutic relationship  Continued Clinical Symptoms:  Previous  Psychiatric Diagnoses and Treatments  Cognitive Features That Contribute To Risk:  None    Suicide Risk:  Minimal: No identifiable suicidal ideation.  Patients presenting with no risk factors but with morbid ruminations; may be classified as minimal risk based on the severity of the depressive symptoms  Follow-up Information    [facility] Follow up.   Why:  Someone from the the ACT team will piick you up the day of d/c. Contact information: [address]  [place]  [phone]          Plan Of Care/Follow-up recommendations:  Activity:  no restrictions Diet:  regular Tests:  depakote level on thursday 11/08/16 prior to AM depakote level. Other:  none  Yameli Delamater, MD 11/05/2016, 9:26 AM

## 2016-11-05 NOTE — Plan of Care (Signed)
Problem: [facility] Participation in Recreation Therapeutic Interventions Goal: STG-Patient will identify at least five coping skills for ** STG: Coping Skills - Patient will be able to identify at least 5 coping skills for anger by conclusion of recreation therapy tx  Outcome: Completed/Met Date Met: 11/05/16 Pt was able to identify coping skills for anger after the completion of coping skills and anger management recreation therapy sessions.  [name], LRT/CTRS

## 2016-11-05 NOTE — Progress Notes (Signed)
Patient discharged to lobby. Patient was stable and appreciative at that time. All papers and prescriptions were given and valuables returned. Verbal understanding expressed. Denies SI/HI and A/VH. Patient given opportunity to express concerns and ask questions.  

## 2016-11-05 NOTE — Tx Team (Signed)
Interdisciplinary Treatment and Diagnostic Plan Update  11/05/2016 Time of Session: 10:04 AM  Chadd Tollison MRN: [id number]  Principal Diagnosis: Paranoid schizophrenia, chronic condition with acute exacerbation ([place])  Secondary Diagnoses: Principal Problem:   Paranoid schizophrenia, chronic condition with acute exacerbation ([place]) Active Problems:   Diabetes mellitus ([place])   Essential hypertension   Current Medications:  Current Facility-Administered Medications  Medication Dose Route Frequency Provider Last Rate Last Dose  . acetaminophen (TYLENOL) tablet 650 mg  650 mg Oral Q6H PRN Laverle Hobby, PA-C   650 mg at 11/05/16 0304  . albuterol (PROVENTIL HFA;VENTOLIN HFA) 108 (90 Base) MCG/ACT inhaler 2 puff  2 puff Inhalation Q4H PRN Laverle Hobby, PA-C      . amLODipine (NORVASC) tablet 5 mg  5 mg Oral Daily Laverle Hobby, PA-C   5 mg at 11/05/16 1224  . atorvastatin (LIPITOR) tablet 20 mg  20 mg Oral Daily Laverle Hobby, PA-C   20 mg at 11/05/16 8250  . divalproex (DEPAKOTE) DR tablet 500 mg  500 mg Oral BID Saramma Eappen, MD      . feeding supplement (ENSURE ENLIVE) (ENSURE ENLIVE) liquid 237 mL  237 mL Oral BID BM Saramma Eappen, MD   237 mL at 11/03/16 1449  . gabapentin (NEURONTIN) capsule 600 mg  600 mg Oral TID Ursula Alert, MD   600 mg at 11/05/16 0800  . hydrochlorothiazide (HYDRODIURIL) tablet 25 mg  25 mg Oral Daily Laverle Hobby, PA-C   25 mg at 11/05/16 0370  . hydrOXYzine (ATARAX/VISTARIL) tablet 25 mg  25 mg Oral Q6H PRN Laverle Hobby, PA-C      . insulin aspart (novoLOG) injection 0-5 Units  0-5 Units Subcutaneous QHS Saramma Eappen, MD      . insulin aspart (novoLOG) injection 0-9 Units  0-9 Units Subcutaneous TID WC Ursula Alert, MD   1 Units at 11/05/16 0609  . linagliptin (TRADJENTA) tablet 5 mg  5 mg Oral Daily Laverle Hobby, PA-C   5 mg at 11/05/16 4888  . LORazepam (ATIVAN) tablet 1 mg  1 mg Oral Q6H PRN Ursula Alert, MD   1 mg at 11/04/16  1608   Or  . LORazepam (ATIVAN) injection 1 mg  1 mg Intramuscular Q6H PRN Ursula Alert, MD      . magnesium hydroxide (MILK OF MAGNESIA) suspension 30 mL  30 mL Oral Daily PRN Laverle Hobby, PA-C      . metFORMIN (GLUCOPHAGE) tablet 1,000 mg  1,000 mg Oral BID WC Laverle Hobby, PA-C   1,000 mg at 11/05/16 9169  . nicotine polacrilex (NICORETTE) gum 2 mg  2 mg Oral PRN Laverle Hobby, PA-C   2 mg at 11/05/16 0815  . OLANZapine (ZYPREXA) tablet 10 mg  10 mg Oral TID PRN Ursula Alert, MD   10 mg at 11/03/16 1610   Or  . OLANZapine (ZYPREXA) injection 10 mg  10 mg Intramuscular TID PRN Ursula Alert, MD      . pantoprazole (PROTONIX) EC tablet 40 mg  40 mg Oral Daily Laverle Hobby, PA-C   40 mg at 11/05/16 4503  . traZODone (DESYREL) tablet 100 mg  100 mg Oral QHS Ursula Alert, MD   100 mg at 11/04/16 2055  . trihexyphenidyl (ARTANE) tablet 2 mg  2 mg Oral QHS Ursula Alert, MD   2 mg at 11/04/16 2055  . ziprasidone (GEODON) capsule 80 mg  80 mg Oral BID WC Ursula Alert, MD  80 mg at 11/05/16 0813    PTA Medications: Prescriptions Prior to Admission  Medication Sig Dispense Refill Last Dose  . albuterol (PROVENTIL HFA;VENTOLIN HFA) 108 (90 Base) MCG/ACT inhaler Inhale 2 puffs into the lungs every 4 (four) hours as needed for wheezing or shortness of breath (cough). 1 Inhaler 0   . amLODipine (NORVASC) 5 MG tablet Take 5 mg by mouth daily.     05/12/2016 at Unknown time  . atorvastatin (LIPITOR) 20 MG tablet Take 20 mg by mouth daily. Reported on 05/01/2016   05/12/2016 at Unknown time  . gabapentin (NEURONTIN) 300 MG capsule Take 300 mg by mouth 3 (three) times daily.    05/12/2016 at Unknown time  . hydrochlorothiazide (HYDRODIURIL) 25 MG tablet Take 25 mg by mouth daily.   05/12/2016 at Unknown time  . indomethacin (INDOCIN) 25 MG capsule Take 25 mg by mouth 2 (two) times daily with a meal.   05/12/2016 at Unknown time  . linagliptin (TRADJENTA) 5 MG TABS tablet Take 5 mg by mouth  daily.   05/12/2016 at Unknown time  . metFORMIN (GLUCOPHAGE) 1000 MG tablet Take 1,000 mg by mouth 2 (two) times daily with a meal.     05/12/2016 at Unknown time  . pantoprazole (PROTONIX) 40 MG tablet Take 40 mg by mouth daily.   05/12/2016 at Unknown time  . risperidone (RISPERDAL) 4 MG tablet Take 4 mg by mouth daily.    05/12/2016 at Unknown time  . trihexyphenidyl (ARTANE) 2 MG tablet Take 2 mg by mouth daily.   05/12/2016 at Unknown time    Treatment Modalities: Medication Management, Group therapy, Case management,  1 to 1 session with clinician, Psychoeducation, Recreational therapy.   Physician Treatment Plan for Primary Diagnosis: Paranoid schizophrenia, chronic condition with acute exacerbation ([place]) Long Term Goal(s): Improvement in symptoms so as ready for discharge  Short Term Goals: Ability to identify and develop effective coping behaviors will improve  Medication Management: Evaluate patient's response, side effects, and tolerance of medication regimen.  Therapeutic Interventions: 1 to 1 sessions, Unit Group sessions and Medication administration.  Evaluation of Outcomes: Adequate for Discharge  Physician Treatment Plan for Secondary Diagnosis: Principal Problem:   Paranoid schizophrenia, chronic condition with acute exacerbation ([place]) Active Problems:   Diabetes mellitus ([place])   Essential hypertension   Long Term Goal(s): Improvement in symptoms so as ready for discharge  Short Term Goals: Compliance with prescribed medications will improve  Medication Management: Evaluate patient's response, side effects, and tolerance of medication regimen.  Therapeutic Interventions: 1 to 1 sessions, Unit Group sessions and Medication administration.  Evaluation of Outcomes: Adequate for Discharge   RN Treatment Plan for Primary Diagnosis: Paranoid schizophrenia, chronic condition with acute exacerbation ([place]) Long Term Goal(s): Knowledge of disease and therapeutic regimen  to maintain health will improve  Short Term Goals: Ability to verbalize feelings will improve and Compliance with prescribed medications will improve  Medication Management: RN will administer medications as ordered by provider, will assess and evaluate patient's response and provide education to patient for prescribed medication. RN will report any adverse and/or side effects to prescribing provider.  Therapeutic Interventions: 1 on 1 counseling sessions, Psychoeducation, Medication administration, Evaluate responses to treatment, Monitor vital signs and CBGs as ordered, Perform/monitor CIWA, COWS, AIMS and Fall Risk screenings as ordered, Perform wound care treatments as ordered.  Evaluation of Outcomes: Adequate for Discharge   LCSW Treatment Plan for Primary Diagnosis: Paranoid schizophrenia, chronic condition with acute exacerbation ([place]) Long Term Goal(s):  Safe transition to appropriate next level of care at discharge, Engage patient in therapeutic group addressing interpersonal concerns.  Short Term Goals: Engage patient in aftercare planning with referrals and resources  Therapeutic Interventions: Assess for all discharge needs, 1 to 1 time with Social worker, Explore available resources and support systems, Assess for adequacy in community support network, Educate family and significant other(s) on suicide prevention, Complete Psychosocial Assessment, Interpersonal group therapy.  Evaluation of Outcomes: Met   Progress in Treatment: Attending groups: Yes Participating in groups: Yes Taking medication as prescribed: Yes Toleration medication: Yes, no side effects reported at this time Family/Significant other contact made: Yes  ACT team Patient understands diagnosis: No Limited insight Discussing patient identified problems/goals with staff: Yes Medical problems stabilized or resolved: Yes Denies suicidal/homicidal ideation: Yes Issues/concerns per patient self-inventory:  None Other: N/A  New problem(s) identified: None identified at this time.   New Short Term/Long Term Goal(s): None identified at this time.   Discharge Plan or Barriers: return to shelter, unless ACT team finds housing in the meantime, follow up with [facility]  Reason for Continuation of Hospitalization:    Estimated Length of Stay: D/C today  Attendees: Patient: 11/05/2016  10:04 AM  Physician: Ursula Alert, MD 11/05/2016  10:04 AM  Nursing: Hedy Jacob, RN 11/05/2016  10:04 AM  RN Care Manager: Lars Pinks, RN 11/05/2016  10:04 AM  Social Worker: Ripley Fraise 11/05/2016  10:04 AM  Recreational Therapist: Marjette  11/05/2016  10:04 AM  Other: Norberto Sorenson 11/05/2016  10:04 AM  Other:  11/05/2016  10:04 AM    Scribe for Treatment Team:  Roque Lias LCSW 11/05/2016 10:04 AM

## 2016-11-05 NOTE — Progress Notes (Signed)
Recreation Therapy Notes  Date: 11/05/16 Time: 1000 Location: 500 Hall Dayroom  Group Topic: Self-Esteem  Goal Area(s) Addresses:  Patient will identify positive ways to increase self-esteem. Patient will verbalize benefit of increased self-esteem.  Behavioral Response: Observed  Intervention: Markers, construction paper  Activity: Personalized License Plate.  Patients were to get a piece of construction paper and create a license plate that highlights events, people or things that are important to them.  In doing so, patients should be able to identify things that make them unique.  Education:  Self-Esteem, Building control surveyorDischarge Planning.   Education Outcome: Acknowledges education/In group clarification offered/Needs additional education  Clinical Observations/Feedback: Pt was social with peers and observed his peers during activity.  Pt also had to be redirected because conversation became inappropriate.   Caroll RancherMarjette Shaunita Seney, LRT/CTRS       Caroll RancherLindsay, Arbor Cohen A 11/05/2016 11:38 AM

## 2016-11-05 NOTE — Progress Notes (Signed)
  [facility] :  Will you be returning to the same living situation after discharge:  No. At discharge, do you have transportation home?: Yes,  ACT team Do you have the ability to pay for your medications: Yes,  MCD  Release of information consent forms completed and in the chart;  Patient's signature needed at discharge.  Patient to Follow up at: Follow-up Information    [facility] Follow up.   Why:  Someone from the the ACT team will piick you up the day of d/c. Contact information: [address]  [place]  [[phone]] [phone]       [facility] [facility]. Schedule an appointment as soon as possible for a visit in 3 day(s).   Why:  Follow-up with depakote level Contact information: [address] [facility] [place] [phone]          Next level of care provider has access to [facility] Link:no  Safety Planning and Suicide Prevention discussed: Yes,  yes  Have you used any form of tobacco in the last 30 days? (Cigarettes, Smokeless Tobacco, Cigars, and/or Pipes): Yes  Has patient been referred to the Quitline?: Patient refused referral  Patient has been referred for addiction treatment: Yes  [name] 11/05/2016, 10:05 AM
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
## 2016-11-10 ENCOUNTER — Inpatient Hospital Stay (HOSPITAL_COMMUNITY)
Admission: EM | Admit: 2016-11-10 | Discharge: 2016-11-12 | DRG: 194 | Disposition: A | Discharge status: Home / Self Care | Payer: Medicaid Other | Attending: Internal Medicine | Admitting: Internal Medicine

## 2016-11-10 ENCOUNTER — Encounter (HOSPITAL_COMMUNITY): Payer: Self-pay | Admitting: Emergency Medicine

## 2016-11-10 ENCOUNTER — Emergency Department (HOSPITAL_COMMUNITY): Payer: Medicaid Other

## 2016-11-10 DIAGNOSIS — J181 Lobar pneumonia, unspecified organism: Principal | ICD-10-CM | POA: Diagnosis present

## 2016-11-10 DIAGNOSIS — F17210 Nicotine dependence, cigarettes, uncomplicated: Secondary | ICD-10-CM | POA: Diagnosis present

## 2016-11-10 DIAGNOSIS — Z79899 Other long term (current) drug therapy: Secondary | ICD-10-CM

## 2016-11-10 DIAGNOSIS — Z8249 Family history of ischemic heart disease and other diseases of the circulatory system: Secondary | ICD-10-CM

## 2016-11-10 DIAGNOSIS — F200 Paranoid schizophrenia: Secondary | ICD-10-CM | POA: Diagnosis present

## 2016-11-10 DIAGNOSIS — Z888 Allergy status to other drugs, medicaments and biological substances status: Secondary | ICD-10-CM

## 2016-11-10 DIAGNOSIS — R0602 Shortness of breath: Secondary | ICD-10-CM

## 2016-11-10 DIAGNOSIS — E1149 Type 2 diabetes mellitus with other diabetic neurological complication: Secondary | ICD-10-CM

## 2016-11-10 DIAGNOSIS — Y95 Nosocomial condition: Secondary | ICD-10-CM | POA: Diagnosis present

## 2016-11-10 DIAGNOSIS — E119 Type 2 diabetes mellitus without complications: Secondary | ICD-10-CM

## 2016-11-10 DIAGNOSIS — I10 Essential (primary) hypertension: Secondary | ICD-10-CM | POA: Diagnosis present

## 2016-11-10 DIAGNOSIS — E1165 Type 2 diabetes mellitus with hyperglycemia: Secondary | ICD-10-CM | POA: Diagnosis present

## 2016-11-10 DIAGNOSIS — E1140 Type 2 diabetes mellitus with diabetic neuropathy, unspecified: Secondary | ICD-10-CM | POA: Diagnosis present

## 2016-11-10 DIAGNOSIS — E871 Hypo-osmolality and hyponatremia: Secondary | ICD-10-CM | POA: Diagnosis present

## 2016-11-10 DIAGNOSIS — Z590 Homelessness: Secondary | ICD-10-CM

## 2016-11-10 DIAGNOSIS — J189 Pneumonia, unspecified organism: Secondary | ICD-10-CM

## 2016-11-10 DIAGNOSIS — E785 Hyperlipidemia, unspecified: Secondary | ICD-10-CM | POA: Diagnosis present

## 2016-11-10 DIAGNOSIS — A419 Sepsis, unspecified organism: Secondary | ICD-10-CM

## 2016-11-10 DIAGNOSIS — Z7984 Long term (current) use of oral hypoglycemic drugs: Secondary | ICD-10-CM

## 2016-11-10 DIAGNOSIS — B348 Other viral infections of unspecified site: Secondary | ICD-10-CM | POA: Diagnosis present

## 2016-11-10 LAB — CBC WITH DIFFERENTIAL/PLATELET
Basophils Absolute: 0 10*3/uL (ref 0.0–0.1)
Basophils Relative: 0 %
EOS ABS: 0.4 10*3/uL (ref 0.0–0.7)
Eosinophils Relative: 3 %
HEMATOCRIT: 41.4 % (ref 39.0–52.0)
HEMOGLOBIN: 13.7 g/dL (ref 13.0–17.0)
LYMPHS ABS: 1.2 10*3/uL (ref 0.7–4.0)
LYMPHS PCT: 9 %
MCH: 29.3 pg (ref 26.0–34.0)
MCHC: 33.1 g/dL (ref 30.0–36.0)
MCV: 88.7 fL (ref 78.0–100.0)
MONOS PCT: 7 %
Monocytes Absolute: 1 10*3/uL (ref 0.1–1.0)
NEUTROS ABS: 11.2 10*3/uL — AB (ref 1.7–7.7)
NEUTROS PCT: 81 %
Platelets: 173 10*3/uL (ref 150–400)
RBC: 4.67 MIL/uL (ref 4.22–5.81)
RDW: 14.5 % (ref 11.5–15.5)
WBC: 13.8 10*3/uL — AB (ref 4.0–10.5)

## 2016-11-10 LAB — BASIC METABOLIC PANEL
Anion gap: 8 (ref 5–15)
BUN: 14 mg/dL (ref 6–20)
CHLORIDE: 102 mmol/L (ref 101–111)
CO2: 24 mmol/L (ref 22–32)
CREATININE: 1.06 mg/dL (ref 0.61–1.24)
Calcium: 8.9 mg/dL (ref 8.9–10.3)
GFR calc Af Amer: >60 mL/min (ref >60)
GFR calc non Af Amer: >60 mL/min (ref >60)
Glucose, Bld: 371 mg/dL — ABNORMAL HIGH (ref 65–99)
POTASSIUM: 4.1 mmol/L (ref 3.5–5.1)
SODIUM: 134 mmol/L — AB (ref 135–145)

## 2016-11-10 MED ORDER — INSULIN ASPART 100 UNIT/ML ~~LOC~~ SOLN
0.0000 [IU] | Freq: Every day | SUBCUTANEOUS | Status: DC
  Administered 2016-11-11: 5 [IU] via SUBCUTANEOUS

## 2016-11-10 MED ORDER — SODIUM CHLORIDE 0.9 % IV BOLUS (SEPSIS)
1000.0000 mL | Freq: Once | INTRAVENOUS | Status: AC
  Administered 2016-11-10: 1000 mL via INTRAVENOUS

## 2016-11-10 MED ORDER — NICOTINE 14 MG/24HR TD PT24
14.0000 mg | MEDICATED_PATCH | Freq: Every day | TRANSDERMAL | Status: DC
  Administered 2016-11-11: 14 mg via TRANSDERMAL
  Filled 2016-11-10: qty 1

## 2016-11-10 MED ORDER — INSULIN ASPART 100 UNIT/ML ~~LOC~~ SOLN
0.0000 [IU] | Freq: Three times a day (TID) | SUBCUTANEOUS | Status: DC
  Administered 2016-11-11: 8 [IU] via SUBCUTANEOUS
  Administered 2016-11-11: 3 [IU] via SUBCUTANEOUS
  Administered 2016-11-12: 5 [IU] via SUBCUTANEOUS

## 2016-11-10 MED ORDER — SODIUM CHLORIDE 0.9% FLUSH: 3.0000 mL | INTRAVENOUS | Status: DC | PRN

## 2016-11-10 MED ORDER — ACETAMINOPHEN 500 MG PO TABS
1000.0000 mg | ORAL_TABLET | Freq: Once | ORAL | Status: AC
  Administered 2016-11-10: 1000 mg via ORAL
  Filled 2016-11-10: qty 2

## 2016-11-10 MED ORDER — VANCOMYCIN HCL IN DEXTROSE 750-5 MG/150ML-% IV SOLN
750.0000 mg | Freq: Three times a day (TID) | INTRAVENOUS | Status: DC
  Administered 2016-11-11 (×2): 750 mg via INTRAVENOUS
  Filled 2016-11-10 (×3): qty 150

## 2016-11-10 MED ORDER — SODIUM CHLORIDE 0.9% FLUSH
3.0000 mL | Freq: Two times a day (BID) | INTRAVENOUS | Status: DC
  Administered 2016-11-10 – 2016-11-11 (×2): 3 mL via INTRAVENOUS

## 2016-11-10 MED ORDER — DEXTROSE 5 % IV SOLN
1.0000 g | Freq: Three times a day (TID) | INTRAVENOUS | Status: DC
  Administered 2016-11-11 (×3): 1 g via INTRAVENOUS
  Filled 2016-11-10 (×5): qty 1

## 2016-11-10 MED ORDER — DEXTROSE 5 % IV SOLN: 1.0000 g | Freq: Three times a day (TID) | INTRAVENOUS | Status: DC

## 2016-11-10 MED ORDER — IPRATROPIUM-ALBUTEROL 0.5-2.5 (3) MG/3ML IN SOLN
RESPIRATORY_TRACT | Status: AC
  Filled 2016-11-10: qty 3

## 2016-11-10 MED ORDER — SODIUM CHLORIDE 0.9 % IV SOLN: 250.0000 mL | INTRAVENOUS | Status: DC | PRN

## 2016-11-10 MED ORDER — VANCOMYCIN HCL IN DEXTROSE 1-5 GM/200ML-% IV SOLN
1000.0000 mg | Freq: Once | INTRAVENOUS | Status: AC
  Administered 2016-11-10: 1000 mg via INTRAVENOUS
  Filled 2016-11-10: qty 200

## 2016-11-10 MED ORDER — PIPERACILLIN-TAZOBACTAM 3.375 G IVPB
3.3750 g | Freq: Once | INTRAVENOUS | Status: AC
  Administered 2016-11-10: 3.375 g via INTRAVENOUS
  Filled 2016-11-10: qty 50

## 2016-11-10 MED ORDER — ENOXAPARIN SODIUM 40 MG/0.4ML ~~LOC~~ SOLN: 40.0000 mg | SUBCUTANEOUS | Status: DC

## 2016-11-10 MED ORDER — IPRATROPIUM-ALBUTEROL 0.5-2.5 (3) MG/3ML IN SOLN
3.0000 mL | Freq: Once | RESPIRATORY_TRACT | Status: AC
  Administered 2016-11-10: 3 mL via RESPIRATORY_TRACT
  Filled 2016-11-10: qty 3

## 2016-11-10 NOTE — ED Notes (Signed)
Patient transported to X-ray 

## 2016-11-10 NOTE — ED Notes (Signed)
Attempted to give pt breathing treatment.  Pt does not want any nursing care from me and refuses (he is not refusing the medications or treatments, he is refusing for me to do it).  Charge RN aware

## 2016-11-10 NOTE — Progress Notes (Signed)
Pharmacy Antibiotic Note  [name] is a 55 y.o. male admitted on 11/10/2016 with pneumonia.  He presents to ED from [facility] (homeless) with fever, cough, sputum production.  Pharmacy has been consulted for Vancomycin and renal antibiotic dosing.  Plan:  Cefepime 1g IV q8h  Vancomycin 1g x1 then 750 mg IV q8h.  Measure Vanc trough at steady state.  Follow up renal fxn, culture results, and clinical course.  Weight: 168 lb (76.2 kg)  Temp (24hrs), Avg:99.1 F (37.3 C), Min:98.2 F (36.8 C), Max:99.9 F (37.7 C)   Recent Labs Lab 11/10/16 1600  WBC 13.8*  CREATININE 1.06    Estimated Creatinine Clearance: 84.9 mL/min (by C-G formula based on SCr of 1.06 mg/dL).    Allergies  Allergen Reactions  . Benadryl [Diphenhydramine Hcl]   . Diphenhydramine     GI upset, paradoxic agitation  . Haldol [Haloperidol Lactate]   . Haldol [Haloperidol]     "makes me tremble, bite my tongue"  . Lisinopril Swelling  . Thorazine [Chlorpromazine]     Tremble, bite my tongue  . Thorazine [Chlorpromazine]     Antimicrobials this admission: 11/18 Vancomycin >>  11/18 Cefepime >>   Dose adjustments this admission:   Microbiology results: 11/18 BCx:  Thank you for allowing pharmacy to be a part of this patient's care.  Lynann Beaverhristine Herschel Fleagle PharmD, BCPS Pager [phone] 11/10/2016 6:02 PM

## 2016-11-10 NOTE — ED Notes (Signed)
A voicemail was left for the Child psychotherapistsocial worker. She will call back

## 2016-11-10 NOTE — ED Triage Notes (Signed)
Pt from [facility] via EMS- Pt reports cough with fever x2 days. Pt describes productive yellow-green mucous. Pt received 10 alb/.05 atrovent via neb en route. EMS reports rhonchi bilaterally. Pt ws ambulatory to EMS truck. Pt is A&O and in NAD

## 2016-11-10 NOTE — ED Notes (Signed)
Patient refused rectal temp

## 2016-11-10 NOTE — Progress Notes (Signed)
CSW contacted by patient's RN in regards to assistance with helping patient obtain medication. CSW informed patient's RN that care management handles assisting patients with medication. Patient's RN reported that she contacted care management and did not receive an answer. CSW agreed to contact care management in attempt to reach someone, CSW was unable to reach care management. CSW informed patient's RN.

## 2016-11-10 NOTE — ED Notes (Signed)
Pt becomes very upset, screaming at me.  He is upset about being offered a sandwich and is wanting foodtray, explained to him that we will try to order him a tray.  Pt screaming at me to leave the room as he only wants a "brown or asian nurse"  Attempted to speak with pt which escalated him.

## 2016-11-10 NOTE — ED Notes (Signed)
Went to pt room to collect labs -  Pt would not acknowledge me at all.  Kept talking on phone and eating his dinner.  RN and charge RN are aware.

## 2016-11-10 NOTE — ED Provider Notes (Signed)
[facility]-EMERGENCY DEPT Provider Note   CSN: [id number] Arrival date & time: 11/10/16  1529     History   Chief Complaint Chief Complaint  Patient presents with  . Fever  . Cough    HPI [name] is a 55 y.o. male.  HPI 55 year old male who presents with fever and cough. He has history of homelessness, diabetes, hypertension, hyperlipidemia, and paranoid schizophrenia. Was recently discharged from inpatient psychiatric hospitalization this month. States one day of productive cough, shortness of breath, fevers and chills with night sweats. No known sick contacts. No nausea, vomiting, diarrhea or chest pain.   Past Medical History:  Diagnosis Date  . Diabetes mellitus   . Diabetic neuropathy (HCC)   . High cholesterol   . Hypertension   . Schizophrenia [facility](HCC)     Patient Active Problem List   Diagnosis Date Noted  . Diabetes mellitus (HCC) 10/30/2016  . Essential hypertension 10/30/2016  . Paranoid schizophrenia, chronic condition with acute exacerbation (HCC) 10/29/2016    Past Surgical History:  Procedure Laterality Date  . TONSILLECTOMY         Home Medications    Prior to Admission medications   Medication Sig Start Date End Date Taking? Authorizing Provider  albuterol (PROVENTIL HFA;VENTOLIN HFA) 108 (90 Base) MCG/ACT inhaler Inhale 2 puffs into the lungs every 4 (four) hours as needed for wheezing or shortness of breath (cough). 05/13/16   Mercedes Camprubi-Soms, PA-C  amLODipine (NORVASC) 5 MG tablet Take 5 mg by mouth daily.      Historical Provider, MD  atorvastatin (LIPITOR) 20 MG tablet Take 20 mg by mouth daily. Reported on 05/01/2016    Historical Provider, MD  divalproex (DEPAKOTE) 500 MG DR tablet Take 1 tablet (500 mg total) by mouth 2 (two) times daily. 11/05/16   Beau FannyJohn C Withrow, FNP  gabapentin (NEURONTIN) 300 MG capsule Take 2 capsules (600 mg total) by mouth 3 (three) times daily. 11/05/16   Oneta Rackanika N Lewis, NP  hydrochlorothiazide  (HYDRODIURIL) 25 MG tablet Take 25 mg by mouth daily.    Historical Provider, MD  hydrOXYzine (ATARAX/VISTARIL) 25 MG tablet Take 1 tablet (25 mg total) by mouth every 6 (six) hours as needed for anxiety. 11/05/16   Oneta Rackanika N Lewis, NP  linagliptin (TRADJENTA) 5 MG TABS tablet Take 5 mg by mouth daily.    Historical Provider, MD  metFORMIN (GLUCOPHAGE) 1000 MG tablet Take 1,000 mg by mouth 2 (two) times daily with a meal.      Historical Provider, MD  nicotine polacrilex (NICORETTE) 2 MG gum Take 1 each (2 mg total) by mouth as needed for smoking cessation. 11/05/16   Oneta Rackanika N Lewis, NP  pantoprazole (PROTONIX) 40 MG tablet Take 40 mg by mouth daily.    Historical Provider, MD  traZODone (DESYREL) 100 MG tablet Take 1 tablet (100 mg total) by mouth at bedtime. 11/05/16   Oneta Rackanika N Lewis, NP  trihexyphenidyl (ARTANE) 2 MG tablet Take 1 tablet (2 mg total) by mouth at bedtime. 11/05/16   Oneta Rackanika N Lewis, NP  ziprasidone (GEODON) 80 MG capsule Take 1 capsule (80 mg total) by mouth 2 (two) times daily with a meal. 11/05/16   Oneta Rackanika N Lewis, NP    Family History Family History  Problem Relation Age of Onset  . Hypertension Mother   . Cancer Father   . Alcoholism Other     Social History Social History  Substance Use Topics  . Smoking status: Current Every Day Smoker  Packs/day: 0.50    Types: Cigarettes  . Smokeless tobacco: Never Used  . Alcohol use No     Allergies   Benadryl [diphenhydramine hcl]; Diphenhydramine; Haldol [haloperidol lactate]; Haldol [haloperidol]; Lisinopril; Thorazine [chlorpromazine]; and Thorazine [chlorpromazine]   Review of Systems Review of Systems 10/14 systems reviewed and are negative other than those stated in the HPI   Physical Exam Updated Vital Signs BP 164/80 (BP Location: Right Arm)   Pulse 92   Temp (S) 99.9 F (37.7 C) (Oral) Comment: PT REFUSES RECTAL TEMP  Resp 22   Wt 168 lb (76.2 kg)   SpO2 100%   BMI 23.10 kg/m   Physical  Exam Physical Exam  Nursing note and vitals reviewed. Constitutional: Well developed, well nourished, non-toxic, and in no acute distress Head: Normocephalic and atraumatic.  Mouth/Throat: Oropharynx is clear and moist.  Neck: Normal range of motion. Neck supple.  Cardiovascular: intermittent tachycardic rate and regular rhythm.   Pulmonary/Chest: Effort normal. No conversational dyspnea. Rhonchi in low lungs w/ bronchospastic cough.  Abdominal: Soft. There is no tenderness. There is no rebound and no guarding.  Musculoskeletal: Normal range of motion.  Neurological: Alert, no facial droop, fluent speech, moves all extremities symmetrically Skin: Skin is warm and dry.  Psychiatric: Cooperative   ED Treatments / Results  Labs (all labs ordered are listed, but only abnormal results are displayed) Labs Reviewed  CBC WITH DIFFERENTIAL/PLATELET - Abnormal; Notable for the following:       Result Value   WBC 13.8 (*)    Neutro Abs 11.2 (*)    All other components within normal limits  BASIC METABOLIC PANEL - Abnormal; Notable for the following:    Sodium 134 (*)    Glucose, Bld 371 (*)    All other components within normal limits    EKG  EKG Interpretation None       Radiology Dg Chest 2 View  Result Date: 11/10/2016 CLINICAL DATA:  Cough for 2 days. EXAM: CHEST  2 VIEW COMPARISON:  06/07/2015 and prior chest radiographs FINDINGS: The cardiomediastinal silhouette is unremarkable. Mild streaky/airspace opacities within the left lower lobe likely represents early or mild pneumonia. There is no evidence of pulmonary edema, suspicious pulmonary nodule/mass, pleural effusion, or pneumothorax. No acute bony abnormalities are identified. IMPRESSION: Probable left lower lobe pneumonia. Radiographic follow-up to resolution is recommended. Electronically Signed   By: Harmon Pier M.D.   On: 11/10/2016 16:38    Procedures Procedures (including critical care time)  Medications Ordered in  ED Medications  vancomycin (VANCOCIN) IVPB 1000 mg/200 mL premix (not administered)  piperacillin-tazobactam (ZOSYN) IVPB 3.375 g (3.375 g Intravenous New Bag/Given 11/10/16 1714)  ipratropium-albuterol (DUONEB) 0.5-2.5 (3) MG/3ML nebulizer solution (  Not Given 11/10/16 1734)  acetaminophen (TYLENOL) tablet 1,000 mg (1,000 mg Oral Given 11/10/16 1648)  sodium chloride 0.9 % bolus 1,000 mL (1,000 mLs Intravenous New Bag/Given 11/10/16 1657)  ipratropium-albuterol (DUONEB) 0.5-2.5 (3) MG/3ML nebulizer solution 3 mL (3 mLs Nebulization Given 11/10/16 1731)     Initial Impression / Assessment and Plan / ED Course  I have reviewed the triage vital signs and the nursing notes.  Pertinent labs & imaging results that were available during my care of the patient were reviewed by me and considered in my medical decision making (see chart for details).  Clinical Course     55 year old male with history of diabetes and paranoid schizophrenia who presents with 1 day of subjective fevers, productive cough, and shortness of breath. Meeting  some sepsis criteria as with low-grade temperature of 99.9 and tachycardia. With leukocytosis of 13.  Is on room air with normal work of breathing and normal oxygenation. Rhonchorous breath sounds and bronchospastic cough noted on lung exam. Chest x-ray with developing left lower lobe pneumonia. Recent hospitalization for psychiatric treatment, will be treated as healthcare associated pneumonia. Received IV fluids, vancomycin and Zosyn. I given homelessness, inability to afford medications, with evidence with sepsis, will admit to hospitalist service.  Final Clinical Impressions(s) / ED Diagnoses   Final diagnoses:  SOB (shortness of breath)  Lobar pneumonia (HCC)  HCAP (healthcare-associated pneumonia)    New Prescriptions New Prescriptions   No medications on file     Lavera Guiseana Duo Liu, MD 11/10/16 1750

## 2016-11-10 NOTE — ED Notes (Signed)
RT called from breathing treamtment

## 2016-11-10 NOTE — H&P (Addendum)
History and Physical    [name] ZOX:[id number] DOB: June 10, 1961 DOA: 11/10/2016    PCP: Isabella StallingNDIEGO,RICHARD M, MD  Patient coming from: homeless- was at [facility]   Chief Complaint: cough, fever  HPI: [name] is a 55 y.o. male with medical history significant of NIDDM, diabetic neuropathy, HTN, schizophrenia who present for cough and fever for 2 days. He is coughing up yellow/ green mucous. No chest pain. Has a runny and stuffy nose, watery eyes and a headache. No sore throat.  He had [facility] from 11/[facility]- 11/13 for an acute exacerbation of schizophrenia.   ED Course: temp 99.9, HR 102, BP 164/80, not hypoxic Na+ 134, WBC 13.8, Glucose 371 CXR > probable LLL pneumonia  Review of Systems:  Has neuropathy in his feet.  All other systems reviewed and apart from HPI, are negative.  Past Medical History:  Diagnosis Date  . Diabetes mellitus   . Diabetic neuropathy (HCC)   . High cholesterol   . Hypertension   . Schizophrenia [facility](HCC)     Past Surgical History:  Procedure Laterality Date  . TONSILLECTOMY      Social History:   reports that he has been smoking Cigarettes.  He has been smoking about 0.50 packs per day. He has never used smokeless tobacco. He reports that he does not drink alcohol or use drugs.  Allergies  Allergen Reactions  . Benadryl [Diphenhydramine Hcl]   . Diphenhydramine     GI upset, paradoxic agitation  . Haldol [Haloperidol Lactate]   . Haldol [Haloperidol]     "makes me tremble, bite my tongue"  . Lisinopril Swelling  . Thorazine [Chlorpromazine]     Tremble, bite my tongue  . Thorazine [Chlorpromazine]     Family History  Problem Relation Age of Onset  . Hypertension Mother   . Cancer Father   . Alcoholism Other      Prior to Admission medications   Medication Sig Start Date End Date Taking? Authorizing Provider  albuterol (PROVENTIL HFA;VENTOLIN HFA) 108 (90 Base) MCG/ACT inhaler Inhale 2 puffs into the  lungs every 4 (four) hours as needed for wheezing or shortness of breath (cough). 05/13/16   Mercedes Camprubi-Soms, PA-C  amLODipine (NORVASC) 5 MG tablet Take 5 mg by mouth daily.      Historical Provider, MD  atorvastatin (LIPITOR) 20 MG tablet Take 20 mg by mouth daily. Reported on 05/01/2016    Historical Provider, MD  divalproex (DEPAKOTE) 500 MG DR tablet Take 1 tablet (500 mg total) by mouth 2 (two) times daily. 11/05/16   Beau FannyJohn C Withrow, FNP  gabapentin (NEURONTIN) 300 MG capsule Take 2 capsules (600 mg total) by mouth 3 (three) times daily. 11/05/16   Oneta Rackanika N Lewis, NP  hydrochlorothiazide (HYDRODIURIL) 25 MG tablet Take 25 mg by mouth daily.    Historical Provider, MD  hydrOXYzine (ATARAX/VISTARIL) 25 MG tablet Take 1 tablet (25 mg total) by mouth every 6 (six) hours as needed for anxiety. 11/05/16   Oneta Rackanika N Lewis, NP  linagliptin (TRADJENTA) 5 MG TABS tablet Take 5 mg by mouth daily.    Historical Provider, MD  metFORMIN (GLUCOPHAGE) 1000 MG tablet Take 1,000 mg by mouth 2 (two) times daily with a meal.      Historical Provider, MD  nicotine polacrilex (NICORETTE) 2 MG gum Take 1 each (2 mg total) by mouth as needed for smoking cessation. 11/05/16   Oneta Rackanika N Lewis, NP  pantoprazole (PROTONIX) 40 MG tablet Take 40 mg  by mouth daily.    Historical Provider, MD  traZODone (DESYREL) 100 MG tablet Take 1 tablet (100 mg total) by mouth at bedtime. 11/05/16   Oneta Rackanika N Lewis, NP  trihexyphenidyl (ARTANE) 2 MG tablet Take 1 tablet (2 mg total) by mouth at bedtime. 11/05/16   Oneta Rackanika N Lewis, NP  ziprasidone (GEODON) 80 MG capsule Take 1 capsule (80 mg total) by mouth 2 (two) times daily with a meal. 11/05/16   Oneta Rackanika N Lewis, NP    Physical Exam: Vitals:   11/10/16 1542 11/10/16 1549 11/10/16 1700 11/10/16 1800  BP:   154/85 142/74  Pulse:      Resp:   26 20  Temp:  (S) 99.9 F (37.7 C)    TempSrc:  Oral    SpO2:      Weight: 76.2 kg (168 lb)         Constitutional: NAD, calm,  comfortable Eyes: PERTLA, lids and conjunctivae normal ENMT: Mucous membranes are moist. Posterior pharynx clear of any exudate or lesions. Normal dentition.  Neck: normal, supple, no masses, no thyromegaly Respiratory: coarse breath sounds in left lower lung field,  no wheezing, no crackles. Normal respiratory effort. No accessory muscle use.  Cardiovascular: S1 & S2 heard, regular rate and rhythm, no murmurs / rubs / gallops. No extremity edema. 2+ pedal pulses. No carotid bruits.  Abdomen: No distension, no tenderness, no masses palpated. No hepatosplenomegaly. Bowel sounds normal.  Musculoskeletal: no clubbing / cyanosis. No joint deformity upper and lower extremities. Good ROM, no contractures. Normal muscle tone.  Skin: no rashes, lesions, ulcers. No induration Neurologic: CN 2-12 grossly intact. Sensation intact, DTR normal. Strength 5/5 in all 4 limbs.  Psychiatric: Normal judgment and insight. Alert and oriented x 3. Normal mood.     Labs on Admission: I have personally reviewed following labs and imaging studies  CBC:  Recent Labs Lab 11/10/16 1600  WBC 13.8*  NEUTROABS 11.2*  HGB 13.7  HCT 41.4  MCV 88.7  PLT 173   Basic Metabolic Panel:  Recent Labs Lab 11/10/16 1600  NA 134*  K 4.1  CL 102  CO2 24  GLUCOSE 371*  BUN 14  CREATININE 1.06  CALCIUM 8.9   GFR: Estimated Creatinine Clearance: 84.9 mL/min (by C-G formula based on SCr of 1.06 mg/dL). Liver Function Tests: No results for input(s): AST, ALT, ALKPHOS, BILITOT, PROT, ALBUMIN in the last 168 hours. No results for input(s): LIPASE, AMYLASE in the last 168 hours. No results for input(s): AMMONIA in the last 168 hours. Coagulation Profile: No results for input(s): INR, PROTIME in the last 168 hours. Cardiac Enzymes: No results for input(s): CKTOTAL, CKMB, CKMBINDEX, TROPONINI in the last 168 hours. BNP (last 3 results) No results for input(s): PROBNP in the last 8760 hours. HbA1C: No results for  input(s): HGBA1C in the last 72 hours. CBG:  Recent Labs Lab 11/04/16 1138 11/04/16 1708 11/04/16 2045 11/05/16 0600 11/05/16 1208  GLUCAP 104* 153* 150* 148* 138*   Lipid Profile: No results for input(s): CHOL, HDL, LDLCALC, TRIG, CHOLHDL, LDLDIRECT in the last 72 hours. Thyroid Function Tests: No results for input(s): TSH, T4TOTAL, FREET4, T3FREE, THYROIDAB in the last 72 hours. Anemia Panel: No results for input(s): VITAMINB12, FOLATE, FERRITIN, TIBC, IRON, RETICCTPCT in the last 72 hours. Urine analysis:    Component Value Date/Time   COLORURINE YELLOW 10/30/2016 1202   APPEARANCEUR CLEAR 10/30/2016 1202   LABSPEC 1.008 10/30/2016 1202   PHURINE 5.5 10/30/2016 1202   GLUCOSEU  500 (A) 10/30/2016 1202   HGBUR NEGATIVE 10/30/2016 1202   BILIRUBINUR NEGATIVE 10/30/2016 1202   KETONESUR NEGATIVE 10/30/2016 1202   PROTEINUR NEGATIVE 10/30/2016 1202   UROBILINOGEN 0.2 11/09/2010 2330   NITRITE NEGATIVE 10/30/2016 1202   LEUKOCYTESUR NEGATIVE 10/30/2016 1202   Sepsis Labs: @LABRCNTIP (procalcitonin:4,lacticidven:4) )No results found for this or any previous visit (from the past 240 hour(s)).   Radiological Exams on Admission: Dg Chest 2 View  Result Date: 11/10/2016 CLINICAL DATA:  Cough for 2 days. EXAM: CHEST  2 VIEW COMPARISON:  06/07/2015 and prior chest radiographs FINDINGS: The cardiomediastinal silhouette is unremarkable. Mild streaky/airspace opacities within the left lower lobe likely represents early or mild pneumonia. There is no evidence of pulmonary edema, suspicious pulmonary nodule/mass, pleural effusion, or pneumothorax. No acute bony abnormalities are identified. IMPRESSION: Probable left lower lobe pneumonia. Radiographic follow-up to resolution is recommended. Electronically Signed   By: Harmon Pier M.D.   On: 11/10/2016 16:38       Assessment/Plan Principal Problem:   Lobar pneumonia/ HCAP due to recent psych hospitalization/ sepsis - with HR> 100 and  WBC count 13.8 - received  1 L NS in ER - Vanc/Cefepime- f/u MRSA PCR, strep pneumo antigen, HIV, resp viral panel and Infleunza PCR - Nebs and O2 PRN - Tussionex, Nasonex for nasal symptoms  Active Problems:   Essential hypertension - Amlodipine, HCTZ  Mild hyponatremia - cont hCTZ for now    Non-insulin treated type 2 diabetes mellitus with hyperglycemia - HbA1c 9.4 on 10/31/16 which has been about the same in our computer system since 2010 - moderate intensity ISS - hold Trajenta and Metformin    Chronic paranoid schizophrenia  - Depakote, Trazodone, Artane, Geodon, PRN Hydrozyzine  Homeless - will request social work consult  Cigarette smoker - Nicotine patch   DVT prophylaxis: lovenox   Code Status: full code  Family Communication:   Disposition Plan: discharge in 1-2 day-  Consults called: none  Admission status: observation    Daequan Kozma MD [facility] Pager: [email or web address] Password [email or web address] 7PM-7AM, please contact night-coverage   11/10/2016, 6:17 PM

## 2016-11-10 NOTE — ED Notes (Signed)
Call to [phone] 

## 2016-11-11 DIAGNOSIS — E871 Hypo-osmolality and hyponatremia: Secondary | ICD-10-CM | POA: Diagnosis present

## 2016-11-11 DIAGNOSIS — Z79899 Other long term (current) drug therapy: Secondary | ICD-10-CM | POA: Diagnosis not present

## 2016-11-11 DIAGNOSIS — F17210 Nicotine dependence, cigarettes, uncomplicated: Secondary | ICD-10-CM | POA: Diagnosis present

## 2016-11-11 DIAGNOSIS — R05 Cough: Secondary | ICD-10-CM | POA: Diagnosis present

## 2016-11-11 DIAGNOSIS — Z888 Allergy status to other drugs, medicaments and biological substances status: Secondary | ICD-10-CM | POA: Diagnosis not present

## 2016-11-11 DIAGNOSIS — E785 Hyperlipidemia, unspecified: Secondary | ICD-10-CM | POA: Diagnosis present

## 2016-11-11 DIAGNOSIS — Z8249 Family history of ischemic heart disease and other diseases of the circulatory system: Secondary | ICD-10-CM | POA: Diagnosis not present

## 2016-11-11 DIAGNOSIS — F200 Paranoid schizophrenia: Secondary | ICD-10-CM | POA: Diagnosis present

## 2016-11-11 DIAGNOSIS — Z7984 Long term (current) use of oral hypoglycemic drugs: Secondary | ICD-10-CM | POA: Diagnosis not present

## 2016-11-11 DIAGNOSIS — J181 Lobar pneumonia, unspecified organism: Secondary | ICD-10-CM | POA: Diagnosis not present

## 2016-11-11 DIAGNOSIS — B348 Other viral infections of unspecified site: Secondary | ICD-10-CM | POA: Diagnosis present

## 2016-11-11 DIAGNOSIS — E1165 Type 2 diabetes mellitus with hyperglycemia: Secondary | ICD-10-CM | POA: Diagnosis present

## 2016-11-11 DIAGNOSIS — E119 Type 2 diabetes mellitus without complications: Secondary | ICD-10-CM | POA: Diagnosis not present

## 2016-11-11 DIAGNOSIS — Y95 Nosocomial condition: Secondary | ICD-10-CM | POA: Diagnosis present

## 2016-11-11 DIAGNOSIS — Z590 Homelessness: Secondary | ICD-10-CM | POA: Diagnosis not present

## 2016-11-11 DIAGNOSIS — E1140 Type 2 diabetes mellitus with diabetic neuropathy, unspecified: Secondary | ICD-10-CM | POA: Diagnosis present

## 2016-11-11 DIAGNOSIS — I10 Essential (primary) hypertension: Secondary | ICD-10-CM | POA: Diagnosis present

## 2016-11-11 LAB — RESPIRATORY PANEL BY PCR
ADENOVIRUS-RVPPCR: NOT DETECTED
Bordetella pertussis: NOT DETECTED
CORONAVIRUS 229E-RVPPCR: NOT DETECTED
CORONAVIRUS HKU1-RVPPCR: NOT DETECTED
CORONAVIRUS OC43-RVPPCR: NOT DETECTED
Chlamydophila pneumoniae: NOT DETECTED
Coronavirus NL63: NOT DETECTED
INFLUENZA B-RVPPCR: NOT DETECTED
Influenza A: NOT DETECTED
METAPNEUMOVIRUS-RVPPCR: NOT DETECTED
MYCOPLASMA PNEUMONIAE-RVPPCR: NOT DETECTED
PARAINFLUENZA VIRUS 1-RVPPCR: NOT DETECTED
PARAINFLUENZA VIRUS 2-RVPPCR: NOT DETECTED
Parainfluenza Virus 3: NOT DETECTED
Parainfluenza Virus 4: NOT DETECTED
RESPIRATORY SYNCYTIAL VIRUS-RVPPCR: NOT DETECTED
Rhinovirus / Enterovirus: DETECTED — AB

## 2016-11-11 LAB — GLUCOSE, CAPILLARY
Glucose-Capillary: 165 mg/dL — ABNORMAL HIGH (ref 65–99)
Glucose-Capillary: 165 mg/dL — ABNORMAL HIGH (ref 65–99)
Glucose-Capillary: 251 mg/dL — ABNORMAL HIGH (ref 65–99)
Glucose-Capillary: 263 mg/dL — ABNORMAL HIGH (ref 65–99)
Glucose-Capillary: 356 mg/dL — ABNORMAL HIGH (ref 65–99)

## 2016-11-11 LAB — INFLUENZA PANEL BY PCR (TYPE A & B)
INFLAPCR: NEGATIVE
INFLBPCR: NEGATIVE

## 2016-11-11 LAB — MRSA PCR SCREENING: MRSA by PCR: NEGATIVE

## 2016-11-11 MED ORDER — ALBUTEROL SULFATE HFA 108 (90 BASE) MCG/ACT IN AERS: 2.0000 | INHALATION_SPRAY | RESPIRATORY_TRACT | Status: DC | PRN

## 2016-11-11 MED ORDER — DIVALPROEX SODIUM 250 MG PO DR TAB
500.0000 mg | DELAYED_RELEASE_TABLET | Freq: Two times a day (BID) | ORAL | Status: DC
  Administered 2016-11-11 – 2016-11-12 (×2): 500 mg via ORAL
  Filled 2016-11-11 (×3): qty 2

## 2016-11-11 MED ORDER — TRIHEXYPHENIDYL HCL 2 MG PO TABS
2.0000 mg | ORAL_TABLET | Freq: Every day | ORAL | Status: DC
  Administered 2016-11-11: 2 mg via ORAL
  Filled 2016-11-11: qty 1

## 2016-11-11 MED ORDER — ATORVASTATIN CALCIUM 10 MG PO TABS
20.0000 mg | ORAL_TABLET | Freq: Every day | ORAL | Status: DC
  Administered 2016-11-12: 20 mg via ORAL
  Filled 2016-11-11: qty 2

## 2016-11-11 MED ORDER — TRAZODONE HCL 100 MG PO TABS
100.0000 mg | ORAL_TABLET | Freq: Every day | ORAL | Status: DC
  Administered 2016-11-11: 100 mg via ORAL
  Filled 2016-11-11: qty 1

## 2016-11-11 MED ORDER — LORAZEPAM 2 MG/ML IJ SOLN: 1.0000 mg | INTRAMUSCULAR | Status: DC | PRN

## 2016-11-11 MED ORDER — HYDROCODONE-ACETAMINOPHEN 5-325 MG PO TABS
1.0000 | ORAL_TABLET | ORAL | Status: DC | PRN
  Administered 2016-11-11: 2 via ORAL
  Filled 2016-11-11: qty 2

## 2016-11-11 MED ORDER — ACETAMINOPHEN 325 MG PO TABS
650.0000 mg | ORAL_TABLET | Freq: Four times a day (QID) | ORAL | Status: DC | PRN
  Administered 2016-11-11 (×2): 650 mg via ORAL
  Filled 2016-11-11 (×2): qty 2

## 2016-11-11 MED ORDER — GABAPENTIN 300 MG PO CAPS
600.0000 mg | ORAL_CAPSULE | Freq: Three times a day (TID) | ORAL | Status: DC
  Administered 2016-11-11 – 2016-11-12 (×2): 600 mg via ORAL
  Filled 2016-11-11 (×2): qty 2

## 2016-11-11 MED ORDER — AMLODIPINE BESYLATE 5 MG PO TABS
5.0000 mg | ORAL_TABLET | Freq: Every day | ORAL | Status: DC
  Administered 2016-11-12: 5 mg via ORAL
  Filled 2016-11-11: qty 1

## 2016-11-11 MED ORDER — ZIPRASIDONE HCL 80 MG PO CAPS: 80.0000 mg | ORAL_CAPSULE | Freq: Two times a day (BID) | ORAL | Status: DC

## 2016-11-11 MED ORDER — ALBUTEROL SULFATE (2.5 MG/3ML) 0.083% IN NEBU: 2.5000 mg | INHALATION_SOLUTION | RESPIRATORY_TRACT | Status: DC | PRN

## 2016-11-11 MED ORDER — PANTOPRAZOLE SODIUM 40 MG PO TBEC
40.0000 mg | DELAYED_RELEASE_TABLET | Freq: Every day | ORAL | Status: DC
  Administered 2016-11-12: 40 mg via ORAL
  Filled 2016-11-11: qty 1

## 2016-11-11 MED ORDER — NICOTINE POLACRILEX 2 MG MT GUM
2.0000 mg | CHEWING_GUM | OROMUCOSAL | Status: DC | PRN
  Filled 2016-11-11: qty 1

## 2016-11-11 MED ORDER — ZIPRASIDONE HCL 80 MG PO CAPS
80.0000 mg | ORAL_CAPSULE | Freq: Two times a day (BID) | ORAL | Status: DC
  Administered 2016-11-11 – 2016-11-12 (×2): 80 mg via ORAL
  Filled 2016-11-11 (×2): qty 1

## 2016-11-11 NOTE — Progress Notes (Signed)
PROGRESS NOTE    Dylan Pullerapoleon Rainwater  WUJ:[id number] DOB: 02/23/1961 DOA: 11/10/2016 PCP: Isabella StallingNDIEGO,RICHARD M, MD    Brief Narrative: [name] is a 55 y.o. male with medical history significant of NIDDM, diabetic neuropathy, HTN, schizophrenia who present for cough and fever for 2 days. CXR shows left lower lobe pneumonia.   Assessment & Plan:   Principal Problem:   Lobar pneumonia (HCC) Active Problems:   Essential hypertension   Non-insulin treated type 2 diabetes mellitus (HCC)   Chronic paranoid schizophrenia (HCC)   Sepsis (HCC)   Left lower lobe pneumonia: Admitted for IV antibiotics.  Started on vancomycin and zosyn. D/ced vanco and MRSA NEGATIVE.  Respiratory panel shows rhino virus.  HIV antibody is negative.  Blood cultures not done on admission.  [place] oxygen to keep sats greater than 90%.   Diabetes Mellitus: CBG (last 3)   Recent Labs  11/11/16 0807 11/11/16 1202 11/11/16 1714  GLUCAP 263* 165* 165*    Resume SSI.   Schizophrenia: Resume home meds.    DVT prophylaxis: (Lovenox) Code Status: (Full/) Family Communication: none at bedside.  Disposition Plan: pending.    Consultants:   None.    Procedures:  None.    Antimicrobials: vanc, zosyn 11/18   Subjective: Restless, agitated.  Objective: Vitals:   11/11/16 0102 11/11/16 0300 11/11/16 0605 11/11/16 1720  BP: 137/82  132/75 131/87  Pulse: 99  87 91  Resp: 19  17 18   Temp: (!) 101.2 F (38.4 C) 100 F (37.8 C) 97.6 F (36.4 C) 98.4 F (36.9 C)  TempSrc: Oral Oral Axillary Oral  SpO2: 98%  95% 97%  Weight:      Height:        Intake/Output Summary (Last 24 hours) at 11/11/16 1839 Last data filed at 11/11/16 1825  Gross per 24 hour  Intake             1230 ml  Output                4 ml  Net             1226 ml   Filed Weights   11/10/16 1542 11/10/16 1900  Weight: 76.2 kg (168 lb) 84.7 kg (186 lb 11.7 oz)    Examination:  General exam: Appears calm and  comfortable  Respiratory system: Clear to auscultation. Respiratory effort normal. Cardiovascular system: S1 & S2 heard, RRR. No JVD, murmurs, rubs, gallops or clicks. No pedal edema. Gastrointestinal system: Abdomen is nondistended, soft and nontender. No organomegaly or masses felt. Normal bowel sounds heard. Central nervous system: Alert and oriented. No focal neurological deficits. Extremities: Symmetric 5 x 5 power. Skin: No rashes, lesions or ulcers Psychiatry: Judgement and insight appear normal. Mood & affect appropriate.     Data Reviewed: I have personally reviewed following labs and imaging studies  CBC:  Recent Labs Lab 11/10/16 1600  WBC 13.8*  NEUTROABS 11.2*  HGB 13.7  HCT 41.4  MCV 88.7  PLT 173   Basic Metabolic Panel:  Recent Labs Lab 11/10/16 1600  NA 134*  K 4.1  CL 102  CO2 24  GLUCOSE 371*  BUN 14  CREATININE 1.06  CALCIUM 8.9   GFR: Estimated Creatinine Clearance: 86.4 mL/min (by C-G formula based on SCr of 1.06 mg/dL). Liver Function Tests: No results for input(s): AST, ALT, ALKPHOS, BILITOT, PROT, ALBUMIN in the last 168 hours. No results for input(s): LIPASE, AMYLASE in the last 168 hours. No results for input(s):  AMMONIA in the last 168 hours. Coagulation Profile: No results for input(s): INR, PROTIME in the last 168 hours. Cardiac Enzymes: No results for input(s): CKTOTAL, CKMB, CKMBINDEX, TROPONINI in the last 168 hours. BNP (last 3 results) No results for input(s): PROBNP in the last 8760 hours. HbA1C: No results for input(s): HGBA1C in the last 72 hours. CBG:  Recent Labs Lab 11/05/16 1208 11/11/16 0045 11/11/16 0807 11/11/16 1202 11/11/16 1714  GLUCAP 138* 356* 263* 165* 165*   Lipid Profile: No results for input(s): CHOL, HDL, LDLCALC, TRIG, CHOLHDL, LDLDIRECT in the last 72 hours. Thyroid Function Tests: No results for input(s): TSH, T4TOTAL, FREET4, T3FREE, THYROIDAB in the last 72 hours. Anemia Panel: No results  for input(s): VITAMINB12, FOLATE, FERRITIN, TIBC, IRON, RETICCTPCT in the last 72 hours. Sepsis Labs: No results for input(s): PROCALCITON, LATICACIDVEN in the last 168 hours.  Recent Results (from the past 240 hour(s))  MRSA PCR Screening     Status: None   Collection Time: 11/11/16  5:14 AM  Result Value Ref Range Status   MRSA by PCR NEGATIVE NEGATIVE Final    Comment:        The GeneXpert MRSA Assay (FDA approved for NASAL specimens only), is one component of a comprehensive MRSA colonization surveillance program. It is not intended to diagnose MRSA infection nor to guide or monitor treatment for MRSA infections.   Respiratory Panel by PCR     Status: Abnormal   Collection Time: 11/11/16  6:13 AM  Result Value Ref Range Status   Adenovirus NOT DETECTED NOT DETECTED Final   Coronavirus 229E NOT DETECTED NOT DETECTED Final   Coronavirus HKU1 NOT DETECTED NOT DETECTED Final   Coronavirus NL63 NOT DETECTED NOT DETECTED Final   Coronavirus OC43 NOT DETECTED NOT DETECTED Final   Metapneumovirus NOT DETECTED NOT DETECTED Final   Rhinovirus / Enterovirus DETECTED (A) NOT DETECTED Final   Influenza A NOT DETECTED NOT DETECTED Final   Influenza B NOT DETECTED NOT DETECTED Final   Parainfluenza Virus 1 NOT DETECTED NOT DETECTED Final   Parainfluenza Virus 2 NOT DETECTED NOT DETECTED Final   Parainfluenza Virus 3 NOT DETECTED NOT DETECTED Final   Parainfluenza Virus 4 NOT DETECTED NOT DETECTED Final   Respiratory Syncytial Virus NOT DETECTED NOT DETECTED Final   Bordetella pertussis NOT DETECTED NOT DETECTED Final   Chlamydophila pneumoniae NOT DETECTED NOT DETECTED Final   Mycoplasma pneumoniae NOT DETECTED NOT DETECTED Final    Comment: Performed [name]         Radiology Studies: Dg Chest 2 View  Result Date: 11/10/2016 CLINICAL DATA:  Cough for 2 days. EXAM: CHEST  2 VIEW COMPARISON:  06/07/2015 and prior chest radiographs FINDINGS: The cardiomediastinal  silhouette is unremarkable. Mild streaky/airspace opacities within the left lower lobe likely represents early or mild pneumonia. There is no evidence of pulmonary edema, suspicious pulmonary nodule/mass, pleural effusion, or pneumothorax. No acute bony abnormalities are identified. IMPRESSION: Probable left lower lobe pneumonia. Radiographic follow-up to resolution is recommended. Electronically Signed   By: Harmon PierJeffrey  Hu M.D.   On: 11/10/2016 16:38        Scheduled Meds: . amLODipine  5 mg Oral Daily  . atorvastatin  20 mg Oral Daily  . ceFEPime (MAXIPIME) IV  1 g Intravenous Q8H  . divalproex  500 mg Oral BID  . enoxaparin (LOVENOX) injection  40 mg Subcutaneous Q24H  . gabapentin  600 mg Oral TID  . insulin aspart  0-15 Units Subcutaneous  TID WC  . insulin aspart  0-5 Units Subcutaneous QHS  . nicotine  14 mg Transdermal Daily  . pantoprazole  40 mg Oral Daily  . sodium chloride flush  3 mL Intravenous Q12H  . traZODone  100 mg Oral QHS  . trihexyphenidyl  2 mg Oral QHS  . ziprasidone  80 mg Oral BID WC   Continuous Infusions:   LOS: 0 days    Time spent: 25 minutes.     Kathlen Mody, MD [facility] Pager [phone]  If 7PM-7AM, please contact night-coverage [email or web address] Password [facility] 11/11/2016, 6:39 PM

## 2016-11-11 NOTE — Progress Notes (Signed)
Pt continues to refuse care, unable to administer scheduled evening medications.  Justin Mendaudle, Lizann Edelman H, RN

## 2016-11-11 NOTE — Progress Notes (Signed)
Patient IV site was infiltrated, patient refused to Stick him. RN explain about the IV ABT tonight but still refuses stated "i dont want to be stick no more and they can change my ABT to pill form". N.P. Informed. Continue to monitor

## 2016-11-11 NOTE — Progress Notes (Addendum)
Pt refused care from assigned RN, stated "you ain't my nurse, get out of here" and proceeded to slam the door. AC made aware and attempting to give scheduled antibiotics.  Will continue to monitor pt and make assessment as best possible. Justin Mendaudle, Rudy Luhmann H, RN

## 2016-11-11 NOTE — Progress Notes (Signed)
Pt arrived to unit room 1514 via stretcher. . Pt combative. Unable to orient to room or perform assessment

## 2016-11-11 NOTE — Progress Notes (Signed)
Pt refused morning labs. Attending notified.

## 2016-11-11 NOTE — Progress Notes (Signed)
This shift pt very angry,  disruptive and combative, yelling and throwing things in the direction of the staff. Refuses ANY services from " any and  all white people". RN assignment adjusted so this RN could take over pt care. Initially , also very disruptive and disparaging to me. Pt refused vitals, blood sugar ,  Nasal swabs and  labs. All nursing care held until pt able to settle down. Will continue to monitor and intervene appropriately

## 2016-11-11 NOTE — Progress Notes (Signed)
Pt has become increasingly aggressive verbally w/ all nursing care on the unit.  Security called to escort pt back to room. Pt request tylenol but was very resistant to take from the assigned RN, w/ [employer] and security present.  MD paged to restart prior to admission psych drugs. Will attempt to administer to pt.  Justin Mendaudle, Yulia Ulrich H, RN

## 2016-11-12 LAB — GLUCOSE, CAPILLARY: GLUCOSE-CAPILLARY: 231 mg/dL — AB (ref 65–99)

## 2016-11-12 MED ORDER — LEVOFLOXACIN 750 MG PO TABS: 750.0000 mg | ORAL_TABLET | Freq: Every day | ORAL | 0 refills | Status: DC

## 2016-11-12 MED ORDER — HYDROCOD POLST-CPM POLST ER 10-8 MG/5ML PO SUER
5.0000 mL | Freq: Once | ORAL | Status: AC
Start: 2016-11-12 — End: 2016-11-12
  Administered 2016-11-12: 5 mL via ORAL
  Filled 2016-11-12 (×2): qty 5

## 2016-11-12 NOTE — ED Notes (Signed)
Pt at ED Registration asking for a bus pass.  Pt's chart accessed to determine most recent location.  This Clinical research associatewriter confirmed w/ 5E staff that Pt had not received a bus pass.  Pass obtained and provided to Pt.  (11/20 @ 1125)

## 2016-11-12 NOTE — Progress Notes (Signed)
11/12/16  1030 Reviewed discharge instructions with patient. Patient verbalized understanding. Copy of discharge instructions and prescription given to patient.

## 2016-11-12 NOTE — Progress Notes (Signed)
11/12/16  Paged MD. Patient refused to have new IV placed. No IV access. Waiting response.

## 2016-11-12 NOTE — Care Management Note (Signed)
Case Management Note  Patient Details  Name: Katheren Pullerapoleon Shanafelt MRN: [id number] Date of Birth: 1961-01-09  Subjective/Objective: 55 y/o m admitted w/lobar pna.Hx: schizophrenia. From home.                   Action/Plan:d/c home no needs or orders.                 Expected Discharge Plan:  Home/Self Care  In-House Referral:     Discharge planning Services  CM Consult  Post Acute Care Choice:    Choice offered to:     DME Arranged:    DME Agency:     HH Arranged:    HH Agency:     Status of Service:  Completed, signed off  If discussed at [employer] Length of Stay Meetings, dates discussed:    Additional Comments:  Lanier ClamMahabir, Lakeitha Basques, RN 11/12/2016, 10:36 AM

## 2016-11-13 NOTE — Discharge Summary (Signed)
Physician Discharge Summary  [name]:[id number] DOB: May 30, 1961 DOA: 11/10/2016  PCP: Isabella Stalling, MD  Admit date: 11/10/2016 Discharge date: 11/12/2016  Admitted From: Home Disposition:  Shelter.  Recommendations for Outpatient Follow-up:  1. Follow up with PCP in 1-2 weeks 2. Please obtain BMP/CBC in one week     Discharge Condition:stable.  CODE STATUS:full code.  Diet recommendation: Heart Healthy /  Brief/Interim Summary: [name] a 55 y.o.malewith medical history significant of NIDDM, diabetic neuropathy, HTN, schizophrenia who present for cough and fever for 2 days. CXR shows left lower lobe pneumonia.   Discharge Diagnoses:  Principal Problem:   Lobar pneumonia (HCC) Active Problems:   Essential hypertension   Non-insulin treated type 2 diabetes mellitus (HCC)   Chronic paranoid schizophrenia (HCC)   Sepsis (HCC)   Left lower lobe pneumonia: Admitted for IV antibiotics.  Started on vancomycin and zosyn. D/ced vanco as MRSA swab  NEGATIVE.  Respiratory panel shows rhino virus.  HIV antibody is negative.  Blood cultures not done on admission.  Pt persistent about being discharged, discharged him on oral levaquin to complete the course.     Diabetes Mellitus: CBG (last 3)   Recent Labs (last 2 labs)    Recent Labs  11/11/16 0807 11/11/16 1202 11/11/16 1714  GLUCAP 263* 165* 165*     Resume home meds on discharge.   Schizophrenia: Resume home meds.    Discharge Instructions  Discharge Instructions    Diet - low sodium heart healthy    Complete by:  As directed    Discharge instructions    Complete by:  As directed    Follow up with PCP in one week.       Medication List    TAKE these medications   albuterol 108 (90 Base) MCG/ACT inhaler Commonly known as:  PROVENTIL HFA;VENTOLIN HFA Inhale 2 puffs into the lungs every 4 (four) hours as needed for wheezing or shortness of breath (cough).    amLODipine 5 MG tablet Commonly known as:  NORVASC Take 5 mg by mouth daily.   atorvastatin 20 MG tablet Commonly known as:  LIPITOR Take 20 mg by mouth daily. Reported on 05/01/2016   divalproex 500 MG DR tablet Commonly known as:  DEPAKOTE Take 1 tablet (500 mg total) by mouth 2 (two) times daily.   gabapentin 300 MG capsule Commonly known as:  NEURONTIN Take 2 capsules (600 mg total) by mouth 3 (three) times daily.   hydrochlorothiazide 25 MG tablet Commonly known as:  HYDRODIURIL Take 25 mg by mouth daily.   hydrOXYzine 25 MG tablet Commonly known as:  ATARAX/VISTARIL Take 1 tablet (25 mg total) by mouth every 6 (six) hours as needed for anxiety.   levofloxacin 750 MG tablet Commonly known as:  LEVAQUIN Take 1 tablet (750 mg total) by mouth daily.   linagliptin 5 MG Tabs tablet Commonly known as:  TRADJENTA Take 5 mg by mouth daily.   metFORMIN 1000 MG tablet Commonly known as:  GLUCOPHAGE Take 1,000 mg by mouth 2 (two) times daily with a meal.   nicotine polacrilex 2 MG gum Commonly known as:  NICORETTE Take 1 each (2 mg total) by mouth as needed for smoking cessation.   pantoprazole 40 MG tablet Commonly known as:  PROTONIX Take 40 mg by mouth daily.   traZODone 100 MG tablet Commonly known as:  DESYREL Take 1 tablet (100 mg total) by mouth at bedtime.   trihexyphenidyl 2 MG tablet Commonly known as:  ARTANE  Take 1 tablet (2 mg total) by mouth at bedtime.   ziprasidone 80 MG capsule Commonly known as:  GEODON Take 1 capsule (80 mg total) by mouth 2 (two) times daily with a meal.      Follow-up Information    DONDIEGO,RICHARD M, MD. Schedule an appointment as soon as possible for a visit in 1 week(s).   Specialty:  Internal Medicine Contact information: [address] [place] [address] [phone]          Allergies  Allergen Reactions  . Benadryl [Diphenhydramine Hcl]   . Diphenhydramine     GI upset, paradoxic agitation  .  Haldol [Haloperidol Lactate]   . Haldol [Haloperidol]     "makes me tremble, bite my tongue"  . Lisinopril Swelling  . Thorazine [Chlorpromazine]     Tremble, bite my tongue  . Thorazine [Chlorpromazine]     Consultations:  None    Procedures/Studies: Dg Chest 2 View  Result Date: 11/10/2016 CLINICAL DATA:  Cough for 2 days. EXAM: CHEST  2 VIEW COMPARISON:  06/07/2015 and prior chest radiographs FINDINGS: The cardiomediastinal silhouette is unremarkable. Mild streaky/airspace opacities within the left lower lobe likely represents early or mild pneumonia. There is no evidence of pulmonary edema, suspicious pulmonary nodule/mass, pleural effusion, or pneumothorax. No acute bony abnormalities are identified. IMPRESSION: Probable left lower lobe pneumonia. Radiographic follow-up to resolution is recommended. Electronically Signed   By: Harmon Pier M.D.   On: 11/10/2016 16:38       Subjective: Wants to go home , no new complaints.   Discharge Exam: Vitals:   11/11/16 2107 11/12/16 1009  BP: 117/80 110/75  Pulse: 88 85  Resp: 17   Temp: 98.6 F (37 C)    Vitals:   11/11/16 0605 11/11/16 1720 11/11/16 2107 11/12/16 1009  BP: 132/75 131/87 117/80 110/75  Pulse: 87 91 88 85  Resp: 17 18 17    Temp: 97.6 F (36.4 C) 98.4 F (36.9 C) 98.6 F (37 C)   TempSrc: Axillary Oral Oral   SpO2: 95% 97% 98% 97%  Weight:      Height:        General: Pt is alert, awake, not in acute distress Cardiovascular: RRR, S1/S2 +, no rubs, no gallops Respiratory: CTA bilaterally, no wheezing, no rhonchi Abdominal: Soft, NT, ND, bowel sounds + Extremities: no edema, no cyanosis    The results of significant diagnostics from this hospitalization (including imaging, microbiology, ancillary and laboratory) are listed below for reference.     Microbiology: Recent Results (from the past 240 hour(s))  MRSA PCR Screening     Status: None   Collection Time: 11/11/16  5:14 AM  Result Value Ref  Range Status   MRSA by PCR NEGATIVE NEGATIVE Final    Comment:        The GeneXpert MRSA Assay (FDA approved for NASAL specimens only), is one component of a comprehensive MRSA colonization surveillance program. It is not intended to diagnose MRSA infection nor to guide or monitor treatment for MRSA infections.   Respiratory Panel by PCR     Status: Abnormal   Collection Time: 11/11/16  6:13 AM  Result Value Ref Range Status   Adenovirus NOT DETECTED NOT DETECTED Final   Coronavirus 229E NOT DETECTED NOT DETECTED Final   Coronavirus HKU1 NOT DETECTED NOT DETECTED Final   Coronavirus NL63 NOT DETECTED NOT DETECTED Final   Coronavirus OC43 NOT DETECTED NOT DETECTED Final   Metapneumovirus NOT DETECTED NOT DETECTED Final  Rhinovirus / Enterovirus DETECTED (A) NOT DETECTED Final   Influenza A NOT DETECTED NOT DETECTED Final   Influenza B NOT DETECTED NOT DETECTED Final   Parainfluenza Virus 1 NOT DETECTED NOT DETECTED Final   Parainfluenza Virus 2 NOT DETECTED NOT DETECTED Final   Parainfluenza Virus 3 NOT DETECTED NOT DETECTED Final   Parainfluenza Virus 4 NOT DETECTED NOT DETECTED Final   Respiratory Syncytial Virus NOT DETECTED NOT DETECTED Final   Bordetella pertussis NOT DETECTED NOT DETECTED Final   Chlamydophila pneumoniae NOT DETECTED NOT DETECTED Final   Mycoplasma pneumoniae NOT DETECTED NOT DETECTED Final    Comment: Performed at [facility]     Labs: BNP (last 3 results) No results for input(s): BNP in the last 8760 hours. Basic Metabolic Panel:  Recent Labs Lab 11/10/16 1600  NA 134*  K 4.1  CL 102  CO2 24  GLUCOSE 371*  BUN 14  CREATININE 1.06  CALCIUM 8.9   Liver Function Tests: No results for input(s): AST, ALT, ALKPHOS, BILITOT, PROT, ALBUMIN in the last 168 hours. No results for input(s): LIPASE, AMYLASE in the last 168 hours. No results for input(s): AMMONIA in the last 168 hours. CBC:  Recent Labs Lab 11/10/16 1600  WBC 13.8*   NEUTROABS 11.2*  HGB 13.7  HCT 41.4  MCV 88.7  PLT 173   Cardiac Enzymes: No results for input(s): CKTOTAL, CKMB, CKMBINDEX, TROPONINI in the last 168 hours. BNP: Invalid input(s): POCBNP CBG:  Recent Labs Lab 11/11/16 0807 11/11/16 1202 11/11/16 1714 11/11/16 2130 11/12/16 0732  GLUCAP 263* 165* 165* 251* 231*   D-Dimer No results for input(s): DDIMER in the last 72 hours. Hgb A1c No results for input(s): HGBA1C in the last 72 hours. Lipid Profile No results for input(s): CHOL, HDL, LDLCALC, TRIG, CHOLHDL, LDLDIRECT in the last 72 hours. Thyroid function studies No results for input(s): TSH, T4TOTAL, T3FREE, THYROIDAB in the last 72 hours.  Invalid input(s): FREET3 Anemia work up No results for input(s): VITAMINB12, FOLATE, FERRITIN, TIBC, IRON, RETICCTPCT in the last 72 hours. Urinalysis    Component Value Date/Time   COLORURINE YELLOW 10/30/2016 1202   APPEARANCEUR CLEAR 10/30/2016 1202   LABSPEC 1.008 10/30/2016 1202   PHURINE 5.5 10/30/2016 1202   GLUCOSEU 500 (A) 10/30/2016 1202   HGBUR NEGATIVE 10/30/2016 1202   BILIRUBINUR NEGATIVE 10/30/2016 1202   KETONESUR NEGATIVE 10/30/2016 1202   PROTEINUR NEGATIVE 10/30/2016 1202   UROBILINOGEN 0.2 11/09/2010 2330   NITRITE NEGATIVE 10/30/2016 1202   LEUKOCYTESUR NEGATIVE 10/30/2016 1202   Sepsis Labs Invalid input(s): PROCALCITONIN,  WBC,  LACTICIDVEN Microbiology Recent Results (from the past 240 hour(s))  MRSA PCR Screening     Status: None   Collection Time: 11/11/16  5:14 AM  Result Value Ref Range Status   MRSA by PCR NEGATIVE NEGATIVE Final    Comment:        The GeneXpert MRSA Assay (FDA approved for NASAL specimens only), is one component of a comprehensive MRSA colonization surveillance program. It is not intended to diagnose MRSA infection nor to guide or monitor treatment for MRSA infections.   Respiratory Panel by PCR     Status: Abnormal   Collection Time: 11/11/16  6:13 AM  Result  Value Ref Range Status   Adenovirus NOT DETECTED NOT DETECTED Final   Coronavirus 229E NOT DETECTED NOT DETECTED Final   Coronavirus HKU1 NOT DETECTED NOT DETECTED Final   Coronavirus NL63 NOT DETECTED NOT DETECTED Final   Coronavirus OC43 NOT  DETECTED NOT DETECTED Final   Metapneumovirus NOT DETECTED NOT DETECTED Final   Rhinovirus / Enterovirus DETECTED (A) NOT DETECTED Final   Influenza A NOT DETECTED NOT DETECTED Final   Influenza B NOT DETECTED NOT DETECTED Final   Parainfluenza Virus 1 NOT DETECTED NOT DETECTED Final   Parainfluenza Virus 2 NOT DETECTED NOT DETECTED Final   Parainfluenza Virus 3 NOT DETECTED NOT DETECTED Final   Parainfluenza Virus 4 NOT DETECTED NOT DETECTED Final   Respiratory Syncytial Virus NOT DETECTED NOT DETECTED Final   Bordetella pertussis NOT DETECTED NOT DETECTED Final   Chlamydophila pneumoniae NOT DETECTED NOT DETECTED Final   Mycoplasma pneumoniae NOT DETECTED NOT DETECTED Final    Comment: Performed at [facility]     Time coordinating discharge: Over 30 minutes  SIGNED:   Kathlen ModyAKULA,Alcides Nutting, MD  [facility] 11/13/2016, 10:13 AM Pager   If 7PM-7AM, please contact night-coverage [email or web address] Password [email or web address]

## 2016-11-30 ENCOUNTER — Encounter (HOSPITAL_COMMUNITY): Payer: Self-pay | Admitting: Emergency Medicine

## 2016-11-30 ENCOUNTER — Emergency Department (HOSPITAL_COMMUNITY)
Admission: EM | Admit: 2016-11-30 | Discharge: 2016-11-30 | Discharge status: Against Medical Advice | Payer: Medicaid Other | Attending: Emergency Medicine | Admitting: Emergency Medicine

## 2016-11-30 DIAGNOSIS — E1165 Type 2 diabetes mellitus with hyperglycemia: Secondary | ICD-10-CM | POA: Insufficient documentation

## 2016-11-30 DIAGNOSIS — E1140 Type 2 diabetes mellitus with diabetic neuropathy, unspecified: Secondary | ICD-10-CM | POA: Insufficient documentation

## 2016-11-30 DIAGNOSIS — R739 Hyperglycemia, unspecified: Secondary | ICD-10-CM

## 2016-11-30 DIAGNOSIS — I10 Essential (primary) hypertension: Secondary | ICD-10-CM | POA: Diagnosis not present

## 2016-11-30 DIAGNOSIS — Z79899 Other long term (current) drug therapy: Secondary | ICD-10-CM | POA: Insufficient documentation

## 2016-11-30 DIAGNOSIS — F17210 Nicotine dependence, cigarettes, uncomplicated: Secondary | ICD-10-CM | POA: Insufficient documentation

## 2016-11-30 LAB — CBC
HCT: 38.3 % — ABNORMAL LOW (ref 39.0–52.0)
Hemoglobin: 13.2 g/dL (ref 13.0–17.0)
MCH: 28.9 pg (ref 26.0–34.0)
MCHC: 34.5 g/dL (ref 30.0–36.0)
MCV: 83.8 fL (ref 78.0–100.0)
Platelets: 205 10*3/uL (ref 150–400)
RBC: 4.57 MIL/uL (ref 4.22–5.81)
RDW: 14.2 % (ref 11.5–15.5)
WBC: 9.5 10*3/uL (ref 4.0–10.5)

## 2016-11-30 LAB — URINALYSIS, ROUTINE W REFLEX MICROSCOPIC
Bacteria, UA: NONE SEEN
Bilirubin Urine: NEGATIVE
Glucose, UA: >=500 mg/dL — AB
Hgb urine dipstick: NEGATIVE
Ketones, ur: NEGATIVE mg/dL
Leukocytes, UA: NEGATIVE
Nitrite: NEGATIVE
Protein, ur: NEGATIVE mg/dL
RBC / HPF: NONE SEEN RBC/hpf (ref 0–5)
Specific Gravity, Urine: 1.021 (ref 1.005–1.030)
Squamous Epithelial / LPF: NONE SEEN
pH: 5 (ref 5.0–8.0)

## 2016-11-30 LAB — BASIC METABOLIC PANEL
Anion gap: 8 (ref 5–15)
BUN: 14 mg/dL (ref 6–20)
CO2: 25 mmol/L (ref 22–32)
Calcium: 8.5 mg/dL — ABNORMAL LOW (ref 8.9–10.3)
Chloride: 98 mmol/L — ABNORMAL LOW (ref 101–111)
Creatinine, Ser: 1.11 mg/dL (ref 0.61–1.24)
GFR calc Af Amer: >60 mL/min (ref >60)
GFR calc non Af Amer: >60 mL/min (ref >60)
Glucose, Bld: 363 mg/dL — ABNORMAL HIGH (ref 65–99)
Potassium: 4.6 mmol/L (ref 3.5–5.1)
Sodium: 131 mmol/L — ABNORMAL LOW (ref 135–145)

## 2016-11-30 LAB — CBG MONITORING, ED: Glucose-Capillary: 356 mg/dL — ABNORMAL HIGH (ref 65–99)

## 2016-11-30 NOTE — ED Triage Notes (Signed)
[facility] with complaint of hyperglycemia; denies pain. Pt has not been taking medication as prescribed.

## 2016-11-30 NOTE — Discharge Instructions (Signed)
Take your glipizide. This medication is for your diabetes. It is not a blood pressure medication and should not cause angioedema.

## 2016-11-30 NOTE — ED Notes (Signed)
Pt verbalizes he is getting the next bus and leaving. Pt understands he is not cleared by provider at present time. Pt signed out AMA.

## 2016-11-30 NOTE — ED Notes (Signed)
Bed: WA09 Expected date:  Expected time:  Means of arrival:  Comments: 55 yo M hyperglycemia/dizziness

## 2016-11-30 NOTE — ED Provider Notes (Addendum)
[facility]EMERGENCY DEPT Provider Note    By signing my name below, I, [name], attest that this documentation has been prepared under the direction and in the presence of [name], MD. Electronically Signed: Earmon PhoenixJennifer Boyd, ED Scribe. 11/30/16. 2:48 PM.    History   Chief Complaint Chief Complaint  Patient presents with  . Hyperglycemia   The history is provided by the patient and medical records. No language interpreter was used.    HPI Comments:  [name] is a homeless 55 y.o. male with PMHx of DM, HTN, HLD and schizophrenia brought in by EMS, who presents to the Emergency Department complaining of hyperglycemia. He states the nurse at the homeless shelter checked a CBG and told him it was high. Pt reports an associated HA. He states he has been taking his DM medications (and all other medications except HTN medications) as prescribed. Pt reports he has not been taking his Glipizide because he thought it was for his HTN and thought it was causing angioedema. He denies modifying factors. He denies fever, chills, nausea, vomiting, difficulty urinating.   Past Medical History:  Diagnosis Date  . Diabetes mellitus   . Diabetic neuropathy (HCC)   . High cholesterol   . Hypertension   . Schizophrenia [facility](HCC)     Patient Active Problem List   Diagnosis Date Noted  . Lobar pneumonia (HCC) 11/10/2016  . Non-insulin treated type 2 diabetes mellitus (HCC) 11/10/2016  . Chronic paranoid schizophrenia (HCC) 11/10/2016  . Sepsis (HCC) 11/10/2016  . Diabetes mellitus (HCC) 10/30/2016  . Essential hypertension 10/30/2016    Past Surgical History:  Procedure Laterality Date  . TONSILLECTOMY         Home Medications    Prior to Admission medications   Medication Sig Start Date End Date Taking? Authorizing Provider  albuterol (PROVENTIL HFA;VENTOLIN HFA) 108 (90 Base) MCG/ACT inhaler Inhale 2 puffs into the lungs every 4 (four) hours as needed for wheezing or  shortness of breath (cough). 05/13/16   Mercedes Camprubi-Soms, PA-C  amLODipine (NORVASC) 5 MG tablet Take 5 mg by mouth daily.      Historical Provider, MD  atorvastatin (LIPITOR) 20 MG tablet Take 20 mg by mouth daily. Reported on 05/01/2016    Historical Provider, MD  divalproex (DEPAKOTE) 500 MG DR tablet Take 1 tablet (500 mg total) by mouth 2 (two) times daily. 11/05/16   Beau FannyJohn C Withrow, FNP  gabapentin (NEURONTIN) 300 MG capsule Take 2 capsules (600 mg total) by mouth 3 (three) times daily. 11/05/16   Oneta Rackanika N Lewis, NP  hydrochlorothiazide (HYDRODIURIL) 25 MG tablet Take 25 mg by mouth daily.    Historical Provider, MD  hydrOXYzine (ATARAX/VISTARIL) 25 MG tablet Take 1 tablet (25 mg total) by mouth every 6 (six) hours as needed for anxiety. 11/05/16   Oneta Rackanika N Lewis, NP  levofloxacin (LEVAQUIN) 750 MG tablet Take 1 tablet (750 mg total) by mouth daily. 11/12/16   Kathlen ModyVijaya Akula, MD  linagliptin (TRADJENTA) 5 MG TABS tablet Take 5 mg by mouth daily.    Historical Provider, MD  metFORMIN (GLUCOPHAGE) 1000 MG tablet Take 1,000 mg by mouth 2 (two) times daily with a meal.      Historical Provider, MD  nicotine polacrilex (NICORETTE) 2 MG gum Take 1 each (2 mg total) by mouth as needed for smoking cessation. 11/05/16   Oneta Rackanika N Lewis, NP  pantoprazole (PROTONIX) 40 MG tablet Take 40 mg by mouth daily.    Historical Provider, MD  traZODone (DESYREL)  100 MG tablet Take 1 tablet (100 mg total) by mouth at bedtime. 11/05/16   Oneta Rackanika N Lewis, NP  trihexyphenidyl (ARTANE) 2 MG tablet Take 1 tablet (2 mg total) by mouth at bedtime. 11/05/16   Oneta Rackanika N Lewis, NP  ziprasidone (GEODON) 80 MG capsule Take 1 capsule (80 mg total) by mouth 2 (two) times daily with a meal. 11/05/16   Oneta Rackanika N Lewis, NP    Family History Family History  Problem Relation Age of Onset  . Hypertension Mother   . Cancer Father   . Alcoholism Other     Social History Social History  Substance Use Topics  . Smoking status:  Current Every Day Smoker    Packs/day: 0.50    Types: Cigarettes  . Smokeless tobacco: Never Used  . Alcohol use No     Allergies   Benadryl [diphenhydramine hcl]; Diphenhydramine; Haldol [haloperidol lactate]; Haldol [haloperidol]; Lisinopril; Thorazine [chlorpromazine]; and Thorazine [chlorpromazine]   Review of Systems Review of Systems A complete 10 system review of systems was obtained and all systems are negative except as noted in the HPI and PMH.    Physical Exam Updated Vital Signs BP (!) 153/103   Pulse 84   Temp 98.6 F (37 C) (Oral)   Resp 15   SpO2 98%   Physical Exam  Constitutional: He is oriented to person, place, and time. He appears well-developed and well-nourished.  HENT:  Head: Normocephalic.  Eyes: EOM are normal.  Neck: Normal range of motion.  Cardiovascular: Normal rate, regular rhythm and normal heart sounds.   Pulmonary/Chest: Effort normal and breath sounds normal. No respiratory distress.  Abdominal: Soft. He exhibits no distension. There is no tenderness.  Musculoskeletal: Normal range of motion.  Neurological: He is alert and oriented to person, place, and time.  Psychiatric: He has a normal mood and affect.  Nursing note and vitals reviewed.    ED Treatments / Results  DIAGNOSTIC STUDIES: Oxygen Saturation is 98% on RA, normal by my interpretation.   COORDINATION OF CARE: 2:45 PM- Informed pt that he should continue taking his medications as prescribed, including the Glipizide. Pt verbalizes understanding and agrees to plan.  Medications - No data to display  Labs (all labs ordered are listed, but only abnormal results are displayed) Labs Reviewed  CBG MONITORING, ED - Abnormal; Notable for the following:       Result Value   Glucose-Capillary 356 (*)    All other components within normal limits  BASIC METABOLIC PANEL  CBC  URINALYSIS, ROUTINE W REFLEX MICROSCOPIC    EKG  EKG Interpretation None       Radiology No  results found.  Procedures Procedures (including critical care time)  Medications Ordered in ED Medications - No data to display   Initial Impression / Assessment and Plan / ED Course  I have reviewed the triage vital signs and the nursing notes.  Pertinent labs & imaging results that were available during my care of the patient were reviewed by me and considered in my medical decision making (see chart for details).  Clinical Course     55 year old male with hyperglycemia. He stopped taking his glipizide because he thought it was a blood pressure medication which was causing him angioedema. His medications were reviewed with him. He has no acute distress. I do not feel that further workup is indicated.    Final Clinical Impressions(s) / ED Diagnoses   Final diagnoses:  Hyperglycemia    New Prescriptions New Prescriptions  No medications on file   I personally preformed the services scribed in my presence. The recorded information has been reviewed is accurate. Raeford Razor, MD.    Raeford Razor, MD 12/04/16 1610    Raeford Razor, MD 12/04/16 2036

## 2016-11-30 NOTE — ED Notes (Signed)
CBG 356 

## 2016-12-05 ENCOUNTER — Encounter (HOSPITAL_COMMUNITY): Payer: Self-pay | Admitting: Emergency Medicine

## 2016-12-05 ENCOUNTER — Emergency Department (HOSPITAL_COMMUNITY)
Admission: EM | Admit: 2016-12-05 | Discharge: 2016-12-05 | Disposition: A | Discharge status: Home / Self Care | Payer: Medicaid Other | Attending: Emergency Medicine | Admitting: Emergency Medicine

## 2016-12-05 DIAGNOSIS — Z7984 Long term (current) use of oral hypoglycemic drugs: Secondary | ICD-10-CM | POA: Diagnosis not present

## 2016-12-05 DIAGNOSIS — R739 Hyperglycemia, unspecified: Secondary | ICD-10-CM

## 2016-12-05 DIAGNOSIS — F17210 Nicotine dependence, cigarettes, uncomplicated: Secondary | ICD-10-CM | POA: Diagnosis not present

## 2016-12-05 DIAGNOSIS — E1140 Type 2 diabetes mellitus with diabetic neuropathy, unspecified: Secondary | ICD-10-CM | POA: Insufficient documentation

## 2016-12-05 DIAGNOSIS — E1165 Type 2 diabetes mellitus with hyperglycemia: Secondary | ICD-10-CM | POA: Diagnosis not present

## 2016-12-05 DIAGNOSIS — I10 Essential (primary) hypertension: Secondary | ICD-10-CM | POA: Diagnosis not present

## 2016-12-05 DIAGNOSIS — Z9114 Patient's other noncompliance with medication regimen: Secondary | ICD-10-CM | POA: Diagnosis not present

## 2016-12-05 LAB — CBC WITH DIFFERENTIAL/PLATELET
BASOS ABS: 0.1 10*3/uL (ref 0.0–0.1)
Basophils Relative: 1 %
EOS PCT: 7 %
Eosinophils Absolute: 0.6 10*3/uL (ref 0.0–0.7)
HCT: 39.9 % (ref 39.0–52.0)
Hemoglobin: 13.3 g/dL (ref 13.0–17.0)
LYMPHS ABS: 3.1 10*3/uL (ref 0.7–4.0)
LYMPHS PCT: 33 %
MCH: 28.7 pg (ref 26.0–34.0)
MCHC: 33.3 g/dL (ref 30.0–36.0)
MCV: 86 fL (ref 78.0–100.0)
MONO ABS: 0.8 10*3/uL (ref 0.1–1.0)
Monocytes Relative: 9 %
Neutro Abs: 4.6 10*3/uL (ref 1.7–7.7)
Neutrophils Relative %: 50 %
PLATELETS: 153 10*3/uL (ref 150–400)
RBC: 4.64 MIL/uL (ref 4.22–5.81)
RDW: 14.5 % (ref 11.5–15.5)
WBC: 9.2 10*3/uL (ref 4.0–10.5)

## 2016-12-05 LAB — COMPREHENSIVE METABOLIC PANEL
ALT: 15 U/L — ABNORMAL LOW (ref 17–63)
AST: 23 U/L (ref 15–41)
Albumin: 3.5 g/dL (ref 3.5–5.0)
Alkaline Phosphatase: 54 U/L (ref 38–126)
Anion gap: 9 (ref 5–15)
BUN: 11 mg/dL (ref 6–20)
CHLORIDE: 100 mmol/L — AB (ref 101–111)
CO2: 26 mmol/L (ref 22–32)
Calcium: 9.3 mg/dL (ref 8.9–10.3)
Creatinine, Ser: 1.13 mg/dL (ref 0.61–1.24)
Glucose, Bld: 332 mg/dL — ABNORMAL HIGH (ref 65–99)
POTASSIUM: 4 mmol/L (ref 3.5–5.1)
Sodium: 135 mmol/L (ref 135–145)
Total Bilirubin: 0.5 mg/dL (ref 0.3–1.2)
Total Protein: 6.2 g/dL — ABNORMAL LOW (ref 6.5–8.1)

## 2016-12-05 LAB — URINALYSIS, ROUTINE W REFLEX MICROSCOPIC
Bacteria, UA: NONE SEEN
Bilirubin Urine: NEGATIVE
HGB URINE DIPSTICK: NEGATIVE
KETONES UR: 15 mg/dL — AB
Leukocytes, UA: NEGATIVE
NITRITE: NEGATIVE
PH: 6 (ref 5.0–8.0)
PROTEIN: NEGATIVE mg/dL
Specific Gravity, Urine: 1.015 (ref 1.005–1.030)
WBC UA: NONE SEEN WBC/hpf (ref 0–5)

## 2016-12-05 LAB — CBG MONITORING, ED
GLUCOSE-CAPILLARY: 333 mg/dL — AB (ref 65–99)
Glucose-Capillary: 236 mg/dL — ABNORMAL HIGH (ref 65–99)

## 2016-12-05 NOTE — ED Provider Notes (Signed)
[facility]-EMERGENCY DEPT Provider Note   CSN: [id number] Arrival date & time: 12/05/16  0114  By signing my name below, I, Rosario AdieWilliam Andrew Boyd, attest that this documentation has been prepared under the direction and in the presence of Arby BarretteMarcy Jalee Saine, MD. Electronically Signed: Rosario AdieWilliam Andrew Boyd, ED Scribe. 12/05/16. 4:01 AM.  History   Chief Complaint Chief Complaint  Patient presents with  . Hyperglycemia   The history is provided by the patient and the EMS personnel. No language interpreter was used.    HPI Comments: [name] is a 55 y.o. male BIB EMS, with a PMHx of DM and HTN, who presents to the Emergency Department complaining of hyperglycemia. [facility] who informed the pt that he had an elevated CBG of 434. EMS administered 500mL NS bolus en route which lowered his CBG to 333 before their arrival in the ED. When asked about this issue, he states that does not know how to draw up his Insulin, and has not been taking this medication due to this. There are no modifying or alleviating factors. There are no other associated symptoms or complaints. He denies fever, chills, or any other associated symptoms.   Past Medical History:  Diagnosis Date  . Diabetes mellitus   . Diabetic neuropathy (HCC)   . High cholesterol   . Hypertension   . Schizophrenia [facility](HCC)     Patient Active Problem List   Diagnosis Date Noted  . Lobar pneumonia (HCC) 11/10/2016  . Non-insulin treated type 2 diabetes mellitus (HCC) 11/10/2016  . Chronic paranoid schizophrenia (HCC) 11/10/2016  . Sepsis (HCC) 11/10/2016  . Diabetes mellitus (HCC) 10/30/2016  . Essential hypertension 10/30/2016    Past Surgical History:  Procedure Laterality Date  . TONSILLECTOMY      Home Medications    Prior to Admission medications   Medication Sig Start Date End Date Taking? Authorizing Provider  albuterol (PROVENTIL HFA;VENTOLIN HFA) 108 (90 Base) MCG/ACT inhaler Inhale 2  puffs into the lungs every 4 (four) hours as needed for wheezing or shortness of breath (cough). 05/13/16   Mercedes Camprubi-Soms, PA-C  amLODipine (NORVASC) 5 MG tablet Take 5 mg by mouth daily.      Historical Provider, MD  atorvastatin (LIPITOR) 20 MG tablet Take 20 mg by mouth daily. Reported on 05/01/2016    Historical Provider, MD  divalproex (DEPAKOTE) 500 MG DR tablet Take 1 tablet (500 mg total) by mouth 2 (two) times daily. 11/05/16   Beau FannyJohn C Withrow, FNP  gabapentin (NEURONTIN) 300 MG capsule Take 2 capsules (600 mg total) by mouth 3 (three) times daily. 11/05/16   Oneta Rackanika N Lewis, NP  hydrochlorothiazide (HYDRODIURIL) 25 MG tablet Take 25 mg by mouth daily.    Historical Provider, MD  hydrOXYzine (ATARAX/VISTARIL) 25 MG tablet Take 1 tablet (25 mg total) by mouth every 6 (six) hours as needed for anxiety. 11/05/16   Oneta Rackanika N Lewis, NP  levofloxacin (LEVAQUIN) 750 MG tablet Take 1 tablet (750 mg total) by mouth daily. 11/12/16   Kathlen ModyVijaya Akula, MD  linagliptin (TRADJENTA) 5 MG TABS tablet Take 5 mg by mouth daily.    Historical Provider, MD  metFORMIN (GLUCOPHAGE) 1000 MG tablet Take 1,000 mg by mouth 2 (two) times daily with a meal.      Historical Provider, MD  nicotine polacrilex (NICORETTE) 2 MG gum Take 1 each (2 mg total) by mouth as needed for smoking cessation. 11/05/16   Oneta Rackanika N Lewis, NP  pantoprazole (PROTONIX) 40 MG  tablet Take 40 mg by mouth daily.    Historical Provider, MD  traZODone (DESYREL) 100 MG tablet Take 1 tablet (100 mg total) by mouth at bedtime. 11/05/16   Oneta Rackanika N Lewis, NP  trihexyphenidyl (ARTANE) 2 MG tablet Take 1 tablet (2 mg total) by mouth at bedtime. 11/05/16   Oneta Rackanika N Lewis, NP  ziprasidone (GEODON) 80 MG capsule Take 1 capsule (80 mg total) by mouth 2 (two) times daily with a meal. 11/05/16   Oneta Rackanika N Lewis, NP   Family History Family History  Problem Relation Age of Onset  . Hypertension Mother   . Cancer Father   . Alcoholism Other    Social  History Social History  Substance Use Topics  . Smoking status: Current Every Day Smoker    Packs/day: 0.50    Types: Cigarettes  . Smokeless tobacco: Never Used  . Alcohol use No   Allergies   Benadryl [diphenhydramine hcl]; Diphenhydramine; Haldol [haloperidol lactate]; Haldol [haloperidol]; Lisinopril; Thorazine [chlorpromazine]; and Thorazine [chlorpromazine]  Review of Systems Review of Systems 10 Systems reviewed and all are negative for acute change except as noted in the HPI.  Physical Exam Updated Vital Signs BP 135/98 (BP Location: Left Arm)   Pulse 85   Temp 97.6 F (36.4 C) (Oral)   Resp 18   Ht 5\' 9"  (1.753 m)   Wt 180 lb 8 oz (81.9 kg)   SpO2 100%   BMI 26.66 kg/m   Physical Exam  Constitutional: He is oriented to person, place, and time. He appears well-developed and well-nourished. No distress.  Upon entering the room, the patient was sleeping soundly. No signs of distress. Upon being awakened the patient is alert and appropriate.  HENT:  Head: Normocephalic and atraumatic.  Mouth/Throat: Oropharynx is clear and moist.  Eyes: Conjunctivae and EOM are normal. Pupils are equal, round, and reactive to light.  Neck: Normal range of motion.  Cardiovascular: Normal rate and regular rhythm.   Pulmonary/Chest: Effort normal and breath sounds normal.  Abdominal: Soft. He exhibits no distension. There is no tenderness.  Musculoskeletal: Normal range of motion. He exhibits no edema or deformity.  Neurological: He is alert and oriented to person, place, and time. He exhibits normal muscle tone. Coordination normal.  Skin: Skin is warm and dry. No pallor.  Psychiatric: He has a normal mood and affect. His behavior is normal.  Nursing note and vitals reviewed.  ED Treatments / Results  DIAGNOSTIC STUDIES: Oxygen Saturation is 100% on RA, normal by my interpretation.   COORDINATION OF CARE: 4:01 AM-Discussed next steps with pt. Pt verbalized understanding and is  agreeable with the plan.   Labs (all labs ordered are listed, but only abnormal results are displayed) Labs Reviewed  COMPREHENSIVE METABOLIC PANEL - Abnormal; Notable for the following:       Result Value   Chloride 100 (*)    Glucose, Bld 332 (*)    Total Protein 6.2 (*)    ALT 15 (*)    All other components within normal limits  URINALYSIS, ROUTINE W REFLEX MICROSCOPIC - Abnormal; Notable for the following:    Glucose, UA >1000 (*)    Ketones, ur 15 (*)    Squamous Epithelial / LPF 0-5 (*)    All other components within normal limits  CBG MONITORING, ED - Abnormal; Notable for the following:    Glucose-Capillary 333 (*)    All other components within normal limits  CBG MONITORING, ED - Abnormal; Notable for the  following:    Glucose-Capillary 236 (*)    All other components within normal limits  CBC WITH DIFFERENTIAL/PLATELET   EKG  EKG Interpretation None      Radiology No results found.  Procedures Procedures   Medications Ordered in ED Medications - No data to display  Initial Impression / Assessment and Plan / ED Course  I have reviewed the triage vital signs and the nursing notes.  Pertinent labs & imaging results that were available during my care of the patient were reviewed by me and considered in my medical decision making (see chart for details).  Clinical Course      Final Clinical Impressions(s) / ED Diagnoses   Final diagnoses:  Hyperglycemia  Noncompliance with medication treatment due to difficulty with route of medication administration  Patient has no acute complaints today. He reports he has problems taking his diabetic medications. He does not endorse any other associated symptoms. Review of labs indicate his blood counts are stable. Patient does not show signs of acidosis or acute medical complications. Reviewed EMR indicates problems with medication compliance. Consult has been placed to case manager to try to assist patient in follow-up  and management of outpatient health care. New Prescriptions Discharge Medication List as of 12/05/2016  6:47 AM        Arby Barrette, MD 12/05/16 [phone]

## 2016-12-05 NOTE — ED Triage Notes (Signed)
[facility] reports elevated blood sugar 434 this evening , he received NS 500 ml IV NS bolus by EMS , CBG= 333 at triage , alert and oriented/respirations unlabored , denies fever or chills . Pt. stated feeling " tired" / fatigue.

## 2016-12-05 NOTE — ED Notes (Signed)
Pt discharged to the waiting room for case management consult. Pt was ambulatory and gait normal. Pt instructed to wait for case management consult. Bus ticket provided

## 2016-12-05 NOTE — ED Notes (Signed)
Pt resting quietly with eyes closed. NAD.

## 2016-12-05 NOTE — ED Notes (Signed)
Pt reports he's supposed to be taking insulin injections but he doesn't know how to draw up the medications and that's why his blood sugar has been running high. Pt lethargic, has taken nighttime medications prior to coming into ED

## 2016-12-14 ENCOUNTER — Ambulatory Visit (HOSPITAL_COMMUNITY)
Admission: EM | Admit: 2016-12-14 | Discharge: 2016-12-14 | Disposition: A | Discharge status: Home / Self Care | Payer: Medicaid Other | Attending: Family Medicine | Admitting: Family Medicine

## 2016-12-14 ENCOUNTER — Encounter (HOSPITAL_COMMUNITY): Payer: Self-pay | Admitting: Family Medicine

## 2016-12-14 DIAGNOSIS — J40 Bronchitis, not specified as acute or chronic: Secondary | ICD-10-CM | POA: Diagnosis not present

## 2016-12-14 DIAGNOSIS — F201 Disorganized schizophrenia: Secondary | ICD-10-CM

## 2016-12-14 DIAGNOSIS — I10 Essential (primary) hypertension: Secondary | ICD-10-CM | POA: Diagnosis not present

## 2016-12-14 DIAGNOSIS — E1142 Type 2 diabetes mellitus with diabetic polyneuropathy: Secondary | ICD-10-CM

## 2016-12-14 DIAGNOSIS — Z760 Encounter for issue of repeat prescription: Secondary | ICD-10-CM

## 2016-12-14 MED ORDER — ALBUTEROL SULFATE HFA 108 (90 BASE) MCG/ACT IN AERS: 2.0000 | INHALATION_SPRAY | RESPIRATORY_TRACT | 5 refills | Status: DC | PRN

## 2016-12-14 MED ORDER — AMLODIPINE BESYLATE 5 MG PO TABS: 5.0000 mg | ORAL_TABLET | Freq: Every day | ORAL | 3 refills | Status: DC

## 2016-12-14 MED ORDER — DIVALPROEX SODIUM 500 MG PO DR TAB: 500.0000 mg | DELAYED_RELEASE_TABLET | Freq: Two times a day (BID) | ORAL | 3 refills | Status: DC

## 2016-12-14 MED ORDER — ATORVASTATIN CALCIUM 20 MG PO TABS: 20.0000 mg | ORAL_TABLET | Freq: Every day | ORAL | 3 refills | Status: DC

## 2016-12-14 MED ORDER — LINAGLIPTIN 5 MG PO TABS: 5.0000 mg | ORAL_TABLET | Freq: Every day | ORAL | 3 refills | Status: DC

## 2016-12-14 MED ORDER — GABAPENTIN 300 MG PO CAPS: 600.0000 mg | ORAL_CAPSULE | Freq: Three times a day (TID) | ORAL | 3 refills | Status: DC

## 2016-12-14 MED ORDER — PANTOPRAZOLE SODIUM 40 MG PO TBEC: 40.0000 mg | DELAYED_RELEASE_TABLET | Freq: Every day | ORAL | 3 refills | Status: DC

## 2016-12-14 MED ORDER — METFORMIN HCL 1000 MG PO TABS: 1000.0000 mg | ORAL_TABLET | Freq: Two times a day (BID) | ORAL | 1 refills | Status: DC

## 2016-12-14 MED ORDER — HYDROCHLOROTHIAZIDE 25 MG PO TABS: 25.0000 mg | ORAL_TABLET | Freq: Every day | ORAL | 3 refills | Status: DC

## 2016-12-14 NOTE — ED Provider Notes (Signed)
[facility]    CSN: [id number] Arrival date & time: 12/14/16  1006     History   Chief Complaint Chief Complaint  Patient presents with  . Medication Refill    HPI Dylan Talmadge [place] is a 55 y.o. male.   A 55 year old schizophrenic individual with diabetes and hypertension. He has had a bad cough for about a month as well. He was unable to afford his medications when he went to the emergency room and is run out of his diabetes and hypertension medicine. He gets his mental health medicine from mental health. [facility]. He was sent down here for refill because he had no place to live there.      Past Medical History:  Diagnosis Date  . Diabetes mellitus   . Diabetic neuropathy (HCC)   . High cholesterol   . Hypertension   . Schizophrenia [facility](HCC)     Patient Active Problem List   Diagnosis Date Noted  . Lobar pneumonia (HCC) 11/10/2016  . Non-insulin treated type 2 diabetes mellitus (HCC) 11/10/2016  . Chronic paranoid schizophrenia (HCC) 11/10/2016  . Sepsis (HCC) 11/10/2016  . Diabetes mellitus (HCC) 10/30/2016  . Essential hypertension 10/30/2016    Past Surgical History:  Procedure Laterality Date  . TONSILLECTOMY         Home Medications    Prior to Admission medications   Medication Sig Start Date End Date Taking? Authorizing Provider  albuterol (PROVENTIL HFA;VENTOLIN HFA) 108 (90 Base) MCG/ACT inhaler Inhale 2 puffs into the lungs every 4 (four) hours as needed for wheezing or shortness of breath (cough). 12/14/16   Elvina SidleKurt Cyleigh Massaro, MD  amLODipine (NORVASC) 5 MG tablet Take 1 tablet (5 mg total) by mouth daily. 12/14/16   Elvina SidleKurt Alquan Morrish, MD  atorvastatin (LIPITOR) 20 MG tablet Take 1 tablet (20 mg total) by mouth daily. Reported on 05/01/2016 12/14/16   Elvina SidleKurt Makenzye Troutman, MD  divalproex (DEPAKOTE) 500 MG DR tablet Take 1 tablet (500 mg total) by mouth 2 (two) times daily. 12/14/16   Elvina SidleKurt Zaryiah Barz, MD  gabapentin  (NEURONTIN) 300 MG capsule Take 2 capsules (600 mg total) by mouth 3 (three) times daily. 12/14/16   Elvina SidleKurt Fredda Clarida, MD  hydrochlorothiazide (HYDRODIURIL) 25 MG tablet Take 1 tablet (25 mg total) by mouth daily. 12/14/16   Elvina SidleKurt Agamjot Kilgallon, MD  linagliptin (TRADJENTA) 5 MG TABS tablet Take 1 tablet (5 mg total) by mouth daily. 12/14/16   Elvina SidleKurt Scout Guyett, MD  metFORMIN (GLUCOPHAGE) 1000 MG tablet Take 1 tablet (1,000 mg total) by mouth 2 (two) times daily with a meal. 12/14/16   Elvina SidleKurt Jenae Tomasello, MD  pantoprazole (PROTONIX) 40 MG tablet Take 1 tablet (40 mg total) by mouth daily. 12/14/16   Elvina SidleKurt Dominga Mcduffie, MD  trihexyphenidyl (ARTANE) 2 MG tablet Take 1 tablet (2 mg total) by mouth at bedtime. 11/05/16   Oneta Rackanika N Lewis, NP  ziprasidone (GEODON) 80 MG capsule Take 1 capsule (80 mg total) by mouth 2 (two) times daily with a meal. 11/05/16   Oneta Rackanika N Lewis, NP    Family History Family History  Problem Relation Age of Onset  . Hypertension Mother   . Cancer Father   . Alcoholism Other     Social History Social History  Substance Use Topics  . Smoking status: Current Every Day Smoker    Packs/day: 0.50    Types: Cigarettes  . Smokeless tobacco: Never Used  . Alcohol use No     Allergies   Benadryl [diphenhydramine hcl];  Diphenhydramine; Haldol [haloperidol lactate]; Haldol [haloperidol]; Lisinopril; Thorazine [chlorpromazine]; and Thorazine [chlorpromazine]   Review of Systems Review of Systems  Constitutional: Negative.   HENT: Negative.   Respiratory: Positive for cough.   Neurological: Positive for numbness.  Psychiatric/Behavioral: Positive for dysphoric mood. The patient is nervous/anxious.      Physical Exam Triage Vital Signs ED Triage Vitals [12/14/16 1017]  Enc Vitals Group     BP 151/94     Pulse Rate 66     Resp 12     Temp 98 F (36.7 C)     Temp Source Oral     SpO2 98 %     Weight      Height      Head Circumference      Peak Flow      Pain Score       Pain Loc      Pain Edu?      Excl. in GC?    No data found.   Updated Vital Signs BP 151/94 (BP Location: Left Arm)   Pulse 66   Temp 98 F (36.7 C) (Oral)   Resp 12   SpO2 98%    Physical Exam  Constitutional: He is oriented to person, place, and time. He appears well-developed and well-nourished.  HENT:  Head: Normocephalic.  Right Ear: External ear normal.  Left Ear: External ear normal.  Mouth/Throat: Oropharynx is clear and moist.  Eyes: Conjunctivae and EOM are normal.  Neck: Normal range of motion. Neck supple.  Cardiovascular: Normal rate, regular rhythm and normal heart sounds.   Pulmonary/Chest: Effort normal. He has wheezes.  Musculoskeletal: Normal range of motion.  Neurological: He is alert and oriented to person, place, and time.  Skin: Skin is warm and dry.  Nursing note and vitals reviewed.    UC Treatments / Results  Labs (all labs ordered are listed, but only abnormal results are displayed) Labs Reviewed - No data to display  EKG  EKG Interpretation None       Radiology No results found.  Procedures Procedures (including critical care time)  Medications Ordered in UC Medications - No data to display   Initial Impression / Assessment and Plan / UC Course  I have reviewed the triage vital signs and the nursing notes.  Pertinent labs & imaging results that were available during my care of the patient were reviewed by me and considered in my medical decision making (see chart for details).  Clinical Course     Final Clinical Impressions(s) / UC Diagnoses   Final diagnoses:  Medication refill  Bronchitis  Type 2 diabetes, controlled, with peripheral neuropathy (HCC)  Hypertension, essential  Disorganized schizophrenia (HCC)    New Prescriptions Current Discharge Medication List    Meds refilled. Patient will follow-up with mental health and find a primary care doctor to continue his routine care.   Elvina SidleKurt Joselin Crandell,  MD 12/14/16 [phone]

## 2016-12-14 NOTE — ED Triage Notes (Signed)
Pt here for refill on all of his meds.

## 2016-12-16 ENCOUNTER — Emergency Department (HOSPITAL_COMMUNITY): Payer: Medicaid Other

## 2016-12-16 ENCOUNTER — Emergency Department (HOSPITAL_COMMUNITY)
Admission: EM | Admit: 2016-12-16 | Discharge: 2016-12-16 | Disposition: A | Discharge status: Home / Self Care | Payer: Medicaid Other | Attending: Emergency Medicine | Admitting: Emergency Medicine

## 2016-12-16 ENCOUNTER — Encounter (HOSPITAL_COMMUNITY): Payer: Self-pay | Admitting: *Deleted

## 2016-12-16 DIAGNOSIS — Z7984 Long term (current) use of oral hypoglycemic drugs: Secondary | ICD-10-CM | POA: Insufficient documentation

## 2016-12-16 DIAGNOSIS — Z79899 Other long term (current) drug therapy: Secondary | ICD-10-CM | POA: Insufficient documentation

## 2016-12-16 DIAGNOSIS — F17210 Nicotine dependence, cigarettes, uncomplicated: Secondary | ICD-10-CM | POA: Insufficient documentation

## 2016-12-16 DIAGNOSIS — R05 Cough: Secondary | ICD-10-CM | POA: Diagnosis present

## 2016-12-16 DIAGNOSIS — E1140 Type 2 diabetes mellitus with diabetic neuropathy, unspecified: Secondary | ICD-10-CM | POA: Diagnosis not present

## 2016-12-16 DIAGNOSIS — I10 Essential (primary) hypertension: Secondary | ICD-10-CM | POA: Insufficient documentation

## 2016-12-16 DIAGNOSIS — R059 Cough, unspecified: Secondary | ICD-10-CM

## 2016-12-16 LAB — BASIC METABOLIC PANEL
ANION GAP: 11 (ref 5–15)
BUN: 15 mg/dL (ref 6–20)
CHLORIDE: 99 mmol/L — AB (ref 101–111)
CO2: 24 mmol/L (ref 22–32)
Calcium: 8.7 mg/dL — ABNORMAL LOW (ref 8.9–10.3)
Creatinine, Ser: 1.15 mg/dL (ref 0.61–1.24)
GFR calc non Af Amer: >60 mL/min (ref >60)
GLUCOSE: 365 mg/dL — AB (ref 65–99)
Potassium: 4.1 mmol/L (ref 3.5–5.1)
Sodium: 134 mmol/L — ABNORMAL LOW (ref 135–145)

## 2016-12-16 MED ORDER — BENZONATATE 100 MG PO CAPS: 100.0000 mg | ORAL_CAPSULE | Freq: Three times a day (TID) | ORAL | 0 refills | Status: DC | PRN

## 2016-12-16 NOTE — ED Triage Notes (Signed)
The [facility] with abd pAIN  HE REFUSED TO LET EMS START A IV OR DO ANYHTING ELSE FOR JHIM.   He did not want us to touch him in triage either

## 2016-12-16 NOTE — ED Provider Notes (Signed)
[facility]-EMERGENCY DEPT Provider Note   CSN: [id number] Arrival date & time: 12/16/16  0130     History   Chief Complaint Chief Complaint  Patient presents with  . Cough    HPI [name] is a 55 y.o. male.  Patient with concern for cough he has had for the past 3 weeks. No known fever. No vomiting. He has been seen by Urgent Care for same. No pain. The patient is reluctant to discuss history so more detail is not known.    The history is provided by the patient. No language interpreter was used.  Cough  Pertinent negatives include no chest pain and no chills.    Past Medical History:  Diagnosis Date  . Diabetes mellitus   . Diabetic neuropathy (HCC)   . High cholesterol   . Hypertension   . Schizophrenia [facility])     Patient Active Problem List   Diagnosis Date Noted  . Lobar pneumonia (HCC) 11/10/2016  . Non-insulin treated type 2 diabetes mellitus (HCC) 11/10/2016  . Chronic paranoid schizophrenia (HCC) 11/10/2016  . Sepsis (HCC) 11/10/2016  . Diabetes mellitus (HCC) 10/30/2016  . Essential hypertension 10/30/2016    Past Surgical History:  Procedure Laterality Date  . TONSILLECTOMY         Home Medications    Prior to Admission medications   Medication Sig Start Date End Date Taking? Authorizing Provider  albuterol (PROVENTIL HFA;VENTOLIN HFA) 108 (90 Base) MCG/ACT inhaler Inhale 2 puffs into the lungs every 4 (four) hours as needed for wheezing or shortness of breath (cough). 12/14/16   Elvina Sidle, MD  amLODipine (NORVASC) 5 MG tablet Take 1 tablet (5 mg total) by mouth daily. 12/14/16   Elvina Sidle, MD  atorvastatin (LIPITOR) 20 MG tablet Take 1 tablet (20 mg total) by mouth daily. Reported on 05/01/2016 12/14/16   Elvina Sidle, MD  divalproex (DEPAKOTE) 500 MG DR tablet Take 1 tablet (500 mg total) by mouth 2 (two) times daily. 12/14/16   Elvina Sidle, MD  gabapentin (NEURONTIN) 300 MG capsule Take 2 capsules (600 mg total) by mouth 3  (three) times daily. 12/14/16   Elvina Sidle, MD  hydrochlorothiazide (HYDRODIURIL) 25 MG tablet Take 1 tablet (25 mg total) by mouth daily. 12/14/16   Elvina Sidle, MD  linagliptin (TRADJENTA) 5 MG TABS tablet Take 1 tablet (5 mg total) by mouth daily. 12/14/16   Elvina Sidle, MD  metFORMIN (GLUCOPHAGE) 1000 MG tablet Take 1 tablet (1,000 mg total) by mouth 2 (two) times daily with a meal. 12/14/16   Elvina Sidle, MD  pantoprazole (PROTONIX) 40 MG tablet Take 1 tablet (40 mg total) by mouth daily. 12/14/16   Elvina Sidle, MD  trihexyphenidyl (ARTANE) 2 MG tablet Take 1 tablet (2 mg total) by mouth at bedtime. 11/05/16   Oneta Rack, NP  ziprasidone (GEODON) 80 MG capsule Take 1 capsule (80 mg total) by mouth 2 (two) times daily with a meal. 11/05/16   Oneta Rack, NP    Family History Family History  Problem Relation Age of Onset  . Hypertension Mother   . Cancer Father   . Alcoholism Other     Social History Social History  Substance Use Topics  . Smoking status: Current Every Day Smoker    Packs/day: 0.50    Types: Cigarettes  . Smokeless tobacco: Never Used  . Alcohol use No     Allergies   Benadryl [diphenhydramine hcl]; Diphenhydramine; Haldol [haloperidol lactate]; Haldol [haloperidol]; Lisinopril; Thorazine [chlorpromazine]; and  Thorazine [chlorpromazine]   Review of Systems Review of Systems  Constitutional: Negative for chills and fever.  Respiratory: Positive for cough.   Cardiovascular: Negative.  Negative for chest pain.  Gastrointestinal: Negative.  Negative for vomiting.     Physical Exam Updated Vital Signs BP (!) 209/183 (BP Location: Left Arm)   Pulse 103   Temp 98.2 F (36.8 C) (Oral)   Resp 18   SpO2 98%   Physical Exam  Constitutional: He is oriented to person, place, and time. He appears well-developed and well-nourished.  Neck: Normal range of motion.  Cardiovascular: Normal rate.   Pulmonary/Chest: Effort normal.    Musculoskeletal: Normal range of motion.  Neurological: He is alert and oriented to person, place, and time.  Skin: Skin is warm and dry.  Psychiatric: He has a normal mood and affect.     ED Treatments / Results  Labs (all labs ordered are listed, but only abnormal results are displayed) Labs Reviewed  BASIC METABOLIC PANEL - Abnormal; Notable for the following:       Result Value   Sodium 134 (*)    Chloride 99 (*)    Glucose, Bld 365 (*)    Calcium 8.7 (*)    All other components within normal limits   Results for orders placed or performed during the hospital encounter of 12/16/16  Basic metabolic panel  Result Value Ref Range   Sodium 134 (L) 135 - 145 mmol/L   Potassium 4.1 3.5 - 5.1 mmol/L   Chloride 99 (L) 101 - 111 mmol/L   CO2 24 22 - 32 mmol/L   Glucose, Bld 365 (H) 65 - 99 mg/dL   BUN 15 6 - 20 mg/dL   Creatinine, Ser 1.611.15 0.61 - 1.24 mg/dL   Calcium 8.7 (L) 8.9 - 10.3 mg/dL   GFR calc non Af Amer >60 >60 mL/min   GFR calc Af Amer >60 >60 mL/min   Anion gap 11 5 - 15    EKG  EKG Interpretation None       Radiology Dg Chest 2 View  Result Date: 12/16/2016 CLINICAL DATA:  Cough for 3 weeks. EXAM: CHEST  2 VIEW COMPARISON:  11/10/2016 FINDINGS: The heart size and mediastinal contours are within normal limits. Both lungs are clear. The visualized skeletal structures are unremarkable. IMPRESSION: No active cardiopulmonary disease. Electronically Signed   By: Ellery Plunkaniel R Mitchell M.D.   On: 12/16/2016 02:52    Procedures Procedures (including critical care time)  Medications Ordered in ED Medications - No data to display   Initial Impression / Assessment and Plan / ED Course  I have reviewed the triage vital signs and the nursing notes.  Pertinent labs & imaging results that were available during my care of the patient were reviewed by me and considered in my medical decision making (see chart for details).  Clinical Course    The patient arrives by  EMS but will not allow IV start and becomes easily agitated. He relays he has a cough that won't go away. VSS, no hypoxia. His CBG is elevated to 365 which is average for the past several visits. No evidence of acidosis.   He can be discharged home. He is encouraged to take his medications for chronic conditions. Will provide Tessalon for cough. REferred back to primary care.   Final Clinical Impressions(s) / ED Diagnoses   Final diagnoses:  None   1. Cough  New Prescriptions New Prescriptions   No medications on file  Elpidio AnisShari Kaloni Bisaillon, PA-C 12/16/16 0700    Dione Boozeavid Glick, MD 12/16/16 [id number]

## 2016-12-16 NOTE — ED Triage Notes (Signed)
The pt is c/o a cough for 3 weeks  He is homeless  No pain coughing onluy

## 2016-12-25 ENCOUNTER — Encounter: Payer: Self-pay | Admitting: Pediatric Intensive Care

## 2017-01-31 NOTE — Congregational Nurse Program (Signed)
Congregational Nurse Program Note  Date of Encounter: 12/25/2016  Past Medical History: Past Medical History:  Diagnosis Date  . Diabetes mellitus   . Diabetic neuropathy (HCC)   . High cholesterol   . Hypertension   . Schizophrenia (HCC)     Encounter Details:  Client c/o left great toe pain. No sign of infection noted. [facility] as he won't be able to get in to see his PCP. Bus passes given.

## 2017-04-12 ENCOUNTER — Other Ambulatory Visit: Payer: Self-pay | Admitting: Family Medicine

## 2017-04-24 ENCOUNTER — Other Ambulatory Visit: Payer: Self-pay | Admitting: Family Medicine

## 2017-05-31 ENCOUNTER — Emergency Department (HOSPITAL_COMMUNITY)
Admission: EM | Admit: 2017-05-31 | Discharge: 2017-05-31 | Disposition: A | Discharge status: Home / Self Care | Payer: Medicaid Other | Attending: Emergency Medicine | Admitting: Emergency Medicine

## 2017-05-31 DIAGNOSIS — R739 Hyperglycemia, unspecified: Secondary | ICD-10-CM

## 2017-05-31 DIAGNOSIS — Z7984 Long term (current) use of oral hypoglycemic drugs: Secondary | ICD-10-CM | POA: Insufficient documentation

## 2017-05-31 DIAGNOSIS — R45851 Suicidal ideations: Secondary | ICD-10-CM

## 2017-05-31 DIAGNOSIS — E1165 Type 2 diabetes mellitus with hyperglycemia: Secondary | ICD-10-CM | POA: Diagnosis not present

## 2017-05-31 DIAGNOSIS — Z79899 Other long term (current) drug therapy: Secondary | ICD-10-CM | POA: Insufficient documentation

## 2017-05-31 DIAGNOSIS — I10 Essential (primary) hypertension: Secondary | ICD-10-CM | POA: Diagnosis not present

## 2017-05-31 DIAGNOSIS — F17210 Nicotine dependence, cigarettes, uncomplicated: Secondary | ICD-10-CM | POA: Insufficient documentation

## 2017-05-31 LAB — CBC
HEMATOCRIT: 40.1 % (ref 39.0–52.0)
Hemoglobin: 13.8 g/dL (ref 13.0–17.0)
MCH: 29.4 pg (ref 26.0–34.0)
MCHC: 34.4 g/dL (ref 30.0–36.0)
MCV: 85.5 fL (ref 78.0–100.0)
PLATELETS: 141 10*3/uL — AB (ref 150–400)
RBC: 4.69 MIL/uL (ref 4.22–5.81)
RDW: 12.8 % (ref 11.5–15.5)
WBC: 8.8 10*3/uL (ref 4.0–10.5)

## 2017-05-31 LAB — ACETAMINOPHEN LEVEL: Acetaminophen (Tylenol), Serum: <10 ug/mL — ABNORMAL LOW (ref 10–30)

## 2017-05-31 LAB — COMPREHENSIVE METABOLIC PANEL
ALBUMIN: 3.8 g/dL (ref 3.5–5.0)
ALK PHOS: 51 U/L (ref 38–126)
ALT: 14 U/L — AB (ref 17–63)
AST: 16 U/L (ref 15–41)
Anion gap: 8 (ref 5–15)
BILIRUBIN TOTAL: 0.6 mg/dL (ref 0.3–1.2)
BUN: 15 mg/dL (ref 6–20)
CO2: 29 mmol/L (ref 22–32)
CREATININE: 1.05 mg/dL (ref 0.61–1.24)
Calcium: 9.3 mg/dL (ref 8.9–10.3)
Chloride: 99 mmol/L — ABNORMAL LOW (ref 101–111)
GFR calc Af Amer: >60 mL/min (ref >60)
GLUCOSE: 262 mg/dL — AB (ref 65–99)
Potassium: 3.7 mmol/L (ref 3.5–5.1)
Sodium: 136 mmol/L (ref 135–145)
TOTAL PROTEIN: 7 g/dL (ref 6.5–8.1)

## 2017-05-31 LAB — SALICYLATE LEVEL: Salicylate Lvl: <7 mg/dL (ref 2.8–30.0)

## 2017-05-31 LAB — CBG MONITORING, ED: Glucose-Capillary: 161 mg/dL — ABNORMAL HIGH (ref 65–99)

## 2017-05-31 LAB — ETHANOL

## 2017-05-31 NOTE — ED Triage Notes (Signed)
Patient also states that he has not slept for 3 days

## 2017-05-31 NOTE — ED Triage Notes (Signed)
Patient reports that he is homeless and staying  with daughter and they smoke pot, play loud music and that started to bother him, and made him think about hurting self. States " My plan is to take an overdose of medications, and he reports that he has attempted suicide before with a razor blade.

## 2017-05-31 NOTE — ED Notes (Signed)
Patient denies pain and is resting comfortably.  

## 2017-05-31 NOTE — ED Notes (Signed)
Patient wanded placed in purple scrubs.

## 2017-05-31 NOTE — ED Provider Notes (Signed)
[facility]-EMERGENCY DEPT Provider Note   CSN: [id number] Arrival date & time: 05/31/17  1706     History   Chief Complaint Chief Complaint  Patient presents with  . Suicidal    HPI [name] is a 56 y.o. male.  The history is provided by the patient.  Mental Health Problem  Presenting symptoms: suicidal thoughts   Duration:  5 hours Timing:  Sporadic Progression:  Resolved Chronicity:  Recurrent   Patient reports that he is homeless and staying  with daughter and they smoke pot, play loud music and that started to bother him, and made him think about hurting self. States " My plan is to take an overdose of medications (Metformin)," and he reports that he has attempted suicide before with a razor blade.  Currently states that he is no longer suicidal. No HI or AVH. He states: "When my sugar is up I get to acting like this."   Pt is asking for a place to stay.    Past Medical History:  Diagnosis Date  . Diabetes mellitus   . Diabetic neuropathy (HCC)   . High cholesterol   . Hypertension   . Schizophrenia [facility])     Patient Active Problem List   Diagnosis Date Noted  . Lobar pneumonia (HCC) 11/10/2016  . Non-insulin treated type 2 diabetes mellitus (HCC) 11/10/2016  . Chronic paranoid schizophrenia (HCC) 11/10/2016  . Sepsis (HCC) 11/10/2016  . Diabetes mellitus (HCC) 10/30/2016  . Essential hypertension 10/30/2016    Past Surgical History:  Procedure Laterality Date  . TONSILLECTOMY         Home Medications    Prior to Admission medications   Medication Sig Start Date End Date Taking? Authorizing Provider  albuterol (PROVENTIL HFA;VENTOLIN HFA) 108 (90 Base) MCG/ACT inhaler Inhale 2 puffs into the lungs every 4 (four) hours as needed for wheezing or shortness of breath (cough). 12/14/16   Elvina Sidle, MD  amLODipine (NORVASC) 5 MG tablet Take 1 tablet (5 mg total) by mouth daily. 12/14/16   Elvina Sidle, MD  atorvastatin (LIPITOR) 20 MG  tablet Take 1 tablet (20 mg total) by mouth daily. Reported on 05/01/2016 12/14/16   Elvina Sidle, MD  benzonatate (TESSALON PERLES) 100 MG capsule Take 1 capsule (100 mg total) by mouth 3 (three) times daily as needed for cough. 12/16/16   Elpidio Anis, PA-C  divalproex (DEPAKOTE) 500 MG DR tablet Take 1 tablet (500 mg total) by mouth 2 (two) times daily. 12/14/16   Elvina Sidle, MD  gabapentin (NEURONTIN) 300 MG capsule Take 2 capsules (600 mg total) by mouth 3 (three) times daily. 12/14/16   Elvina Sidle, MD  hydrochlorothiazide (HYDRODIURIL) 25 MG tablet Take 1 tablet (25 mg total) by mouth daily. 12/14/16   Elvina Sidle, MD  linagliptin (TRADJENTA) 5 MG TABS tablet Take 1 tablet (5 mg total) by mouth daily. 12/14/16   Elvina Sidle, MD  metFORMIN (GLUCOPHAGE) 1000 MG tablet Take 1 tablet (1,000 mg total) by mouth 2 (two) times daily with a meal. 12/14/16   Elvina Sidle, MD  pantoprazole (PROTONIX) 40 MG tablet Take 1 tablet (40 mg total) by mouth daily. 12/14/16   Elvina Sidle, MD  trihexyphenidyl (ARTANE) 2 MG tablet Take 1 tablet (2 mg total) by mouth at bedtime. 11/05/16   Oneta Rack, NP  ziprasidone (GEODON) 80 MG capsule Take 1 capsule (80 mg total) by mouth 2 (two) times daily with a meal. 11/05/16   Oneta Rack, NP  Family History Family History  Problem Relation Age of Onset  . Hypertension Mother   . Cancer Father   . Alcoholism Other     Social History Social History  Substance Use Topics  . Smoking status: Current Every Day Smoker    Packs/day: 0.50    Types: Cigarettes  . Smokeless tobacco: Never Used  . Alcohol use No     Allergies   Benadryl [diphenhydramine hcl]; Diphenhydramine; Haldol [haloperidol lactate]; Haldol [haloperidol]; Lisinopril; Thorazine [chlorpromazine]; and Thorazine [chlorpromazine]   Review of Systems Review of Systems  Psychiatric/Behavioral: Positive for suicidal ideas.  All other systems are reviewed  and are negative for acute change except as noted in the HPI    Physical Exam Updated Vital Signs BP 134/77 (BP Location: Left Arm)   Pulse 66   Temp 97.9 F (36.6 C) (Oral)   Resp 16   Ht 6\' 1"  (1.854 m)   Wt 86.2 kg (190 lb)   SpO2 97%   BMI 25.07 kg/m   Physical Exam  Constitutional: He is oriented to person, place, and time. He appears well-developed and well-nourished. No distress.  HENT:  Head: Normocephalic and atraumatic.  Right Ear: External ear normal.  Left Ear: External ear normal.  Nose: Nose normal.  Mouth/Throat: Mucous membranes are normal. No trismus in the jaw.  Eyes: Conjunctivae and EOM are normal. No scleral icterus.  Neck: Normal range of motion and phonation normal.  Cardiovascular: Normal rate and regular rhythm.   Pulmonary/Chest: Effort normal. No stridor. No respiratory distress.  Abdominal: He exhibits no distension.  Musculoskeletal: Normal range of motion. He exhibits no edema.  Neurological: He is alert and oriented to person, place, and time.  Skin: He is not diaphoretic.  Psychiatric: He has a normal mood and affect. His behavior is normal.  Vitals reviewed.    ED Treatments / Results  Labs (all labs ordered are listed, but only abnormal results are displayed) Labs Reviewed  COMPREHENSIVE METABOLIC PANEL - Abnormal; Notable for the following:       Result Value   Chloride 99 (*)    Glucose, Bld 262 (*)    ALT 14 (*)    All other components within normal limits  ACETAMINOPHEN LEVEL - Abnormal; Notable for the following:    Acetaminophen (Tylenol), Serum <10 (*)    All other components within normal limits  CBC - Abnormal; Notable for the following:    Platelets 141 (*)    All other components within normal limits  CBG MONITORING, ED - Abnormal; Notable for the following:    Glucose-Capillary 161 (*)    All other components within normal limits  ETHANOL  SALICYLATE LEVEL  RAPID URINE DRUG SCREEN, HOSP PERFORMED    EKG  EKG  Interpretation None       Radiology No results found.  Procedures Procedures (including critical care time)  Medications Ordered in ED Medications - No data to display   Initial Impression / Assessment and Plan / ED Course  I have reviewed the triage vital signs and the nursing notes.  Pertinent labs & imaging results that were available during my care of the patient were reviewed by me and considered in my medical decision making (see chart for details).     Fleeting suicidal ideations due to stressful situation. Patient is currently no longer suicidal. No HI or AVH. Patient states that he is able to contract for safety and will touch base with his acting to set up an appointment  with his psychiatrist.  Given the patient's history of hyperglycemia. Screening labs were obtained which did reveal hyperglycemia but no evidence of DKA or HHS.  The patient is safe for discharge with strict return precautions.   Final Clinical Impressions(s) / ED Diagnoses   Final diagnoses:  Suicidal thoughts  Hyperglycemia   Disposition: Discharge  Condition: Good  I have discussed the results, Dx and Tx plan with the patient who expressed understanding and agree(s) with the plan. Discharge instructions discussed at great length. The patient was given strict return precautions who verbalized understanding of the instructions. No further questions at time of discharge.    New Prescriptions   No medications on file    Follow Up: Oval Linsey, MD [address] [place] [address] [phone]  Schedule an appointment as soon as possible for a visit  As needed  ACT team  Schedule an appointment as soon as possible for a visit        Cardama, Amadeo Garnet, MD 05/31/17 2257

## 2017-05-31 NOTE — ED Notes (Signed)
ED Provider at bedside. 

## 2017-12-10 ENCOUNTER — Ambulatory Visit (INDEPENDENT_AMBULATORY_CARE_PROVIDER_SITE_OTHER): Payer: Medicaid Other | Admitting: Sports Medicine

## 2017-12-10 ENCOUNTER — Ambulatory Visit (INDEPENDENT_AMBULATORY_CARE_PROVIDER_SITE_OTHER): Payer: Medicaid Other

## 2017-12-10 ENCOUNTER — Encounter: Payer: Self-pay | Admitting: Sports Medicine

## 2017-12-10 ENCOUNTER — Other Ambulatory Visit: Payer: Self-pay | Admitting: Sports Medicine

## 2017-12-10 VITALS — BP 121/77 | HR 66 | Resp 16

## 2017-12-10 DIAGNOSIS — M779 Enthesopathy, unspecified: Secondary | ICD-10-CM

## 2017-12-10 DIAGNOSIS — M7752 Other enthesopathy of left foot: Secondary | ICD-10-CM | POA: Diagnosis not present

## 2017-12-10 DIAGNOSIS — M79672 Pain in left foot: Secondary | ICD-10-CM

## 2017-12-10 DIAGNOSIS — M19072 Primary osteoarthritis, left ankle and foot: Secondary | ICD-10-CM | POA: Diagnosis not present

## 2017-12-10 DIAGNOSIS — B351 Tinea unguium: Secondary | ICD-10-CM | POA: Diagnosis not present

## 2017-12-10 DIAGNOSIS — M79676 Pain in unspecified toe(s): Secondary | ICD-10-CM

## 2017-12-10 DIAGNOSIS — F200 Paranoid schizophrenia: Secondary | ICD-10-CM | POA: Diagnosis not present

## 2017-12-10 DIAGNOSIS — E0800 Diabetes mellitus due to underlying condition with hyperosmolarity without nonketotic hyperglycemic-hyperosmolar coma (NKHHC): Secondary | ICD-10-CM

## 2017-12-10 DIAGNOSIS — M79674 Pain in right toe(s): Secondary | ICD-10-CM | POA: Diagnosis not present

## 2017-12-10 DIAGNOSIS — M79675 Pain in left toe(s): Secondary | ICD-10-CM

## 2017-12-10 MED ORDER — MELOXICAM 15 MG PO TABS: 15.0000 mg | ORAL_TABLET | Freq: Every day | ORAL | 0 refills | Status: DC

## 2017-12-10 NOTE — Addendum Note (Signed)
Addended by: Asencion IslamSTOVER, Ayanah Snader T on: 12/10/2017 10:09 PM   Modules accepted: Level of Service

## 2017-12-10 NOTE — Progress Notes (Signed)
   Subjective:    Patient ID: [name], male    DOB: [birth year], 56 y.o.   MRN: [id number]  HPI    Review of Systems  All other systems reviewed and are negative.      Objective:   Physical Exam        Assessment & Plan:

## 2017-12-10 NOTE — Progress Notes (Addendum)
Subjective: [name] is a 56 y.o. male patient with history of diabetes who presents to office today complaining of long, painful nails  while ambulating in shoes; unable to trim. Patient states that the glucose reading this morning was not checked, last A1C was 9.4 Patient denies any new changes in medication. Admits to new pain since Saturday at the top of left foot. Does not know what could of caused it. Reports that it hurts. Desires something for pain. Patient is assisted by Therapist, Hazle CocaShine who is present.  ROS All systems Negative   Patient Active Problem List   Diagnosis Date Noted  . Lobar pneumonia (HCC) 11/10/2016  . Non-insulin treated type 2 diabetes mellitus (HCC) 11/10/2016  . Chronic paranoid schizophrenia (HCC) 11/10/2016  . Sepsis (HCC) 11/10/2016  . Diabetes mellitus (HCC) 10/30/2016  . Essential hypertension 10/30/2016   Current Outpatient Medications on File Prior to Visit  Medication Sig Dispense Refill  . albuterol (PROVENTIL HFA;VENTOLIN HFA) 108 (90 Base) MCG/ACT inhaler Inhale 2 puffs into the lungs every 4 (four) hours as needed for wheezing or shortness of breath (cough). 1 Inhaler 5  . amLODipine (NORVASC) 5 MG tablet Take 1 tablet (5 mg total) by mouth daily. 90 tablet 3  . atorvastatin (LIPITOR) 20 MG tablet Take 1 tablet (20 mg total) by mouth daily. Reported on 05/01/2016 90 tablet 3  . benzonatate (TESSALON PERLES) 100 MG capsule Take 1 capsule (100 mg total) by mouth 3 (three) times daily as needed for cough. 20 capsule 0  . divalproex (DEPAKOTE) 500 MG DR tablet Take 1 tablet (500 mg total) by mouth 2 (two) times daily. 60 tablet 3  . gabapentin (NEURONTIN) 300 MG capsule Take 2 capsules (600 mg total) by mouth 3 (three) times daily. 90 capsule 3  . hydrochlorothiazide (HYDRODIURIL) 25 MG tablet Take 1 tablet (25 mg total) by mouth daily. 90 tablet 3  . linagliptin (TRADJENTA) 5 MG TABS tablet Take 1 tablet (5 mg total) by mouth daily. 30 tablet 3  .  metFORMIN (GLUCOPHAGE) 1000 MG tablet Take 1 tablet (1,000 mg total) by mouth 2 (two) times daily with a meal. 180 tablet 1  . pantoprazole (PROTONIX) 40 MG tablet Take 1 tablet (40 mg total) by mouth daily. 30 tablet 3  . trihexyphenidyl (ARTANE) 2 MG tablet Take 1 tablet (2 mg total) by mouth at bedtime. 30 tablet 0  . ziprasidone (GEODON) 80 MG capsule Take 1 capsule (80 mg total) by mouth 2 (two) times daily with a meal. 60 capsule 0   No current facility-administered medications on file prior to visit.    Allergies  Allergen Reactions  . Benadryl [Diphenhydramine Hcl]   . Diphenhydramine     GI upset, paradoxic agitation  . Haldol [Haloperidol Lactate]   . Haldol [Haloperidol]     "makes me tremble, bite my tongue"  . Lisinopril Swelling  . Thorazine [Chlorpromazine]     Tremble, bite my tongue  . Thorazine [Chlorpromazine]     No results found for this or any previous visit (from the past 2160 hour(s)).  Objective: General: Patient is awake, alert, and oriented x 3 and in no acute distress.  Integument: Skin is warm, dry and supple bilateral. Nails are tender, long, thickened and dystrophic with subungual debris, consistent with onychomycosis, 1-5 bilateral. No signs of infection. Areas of hyperpigmentation on L>R leg, Reports past history of snake bite. No open lesions or preulcerative lesions present bilateral. Remaining integument unremarkable.  Vasculature:  Dorsalis Pedis  pulse 1/4 bilateral. Posterior Tibial pulse  1/4 bilateral. Capillary fill time <3 sec 1-5 bilateral. Positive hair growth to the level of the digits.Temperature gradient within normal limits. No varicosities present bilateral. No edema present bilateral.   Neurology: The patient has intact sensation measured with a 5.07/10g Semmes Weinstein Monofilament at all pedal sites bilateral. Vibratory sensation diminished bilateral with tuning fork. No Babinski sign present bilateral.   Musculoskeletal: Pain with  palpation to dorsal lateral left foot, Asymptomatic pes planus pedal deformities noted bilateral. Muscular strength 5/5 in all lower extremity muscular groups bilateral with mild pain on range of motion on left. No tenderness with calf compression bilateral.  Assessment and Plan: Problem List Items Addressed This Visit      Endocrine   Diabetes mellitus (HCC) (Chronic)     Other   Chronic paranoid schizophrenia (HCC)    Other Visit Diagnoses    Foot pain, left    -  Primary   Tendonitis       Arthritis of foot, left       Relevant Medications   meloxicam (MOBIC) 15 MG tablet   Pain due to onychomycosis of toenails of both feet         -Examined patient. -Discussed and educated patient on diabetic foot care, especially with  regards to the vascular, neurological and musculoskeletal systems.  -Stressed the importance of good glycemic control and the detriment of not controlling glucose levels in relation to the foot. -Mechanically debrided all nails 1-5 bilateral using sterile nail nipper and filed with dremel without incident  -Xrays at Left foot midtarsal breach, with midfoot arthritis, no other acute findings.  -Patient refused steroid injection to left foot  -Rx Mobic -Answered all patient questions -Patient to return in 3 months for at risk foot care  -Patient advised to call the office if any problems or questions arise in the meantime.  Asencion Islamitorya Alpa Salvo, DPM

## 2018-03-12 ENCOUNTER — Ambulatory Visit: Payer: Medicaid Other | Admitting: Podiatry

## 2019-07-22 ENCOUNTER — Other Ambulatory Visit: Payer: Self-pay | Admitting: Critical Care Medicine

## 2019-07-22 ENCOUNTER — Encounter: Payer: Self-pay | Admitting: Critical Care Medicine

## 2019-07-22 DIAGNOSIS — B351 Tinea unguium: Secondary | ICD-10-CM

## 2019-07-22 MED ORDER — ATORVASTATIN CALCIUM 20 MG PO TABS: 20.0000 mg | ORAL_TABLET | Freq: Every day | ORAL | 3 refills | Status: DC

## 2019-07-22 MED ORDER — HYDROCHLOROTHIAZIDE 25 MG PO TABS: 25.0000 mg | ORAL_TABLET | Freq: Every day | ORAL | 3 refills | Status: DC

## 2019-07-22 MED ORDER — LANTUS SOLOSTAR 100 UNIT/ML ~~LOC~~ SOPN: 30.0000 [IU] | PEN_INJECTOR | Freq: Every day | SUBCUTANEOUS | 11 refills | Status: DC

## 2019-07-22 MED ORDER — AMLODIPINE BESYLATE 10 MG PO TABS: 10.0000 mg | ORAL_TABLET | Freq: Every day | ORAL | 3 refills | Status: DC

## 2019-07-22 MED ORDER — INSULIN PEN NEEDLE 29G X 5MM MISC: 0 refills | Status: DC

## 2019-07-22 MED ORDER — OLANZAPINE 15 MG PO TABS: 15.0000 mg | ORAL_TABLET | Freq: Every day | ORAL | 1 refills | Status: DC

## 2019-07-22 MED ORDER — ASPIRIN EC 81 MG PO TBEC: 81.0000 mg | DELAYED_RELEASE_TABLET | Freq: Every day | ORAL | 1 refills | Status: DC

## 2019-07-22 MED ORDER — METFORMIN HCL 1000 MG PO TABS: 1000.0000 mg | ORAL_TABLET | Freq: Two times a day (BID) | ORAL | 1 refills | Status: DC

## 2019-07-23 NOTE — Progress Notes (Signed)
Patient ID: [name], male   DOB: 1961/09/24, 58 y.o.   MRN: [id number] This is a 58 year old male [facility] recently released from prison history of hypertension diabetes and also bipolar disorder.  Patient note has had a history of allergies to lisinopril with facial edema.  Patient was on insulin while in prison but discharged on metformin at thousand milligrams twice daily.  Patient also is on Zyprexa 15 mg at bedtime atorvastatin 20 mg daily aspirin 81 mg daily and hydrochlorthiazide 25 mg daily.  The patient is wishing to establish primary care.  On exam blood pressure 150/92 pulse 99 saturation 97% temperature 98 blood sugar was 417  Chest and cardiac exam are unremarkable extremities showed significant toenail fungus and poor foot hygiene  Impression is that of diabetes poorly controlled and hypertension poorly controlled  Plan will be to get the patient connected with [facility] for mental health services and to also given [facility] in the interim we will bridge him with refills on his metformin thousand milligrams twice daily Zyprexa 15 mg at bedtime atorvastatin 20 mg daily aspirin 81 mg daily hydrochlorthiazide 25 mg daily we will add amlodipine 10 mg daily and begin Lantus 30 units at bedtime and we will instruct him as to the proper use of the Lantus

## 2019-07-29 ENCOUNTER — Encounter: Payer: Self-pay | Admitting: Critical Care Medicine

## 2019-07-29 ENCOUNTER — Other Ambulatory Visit: Payer: Self-pay | Admitting: Critical Care Medicine

## 2019-07-29 MED ORDER — ALBUTEROL SULFATE HFA 108 (90 BASE) MCG/ACT IN AERS: 2.0000 | INHALATION_SPRAY | RESPIRATORY_TRACT | 0 refills | Status: DC | PRN

## 2019-07-29 MED ORDER — ACCU-CHEK GUIDE W/DEVICE KIT: 1.0000 [IU] | PACK | Freq: Two times a day (BID) | 0 refills | Status: DC

## 2019-07-29 MED ORDER — ACCU-CHEK GUIDE VI STRP: ORAL_STRIP | 12 refills | Status: DC

## 2019-07-29 MED ORDER — ACCU-CHEK SOFTCLIX LANCETS MISC: 12 refills | Status: DC

## 2019-07-29 MED ORDER — ACCU-CHEK SOFTCLIX LANCET DEV KIT: PACK | 0 refills | Status: DC

## 2019-07-29 NOTE — Progress Notes (Signed)
Albuterol /DM testing supplies Rx

## 2019-07-30 NOTE — Progress Notes (Signed)
Patient ID: [name], male   DOB: 1961-03-06, 58 y.o.   MRN: [id number]  This is a 58 year old male seen in follow-up in the [facility] with hyperglycemia diabetes type 2 and also moderate persistent asthma and ongoing toenail foot pain.  Patient also has schizophrenia and is has an established appointment tomorrow at [facility] at 9 AM.  On exam blood pressure is 184/76 pulse 76 blood sugar was 390 this morning  [name] exam is unremarkable and unchanged from the last visit  Impression is type 2 diabetes poorly controlled patient is now starting the Lantus at a dose of 30 units daily patient will also continue metformin at thousand milligrams twice daily  We will obtain for the patient albuterol inhaler as needed and also diabetic testing kit and supplies  We will get this patient into the clinic for further follow-up and at that point endeavor to arrange for podiatry referral  We will continue current blood pressure medication program for now which is including the hydrochlorthiazide and amlodipine

## 2019-07-31 ENCOUNTER — Other Ambulatory Visit: Payer: Self-pay

## 2019-07-31 DIAGNOSIS — Z20822 Contact with and (suspected) exposure to covid-19: Secondary | ICD-10-CM

## 2019-08-01 LAB — NOVEL CORONAVIRUS, NAA: SARS-CoV-2, NAA: NOT DETECTED

## 2019-08-12 ENCOUNTER — Telehealth: Payer: Self-pay | Admitting: General Practice

## 2019-08-12 NOTE — Telephone Encounter (Signed)
I called Pt case worker from [facility], LVM to call me back about the request for me to see them on my office

## 2019-08-16 NOTE — Progress Notes (Signed)
Subjective:    Patient ID: [name], male    DOB: [birth year], 58 y.o.   MRN: [id number]  This is a 58 year old male seen recently in the [facility] and was released from prison recently has history of hypertension type 2 diabetes and bipolar disorder.  The patient has had now given access to Lantus and was taking 35 units at bedtime patient also maintains atorvastatin, hydrochlorthiazide,Metformin, and amlodipine.  The patient notes occasional burning in the feet.  He had been on gabapentin previously but is not on gabapentin at this time.  I did resume Zyprexa on this patient 15 mg at bedtime and he has an upcoming appointment with mental health at [facility].  Glucoses have been in the 170 to as high as 300 after meals.  Denies any chest pain or dyspnea.   Past Medical History:  Diagnosis Date  . Diabetes mellitus   . Diabetic neuropathy ([place])   . High cholesterol   . Hypertension   . Schizophrenia ([place])      Family History  Problem Relation Age of Onset  . Hypertension Mother   . Cancer Father   . Alcoholism Other      Social History   Socioeconomic History  . Marital status: Single    Spouse name: Not on file  . Number of children: Not on file  . Years of education: Not on file  . Highest education level: Not on file  Occupational History  . Not on file  Social Needs  . Financial resource strain: Not on file  . Food insecurity    Worry: Not on file    Inability: Not on file  . Transportation needs    Medical: Not on file    Non-medical: Not on file  Tobacco Use  . Smoking status: Current Every Day Smoker    Packs/day: 0.50    Types: Cigarettes  . Smokeless tobacco: Never Used  Substance and Sexual Activity  . Alcohol use: No  . Drug use: No  . Sexual activity: Yes  Lifestyle  . Physical activity    Days per week: Not on file    Minutes per session: Not on file  . Stress: Not on file  Relationships  . Social Herbalist  on phone: Not on file    Gets together: Not on file    Attends religious service: Not on file    Active member of club or organization: Not on file    Attends meetings of clubs or organizations: Not on file    Relationship status: Not on file  . Intimate partner violence    Fear of current or ex partner: Not on file    Emotionally abused: Not on file    Physically abused: Not on file    Forced sexual activity: Not on file  Other Topics Concern  . Not on file  Social History Narrative  . Not on file     Allergies  Allergen Reactions  . Benadryl [Diphenhydramine Hcl]   . Diphenhydramine     GI upset, paradoxic agitation  . Haldol [Haloperidol Lactate]   . Haldol [Haloperidol]     "makes me tremble, bite my tongue"  . Lisinopril Swelling  . Thorazine [Chlorpromazine]     Tremble, bite my tongue  . Thorazine [Chlorpromazine]      Outpatient Medications Prior to Visit  Medication Sig Dispense Refill  . Accu-Chek Softclix Lancets lancets Use as instructed 100 each 12  .  albuterol (VENTOLIN HFA) 108 (90 Base) MCG/ACT inhaler Inhale 2 puffs into the lungs every 4 (four) hours as needed for wheezing or shortness of breath. 18 g 0  . aspirin EC 81 MG tablet Take 1 tablet (81 mg total) by mouth daily. 30 tablet 1  . benzonatate (TESSALON PERLES) 100 MG capsule Take 1 capsule (100 mg total) by mouth 3 (three) times daily as needed for cough. 20 capsule 0  . Blood Glucose Monitoring Suppl (ACCU-CHEK GUIDE) w/Device KIT 1 Units by Does not apply route 2 (two) times daily. 1 kit 0  . glucose blood (ACCU-CHEK GUIDE) test strip Use as instructed 100 each 12  . Lancets Misc. (ACCU-CHEK SOFTCLIX LANCET DEV) KIT Check blood sugar twice a day 1 kit 0  . OLANZapine (ZYPREXA) 15 MG tablet Take 1 tablet (15 mg total) by mouth at bedtime. 30 tablet 1  . amLODipine (NORVASC) 10 MG tablet Take 1 tablet (10 mg total) by mouth daily. 90 tablet 3  . atorvastatin (LIPITOR) 20 MG tablet Take 1 tablet (20  mg total) by mouth daily. Reported on 05/01/2016 90 tablet 3  . divalproex (DEPAKOTE) 500 MG DR tablet Take 1 tablet (500 mg total) by mouth 2 (two) times daily. 60 tablet 3  . gabapentin (NEURONTIN) 300 MG capsule Take 2 capsules (600 mg total) by mouth 3 (three) times daily. 90 capsule 3  . hydrochlorothiazide (HYDRODIURIL) 25 MG tablet Take 1 tablet (25 mg total) by mouth daily. 30 tablet 3  . Insulin Glargine (LANTUS SOLOSTAR) 100 UNIT/ML Solostar Pen Inject 30 Units into the skin at bedtime. 5 pen 11  . Insulin Pen Needle 29G X 5MM MISC Use with Lantus pen 100 each 0  . linagliptin (TRADJENTA) 5 MG TABS tablet Take 1 tablet (5 mg total) by mouth daily. 30 tablet 3  . meloxicam (MOBIC) 15 MG tablet Take 1 tablet (15 mg total) by mouth daily. 30 tablet 0  . metFORMIN (GLUCOPHAGE) 1000 MG tablet Take 1 tablet (1,000 mg total) by mouth 2 (two) times daily with a meal. 60 tablet 1  . pantoprazole (PROTONIX) 40 MG tablet Take 1 tablet (40 mg total) by mouth daily. 30 tablet 3  . trihexyphenidyl (ARTANE) 2 MG tablet Take 1 tablet (2 mg total) by mouth at bedtime. 30 tablet 0   No facility-administered medications prior to visit.    Review of Systems  Pos In BOLD  Constitutional:   No  weight loss, night sweats,  Fevers, chills, fatigue, lassitude. HEENT:   No headaches,  Difficulty swallowing,  Tooth/dental problems,  Sore throat,                No sneezing, itching, ear ache, nasal congestion, post nasal drip,   CV:  No chest pain,  Orthopnea, PND, swelling in lower extremities, anasarca, dizziness, palpitations  GI  No heartburn, indigestion, abdominal pain, nausea, vomiting, diarrhea, change in bowel habits, loss of appetite  Resp: No shortness of breath with exertion or at rest.  No excess mucus, no productive cough,  No non-productive cough,  No coughing up of blood.  No change in color of mucus.  No wheezing.  No chest wall deformity  Skin: no rash or lesions.  GU: no dysuria, change  in color of urine, no urgency or frequency.  No flank pain.  MS:  No joint pain or swelling.  No decreased range of motion.  No back pain.  FOOT PAIN bilateral   Psych:  No change in  mood or affect. No depression or anxiety.  No memory loss.     Objective:   Physical Exam Vitals:   08/17/19 0937  BP: 131/89  Pulse: 84  Temp: 98.1 F (36.7 C)  TempSrc: Oral  SpO2: 98%  Weight: 196 lb (88.9 kg)  Height: 6' 1"  (1.854 m)    Gen: Pleasant, well-nourished, in no distress,  normal affect  ENT: No lesions,  mouth clear,  oropharynx clear, no postnasal drip  Neck: No JVD, no TMG, no carotid bruits  Lungs: No use of accessory muscles, no dullness to percussion, clear without rales or rhonchi  Cardiovascular: RRR, heart sounds normal, no murmur or gallops, no peripheral edema  Abdomen: soft and NT, no HSM,  BS normal  Musculoskeletal: No deformities, no cyanosis or clubbing  Neuro: alert, non focal  Skin: Warm, no lesions or rashes FOOT EXAM  Normal pulses, no ulcerations, decreased sensation both feet.  Severe toenail fungus  All labs reviewed in [facility]    Assessment & Plan:  I personally reviewed all images and lab data in the [facility] system as well as any outside material available during this office visit and agree with the  radiology impressions.   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  Diagnoses and all orders for this visit:  Type 2 diabetes mellitus with hyperosmolarity without coma, with long-term current use of insulin ([place]) -     Hemoglobin A1c -     Lipid panel -     Comprehensive metabolic panel -     CBC with Differential/Platelet; Future -     Microalbumin, urine -     CBC with Differential/Platelet  Essential hypertension -     Thyroid Profile -     Comprehensive metabolic panel -     CBC with Differential/Platelet; Future -     CBC with Differential/Platelet  Chronic paranoid schizophrenia (HCC)  Colon cancer screening -     Fecal occult blood, imunochemical  DM (diabetes mellitus), type 2 with neurological complications (HCC)  Toenail fungus  Other orders -     amLODipine (NORVASC) 10 MG tablet; Take 1 tablet (10 mg total) by mouth daily. -     atorvastatin (LIPITOR) 20 MG tablet; Take 1 tablet (20 mg total) by mouth daily. Reported on 05/01/2016 -     hydrochlorothiazide (HYDRODIURIL) 25 MG tablet; Take 1 tablet (25 mg total) by mouth daily. -     Insulin Glargine (LANTUS SOLOSTAR) 100 UNIT/ML Solostar Pen; Inject 35 Units into the skin at bedtime. -     Insulin Pen Needle 29G X 5MM MISC; Use with Lantus pen -     metFORMIN (GLUCOPHAGE) 1000 MG tablet; Take 1 tablet (1,000 mg total) by mouth 2 (two) times daily with a meal.  Will give Tdap   Pt refused Flu vaccine.   Fecal Blood sent.

## 2019-08-17 ENCOUNTER — Other Ambulatory Visit: Payer: Self-pay

## 2019-08-17 ENCOUNTER — Ambulatory Visit: Payer: Self-pay | Attending: Critical Care Medicine | Admitting: Critical Care Medicine

## 2019-08-17 ENCOUNTER — Telehealth: Payer: Self-pay | Admitting: *Deleted

## 2019-08-17 ENCOUNTER — Encounter: Payer: Self-pay | Admitting: Critical Care Medicine

## 2019-08-17 VITALS — BP 131/89 | HR 84 | Temp 98.1°F | Ht 73.0 in | Wt 196.0 lb

## 2019-08-17 DIAGNOSIS — F200 Paranoid schizophrenia: Secondary | ICD-10-CM

## 2019-08-17 DIAGNOSIS — Z23 Encounter for immunization: Secondary | ICD-10-CM

## 2019-08-17 DIAGNOSIS — Z794 Long term (current) use of insulin: Secondary | ICD-10-CM

## 2019-08-17 DIAGNOSIS — E1100 Type 2 diabetes mellitus with hyperosmolarity without nonketotic hyperglycemic-hyperosmolar coma (NKHHC): Secondary | ICD-10-CM

## 2019-08-17 DIAGNOSIS — B351 Tinea unguium: Secondary | ICD-10-CM | POA: Insufficient documentation

## 2019-08-17 DIAGNOSIS — Z1211 Encounter for screening for malignant neoplasm of colon: Secondary | ICD-10-CM

## 2019-08-17 DIAGNOSIS — I10 Essential (primary) hypertension: Secondary | ICD-10-CM

## 2019-08-17 DIAGNOSIS — E1149 Type 2 diabetes mellitus with other diabetic neurological complication: Secondary | ICD-10-CM

## 2019-08-17 HISTORY — DX: Tinea unguium: B35.1

## 2019-08-17 MED ORDER — HYDROCHLOROTHIAZIDE 25 MG PO TABS: 25.0000 mg | ORAL_TABLET | Freq: Every day | ORAL | 3 refills | Status: DC

## 2019-08-17 MED ORDER — METFORMIN HCL 1000 MG PO TABS: 1000.0000 mg | ORAL_TABLET | Freq: Two times a day (BID) | ORAL | 1 refills | Status: DC

## 2019-08-17 MED ORDER — INSULIN PEN NEEDLE 29G X 5MM MISC: 0 refills | Status: DC

## 2019-08-17 MED ORDER — AMLODIPINE BESYLATE 10 MG PO TABS: 10.0000 mg | ORAL_TABLET | Freq: Every day | ORAL | 3 refills | Status: DC

## 2019-08-17 MED ORDER — LANTUS SOLOSTAR 100 UNIT/ML ~~LOC~~ SOPN: 35.0000 [IU] | PEN_INJECTOR | Freq: Every day | SUBCUTANEOUS | 11 refills | Status: DC

## 2019-08-17 MED ORDER — ATORVASTATIN CALCIUM 20 MG PO TABS: 20.0000 mg | ORAL_TABLET | Freq: Every day | ORAL | 3 refills | Status: DC

## 2019-08-17 MED FILL — !LANTUS SOLOSTAR 100UNITS/M: 100 | 24 days supply | Qty: 9 | Fill #0

## 2019-08-17 MED FILL — HYDROCHLOROTHIAZIDE 25 MG T: 25 | 30 days supply | Qty: 30 | Fill #0

## 2019-08-17 MED FILL — ATORVASTATIN 20 MG TABLET: 20 | 30 days supply | Qty: 30 | Fill #0

## 2019-08-17 MED FILL — TRUEPLUS PEN NDL 31GX3/16: 31G X 5 MM | 30 days supply | Qty: 100 | Fill #0

## 2019-08-17 MED FILL — metFORMIN HCL 1000 MG TABS: 1000 | 30 days supply | Qty: 60 | Fill #0

## 2019-08-17 MED FILL — AMLODIPINE BESYLATE 10 MG T: 10 | 90 days supply | Qty: 90 | Fill #0

## 2019-08-17 NOTE — Assessment & Plan Note (Signed)
History of essential hypertension with improved control currently now on amlodipine and hydrochlorthiazide  We will check thyroid panel and metabolic panel

## 2019-08-17 NOTE — Addendum Note (Signed)
Addended by: Emilio Aspen A on: 08/17/2019 02:51 PM   Modules accepted: Orders

## 2019-08-17 NOTE — Telephone Encounter (Signed)
Staff called [name] nurse/case manager for patient and LMOM [phone] so patient can get organized to go to the Podiatry office.  If the staff from [facility] calls, The referral was made with [facility] and their number is [phone] and they need to schedule an appt for patient.

## 2019-08-17 NOTE — Assessment & Plan Note (Signed)
Significant bilateral toenail fungus  Will refer to podiatry

## 2019-08-17 NOTE — Assessment & Plan Note (Signed)
Significant chronic paranoid schizophrenia controlled on Zyprexa  Plan will be to refer patient to [facility] for further mental health care

## 2019-08-17 NOTE — Assessment & Plan Note (Signed)
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

## 2019-08-17 NOTE — Patient Instructions (Signed)
Increase Lantus to 35 units daily  No other medication changes  Refills on all your medicines were sent to our local pharmacy  Please keep your appointment with [facility] and they will manage her mental health medications  Please keep your appointment with the foot doctor  Please complete your financial paperwork and an appointment will be established for financial assistance  Return to see Dr. Joya Gaskins 6 weeks

## 2019-08-18 LAB — LIPID PANEL
Chol/HDL Ratio: 2.3 ratio (ref 0.0–5.0)
Cholesterol, Total: 124 mg/dL (ref 100–199)
HDL: 54 mg/dL (ref >39)
LDL Calculated: 57 mg/dL (ref 0–99)
Triglycerides: 64 mg/dL (ref 0–149)
VLDL Cholesterol Cal: 13 mg/dL (ref 5–40)

## 2019-08-18 LAB — CBC WITH DIFFERENTIAL/PLATELET
Basophils Absolute: 0.1 10*3/uL (ref 0.0–0.2)
Basos: 1 %
EOS (ABSOLUTE): 0.7 10*3/uL — ABNORMAL HIGH (ref 0.0–0.4)
Eos: 7 %
Hematocrit: 45.7 % (ref 37.5–51.0)
Hemoglobin: 15 g/dL (ref 13.0–17.7)
Immature Grans (Abs): 0 10*3/uL (ref 0.0–0.1)
Immature Granulocytes: 0 %
Lymphocytes Absolute: 2.1 10*3/uL (ref 0.7–3.1)
Lymphs: 23 %
MCH: 27.8 pg (ref 26.6–33.0)
MCHC: 32.8 g/dL (ref 31.5–35.7)
MCV: 85 fL (ref 79–97)
Monocytes Absolute: 0.7 10*3/uL (ref 0.1–0.9)
Monocytes: 7 %
Neutrophils Absolute: 5.7 10*3/uL (ref 1.4–7.0)
Neutrophils: 62 %
Platelets: 160 10*3/uL (ref 150–450)
RBC: 5.39 x10E6/uL (ref 4.14–5.80)
RDW: 13.4 % (ref 11.6–15.4)
WBC: 9.3 10*3/uL (ref 3.4–10.8)

## 2019-08-18 LAB — COMPREHENSIVE METABOLIC PANEL
ALT: 18 IU/L (ref 0–44)
AST: 22 IU/L (ref 0–40)
Albumin/Globulin Ratio: 1.5 (ref 1.2–2.2)
Albumin: 4.7 g/dL (ref 3.8–4.9)
Alkaline Phosphatase: 83 IU/L (ref 39–117)
BUN/Creatinine Ratio: 19 (ref 9–20)
BUN: 19 mg/dL (ref 6–24)
Bilirubin Total: 0.3 mg/dL (ref 0.0–1.2)
CO2: 24 mmol/L (ref 20–29)
Calcium: 10 mg/dL (ref 8.7–10.2)
Chloride: 99 mmol/L (ref 96–106)
Creatinine, Ser: 1 mg/dL (ref 0.76–1.27)
GFR calc Af Amer: 95 mL/min/{1.73_m2} (ref >59)
GFR calc non Af Amer: 83 mL/min/{1.73_m2} (ref >59)
Globulin, Total: 3.2 g/dL (ref 1.5–4.5)
Glucose: 158 mg/dL — ABNORMAL HIGH (ref 65–99)
Potassium: 4.1 mmol/L (ref 3.5–5.2)
Sodium: 139 mmol/L (ref 134–144)
Total Protein: 7.9 g/dL (ref 6.0–8.5)

## 2019-08-18 LAB — THYROID PANEL
Free Thyroxine Index: 2.4 (ref 1.2–4.9)
T3 Uptake Ratio: 26 % (ref 24–39)
T4, Total: 9.2 ug/dL (ref 4.5–12.0)

## 2019-08-18 LAB — HEMOGLOBIN A1C
Est. average glucose Bld gHb Est-mCnc: 249 mg/dL
Hgb A1c MFr Bld: 10.3 % — ABNORMAL HIGH (ref 4.8–5.6)

## 2019-08-18 LAB — MICROALBUMIN, URINE: Microalbumin, Urine: 103.8 ug/mL

## 2019-08-19 ENCOUNTER — Other Ambulatory Visit: Payer: Self-pay | Admitting: Critical Care Medicine

## 2019-08-19 ENCOUNTER — Encounter: Payer: Self-pay | Admitting: Critical Care Medicine

## 2019-08-19 MED ORDER — GABAPENTIN 300 MG PO CAPS: 300.0000 mg | ORAL_CAPSULE | Freq: Three times a day (TID) | ORAL | 3 refills | Status: DC

## 2019-08-19 MED ORDER — ACCU-CHEK SOFT TOUCH LANCETS MISC: 12 refills | Status: DC

## 2019-08-19 MED FILL — GABAPENTIN 300 MG CAPSULE: 300 | 30 days supply | Qty: 90 | Fill #0

## 2019-08-19 MED FILL — OLANZapine 15 MG TABS: 15 | 30 days supply | Qty: 30 | Fill #0

## 2019-08-20 NOTE — Progress Notes (Signed)
Patient ID: [name], male   DOB: 02-24-1961, 58 y.o.   MRN: [id number]

## 2019-08-20 NOTE — Progress Notes (Signed)
Patient ID: [name], male   DOB: [birth year], 58 y.o.   MRN: [id number] This patient was seen in the [facility] today and his request was he needs his medication list faxed to his psychiatrist at [facility]  I also reviewed with the patient his recent laboratory results  I will have the medication list faxed today to the [facility] clinic

## 2019-08-20 NOTE — Progress Notes (Signed)
Patient ID: [name], male   DOB: [birth year], 58 y.o.   MRN: [id number]  Past Medical History:  Diagnosis Date  . Diabetes mellitus   . Diabetic neuropathy ([place])   . High cholesterol   . Hypertension   . Schizophrenia ([place])      Family History  Problem Relation Age of Onset  . Hypertension Mother   . Cancer Father   . Alcoholism Other      Social History   Socioeconomic History  . Marital status: Single    Spouse name: Not on file  . Number of children: Not on file  . Years of education: Not on file  . Highest education level: Not on file  Occupational History  . Not on file  Social Needs  . Financial resource strain: Not on file  . Food insecurity    Worry: Not on file    Inability: Not on file  . Transportation needs    Medical: Not on file    Non-medical: Not on file  Tobacco Use  . Smoking status: Current Every Day Smoker    Packs/day: 0.50    Types: Cigarettes  . Smokeless tobacco: Never Used  Substance and Sexual Activity  . Alcohol use: No  . Drug use: No  . Sexual activity: Yes  Lifestyle  . Physical activity    Days per week: Not on file    Minutes per session: Not on file  . Stress: Not on file  Relationships  . Social Herbalist on phone: Not on file    Gets together: Not on file    Attends religious service: Not on file    Active member of club or organization: Not on file    Attends meetings of clubs or organizations: Not on file    Relationship status: Not on file  . Intimate partner violence    Fear of current or ex partner: Not on file    Emotionally abused: Not on file    Physically abused: Not on file    Forced sexual activity: Not on file  Other Topics Concern  . Not on file  Social History Narrative  . Not on file     Allergies  Allergen Reactions  . Benadryl [Diphenhydramine Hcl]   . Diphenhydramine     GI upset, paradoxic agitation  . Haldol [Haloperidol Lactate]   . Haldol [Haloperidol]     "makes me  tremble, bite my tongue"  . Lisinopril Swelling  . Thorazine [Chlorpromazine]     Tremble, bite my tongue  . Thorazine [Chlorpromazine]      Outpatient Medications Prior to Visit  Medication Sig Dispense Refill  . albuterol (VENTOLIN HFA) 108 (90 Base) MCG/ACT inhaler Inhale 2 puffs into the lungs every 4 (four) hours as needed for wheezing or shortness of breath. 18 g 0  . amLODipine (NORVASC) 10 MG tablet Take 1 tablet (10 mg total) by mouth daily. 90 tablet 3  . aspirin EC 81 MG tablet Take 1 tablet (81 mg total) by mouth daily. 30 tablet 1  . atorvastatin (LIPITOR) 20 MG tablet Take 1 tablet (20 mg total) by mouth daily. Reported on 05/01/2016 90 tablet 3  . benzonatate (TESSALON PERLES) 100 MG capsule Take 1 capsule (100 mg total) by mouth 3 (three) times daily as needed for cough. 20 capsule 0  . Blood Glucose Monitoring Suppl (ACCU-CHEK GUIDE) w/Device KIT 1 Units by Does not apply route 2 (two) times daily. 1 kit 0  .  gabapentin (NEURONTIN) 300 MG capsule Take 1 capsule (300 mg total) by mouth 3 (three) times daily. 90 capsule 3  . glucose blood (ACCU-CHEK GUIDE) test strip Use as instructed 100 each 12  . hydrochlorothiazide (HYDRODIURIL) 25 MG tablet Take 1 tablet (25 mg total) by mouth daily. 30 tablet 3  . Insulin Glargine (LANTUS SOLOSTAR) 100 UNIT/ML Solostar Pen Inject 35 Units into the skin at bedtime. 5 pen 11  . Insulin Pen Needle 29G X 5MM MISC Use with Lantus pen 100 each 0  . Lancets (ACCU-CHEK SOFT TOUCH) lancets Use as instructed 100 each 12  . Lancets Misc. (ACCU-CHEK SOFTCLIX LANCET DEV) KIT Check blood sugar twice a day 1 kit 0  . metFORMIN (GLUCOPHAGE) 1000 MG tablet Take 1 tablet (1,000 mg total) by mouth 2 (two) times daily with a meal. 60 tablet 1  . OLANZapine (ZYPREXA) 15 MG tablet Take 1 tablet (15 mg total) by mouth at bedtime. 30 tablet 1   No facility-administered medications prior to visit.

## 2019-08-21 ENCOUNTER — Ambulatory Visit (INDEPENDENT_AMBULATORY_CARE_PROVIDER_SITE_OTHER): Payer: Medicaid Other | Admitting: Podiatry

## 2019-08-21 ENCOUNTER — Encounter: Payer: Self-pay | Admitting: Podiatry

## 2019-08-21 ENCOUNTER — Other Ambulatory Visit: Payer: Self-pay

## 2019-08-21 VITALS — Temp 98.3°F

## 2019-08-21 DIAGNOSIS — B351 Tinea unguium: Secondary | ICD-10-CM | POA: Diagnosis not present

## 2019-08-21 DIAGNOSIS — M79675 Pain in left toe(s): Secondary | ICD-10-CM

## 2019-08-21 DIAGNOSIS — M79674 Pain in right toe(s): Secondary | ICD-10-CM

## 2019-08-21 DIAGNOSIS — E0800 Diabetes mellitus due to underlying condition with hyperosmolarity without nonketotic hyperglycemic-hyperosmolar coma (NKHHC): Secondary | ICD-10-CM

## 2019-08-21 NOTE — Progress Notes (Signed)
Complaint:  Visit Type: Patient returns to my office for continued preventative foot care services. Complaint: Patient states" my nails have grown long and thick and become painful to walk and wear shoes" Patient has been diagnosed with DM with no foot complications. The patient presents for preventative foot care services. No changes to ROS  Podiatric Exam: Vascular: dorsalis pedis and posterior tibial pulses are palpable bilateral. Capillary return is immediate. Temperature gradient is WNL. Skin turgor WNL  Sensorium: Normal/Diminished  Semmes Weinstein monofilament test. Normal tactile sensation bilaterally. Nail Exam: Pt has thick disfigured discolored nails with subungual debris noted bilateral entire nail hallux through fifth toenails Ulcer Exam: There is no evidence of ulcer or pre-ulcerative changes or infection. Orthopedic Exam: Muscle tone and strength are WNL. No limitations in general ROM. No crepitus or effusions noted. Foot type and digits show no abnormalities. Bony prominences are unremarkable. Skin: No Porokeratosis. No infection or ulcers.  Masceration 4,5 right and 4 left interspaces.  Diagnosis:  Onychomycosis, , Pain in right toe, pain in left toes  Treatment & Plan Procedures and Treatment: Consent by patient was obtained for treatment procedures.   Debridement of mycotic and hypertrophic toenails, 1 through 5 bilateral and clearing of subungual debris. No ulceration, no infection noted. GV applied to interspaces. Return Visit-Office Procedure: Patient instructed to return to the office for a follow up visit 3 months for continued evaluation and treatment.    Gardiner Barefoot DPM

## 2019-09-02 ENCOUNTER — Ambulatory Visit: Payer: Self-pay | Attending: Family Medicine

## 2019-09-02 ENCOUNTER — Other Ambulatory Visit: Payer: Self-pay

## 2019-09-02 ENCOUNTER — Telehealth: Payer: Self-pay | Admitting: Critical Care Medicine

## 2019-09-02 NOTE — Telephone Encounter (Signed)
Unable to reach the Pt case worker, Unable to LVM, Pt need to come in an sign the application for CAFA and OC, also need a clear copy of the Release documents and the letter from [employer]

## 2019-09-02 NOTE — Telephone Encounter (Signed)
I called the Pt nurse/case worker about the Pt, LVM to call me back since Pt was missing some paper to complete the appliaction

## 2019-09-03 ENCOUNTER — Other Ambulatory Visit: Payer: Self-pay | Admitting: Pharmacist

## 2019-09-03 ENCOUNTER — Other Ambulatory Visit: Payer: Self-pay | Admitting: Critical Care Medicine

## 2019-09-03 DIAGNOSIS — E1100 Type 2 diabetes mellitus with hyperosmolarity without nonketotic hyperglycemic-hyperosmolar coma (NKHHC): Secondary | ICD-10-CM

## 2019-09-03 MED ORDER — TRUE METRIX METER W/DEVICE KIT: PACK | 0 refills | Status: DC

## 2019-09-03 MED ORDER — ACCU-CHEK SOFT TOUCH LANCETS MISC: 12 refills | Status: DC

## 2019-09-03 MED ORDER — TRUE METRIX BLOOD GLUCOSE TEST VI STRP: ORAL_STRIP | 12 refills | Status: DC

## 2019-09-03 MED ORDER — TRUEPLUS LANCETS 28G MISC: 11 refills | Status: DC

## 2019-09-03 NOTE — Progress Notes (Signed)
Refills on lancets.

## 2019-09-06 NOTE — Progress Notes (Deleted)
Subjective:    Patient ID: [name], male    DOB: [birth year], 58 y.o.   MRN: [id number]  This is a 58 year old male seen recently in the [facility] and was released from prison recently has history of hypertension type 2 diabetes and bipolar disorder.  The patient has had now given access to Lantus and was taking 35 units at bedtime patient also maintains atorvastatin, hydrochlorthiazide,Metformin, and amlodipine.  The patient notes occasional burning in the feet.  He had been on gabapentin previously but is not on gabapentin at this time.  I did resume Zyprexa on this patient 15 mg at bedtime and he has an upcoming appointment with mental health at [facility].  Glucoses have been in the 170 to as high as 300 after meals.  Denies any chest pain or dyspnea.   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 Diabetes mellitus   . Diabetic neuropathy ([place])   . High cholesterol   . Hypertension   . Schizophrenia ([place])      Family History  Problem Relation Age of Onset  . Hypertension Mother   . Cancer Father   . Alcoholism Other      Social History   Socioeconomic History  . Marital status: Single    Spouse name: Not on file  . Number of children: Not on file  . Years of education: Not on file  . Highest education level: Not on file  Occupational History  . Not on file  Social Needs  . Financial resource strain: Not on file   . Food insecurity    Worry: Not on file    Inability: Not on file  . Transportation needs    Medical: Not on file    Non-medical: Not on file  Tobacco Use  . Smoking status: Current Every Day Smoker    Packs/day: 0.50    Types: Cigarettes  . Smokeless tobacco: Never Used  Substance and Sexual Activity  . Alcohol use: No  . Drug use: No  . Sexual activity: Yes  Lifestyle  . Physical activity    Days per week: Not on file    Minutes per session: Not on file  . Stress: Not on file  Relationships  . Social Herbalist on phone: Not on file    Gets together: Not on file    Attends religious service: Not on file    Active member of club or organization: Not on file    Attends meetings of clubs or organizations: Not on file    Relationship status: Not on file  . Intimate partner violence    Fear of current or ex partner: Not on file    Emotionally abused: Not on file    Physically abused: Not on file    Forced sexual activity: Not on file  Other Topics Concern  . Not on file  Social History Narrative  . Not on file     Allergies  Allergen  Reactions  . Benadryl [Diphenhydramine Hcl]   . Diphenhydramine     GI upset, paradoxic agitation  . Haldol [Haloperidol Lactate]   . Haldol [Haloperidol]     "makes me tremble, bite my tongue"  . Lisinopril Swelling  . Thorazine [Chlorpromazine]     Tremble, bite my tongue  . Thorazine [Chlorpromazine]      Outpatient Medications Prior to Visit  Medication Sig Dispense Refill  . albuterol (VENTOLIN HFA) 108 (90 Base) MCG/ACT inhaler Inhale 2 puffs into the lungs every 4 (four) hours as needed for wheezing or shortness of breath. 18 g 0  . amLODipine (NORVASC) 10 MG tablet Take 1 tablet (10 mg total) by mouth daily. 90 tablet 3  . aspirin EC 81 MG tablet Take 1 tablet (81 mg total) by mouth daily. 30 tablet 1  . atorvastatin (LIPITOR) 20 MG tablet Take 1 tablet (20 mg total) by mouth daily. Reported on 05/01/2016 90 tablet  3  . benzonatate (TESSALON PERLES) 100 MG capsule Take 1 capsule (100 mg total) by mouth 3 (three) times daily as needed for cough. 20 capsule 0  . Blood Glucose Monitoring Suppl (TRUE METRIX METER) w/Device KIT Use as instructed to check blood sugar daily. 1 kit 0  . gabapentin (NEURONTIN) 300 MG capsule Take 1 capsule (300 mg total) by mouth 3 (three) times daily. 90 capsule 3  . glucose blood (TRUE METRIX BLOOD GLUCOSE TEST) test strip Use as instructed to check blood sugar daily. 100 each 12  . hydrochlorothiazide (HYDRODIURIL) 25 MG tablet Take 1 tablet (25 mg total) by mouth daily. 30 tablet 3  . Insulin Glargine (LANTUS SOLOSTAR) 100 UNIT/ML Solostar Pen Inject 35 Units into the skin at bedtime. 5 pen 11  . Insulin Pen Needle 29G X 5MM MISC Use with Lantus pen 100 each 0  . metFORMIN (GLUCOPHAGE) 1000 MG tablet Take 1 tablet (1,000 mg total) by mouth 2 (two) times daily with a meal. 60 tablet 1  . OLANZapine (ZYPREXA) 15 MG tablet Take 1 tablet (15 mg total) by mouth at bedtime. 30 tablet 1  . TRUEplus Lancets 28G MISC Use as instructed to check blood sugar daily. 100 each 11   No facility-administered medications prior to visit.    Review of Systems   Pos In BOLD  Constitutional:   No  weight loss, night sweats,  Fevers, chills, fatigue, lassitude. HEENT:   No headaches,  Difficulty swallowing,  Tooth/dental problems,  Sore throat,                No sneezing, itching, ear ache, nasal congestion, post nasal drip,   CV:  No chest pain,  Orthopnea, PND, swelling in lower extremities, anasarca, dizziness, palpitations  GI  No heartburn, indigestion, abdominal pain, nausea, vomiting, diarrhea, change in bowel habits, loss of appetite  Resp: No shortness of breath with exertion or at rest.  No excess mucus, no productive cough,  No non-productive cough,  No coughing up of blood.  No change in color of mucus.  No wheezing.  No chest wall deformity  Skin: no rash or lesions.  GU: no  dysuria, change in color of urine, no urgency or frequency.  No flank pain.  MS:  No joint pain or swelling.  No decreased range of motion.  No back pain.  FOOT PAIN bilateral   Psych:  No change in mood or affect. No depression or anxiety.  No memory loss.     Objective:   Physical Exam    There were no vitals filed for this visit.  Gen: Pleasant, well-nourished, in no distress,  normal affect  ENT: No lesions,  mouth clear,  oropharynx clear, no postnasal drip  Neck: No JVD, no TMG, no carotid bruits  Lungs: No use of accessory muscles, no dullness to percussion, clear without rales or rhonchi  Cardiovascular: RRR, heart sounds normal, no murmur or gallops, no peripheral edema  Abdomen: soft and NT, no HSM,  BS normal  Musculoskeletal: No deformities, no cyanosis or clubbing  Neuro: alert, non focal  Skin: Warm, no lesions or rashes FOOT EXAM  Normal pulses, no ulcerations, decreased sensation both feet.  Severe toenail fungus  All labs reviewed in [facility]    Assessment & Plan:  I personally reviewed all images and lab data in the [facility] system as well as any outside material available during this office visit and agree with the  radiology impressions.   No problem-specific Assessment & Plan notes found for this encounter.   There are no diagnoses linked to this encounter.Will give Tdap   Pt refused Flu vaccine.   Fecal Blood sent.   

## 2019-09-07 ENCOUNTER — Ambulatory Visit: Payer: Self-pay | Attending: Family Medicine | Admitting: Pharmacist

## 2019-09-07 ENCOUNTER — Ambulatory Visit: Payer: Self-pay | Admitting: Critical Care Medicine

## 2019-09-07 ENCOUNTER — Other Ambulatory Visit: Payer: Self-pay

## 2019-09-07 DIAGNOSIS — Z23 Encounter for immunization: Secondary | ICD-10-CM

## 2019-09-07 DIAGNOSIS — Z7189 Other specified counseling: Secondary | ICD-10-CM

## 2019-09-07 MED FILL — !TRUE METRIX BLOOD GLUCOSE: 30 days supply | Qty: 1 | Fill #0

## 2019-09-07 NOTE — Progress Notes (Signed)
Patient was educated on the use of the True Metrix blood glucose meter. Reviewed necessary supplies and operation of the meter. Also reviewed goal blood glucose levels. Patient was able to demonstrate use. All questions and concerns were addressed.  Patient presents for vaccination against influenza per orders of Dr. Joya Gaskins. Consent given. Counseling provided. No contraindications exists. Vaccine administered without incident.

## 2019-09-13 ENCOUNTER — Encounter (HOSPITAL_COMMUNITY): Payer: Self-pay

## 2019-09-13 ENCOUNTER — Emergency Department (HOSPITAL_COMMUNITY): Payer: Medicaid Other

## 2019-09-13 ENCOUNTER — Emergency Department (HOSPITAL_COMMUNITY)
Admission: EM | Admit: 2019-09-13 | Discharge: 2019-09-13 | Disposition: A | Discharge status: Home / Self Care | Payer: Medicaid Other | Attending: Emergency Medicine | Admitting: Emergency Medicine

## 2019-09-13 ENCOUNTER — Other Ambulatory Visit: Payer: Self-pay

## 2019-09-13 DIAGNOSIS — M25511 Pain in right shoulder: Secondary | ICD-10-CM | POA: Insufficient documentation

## 2019-09-13 DIAGNOSIS — F17210 Nicotine dependence, cigarettes, uncomplicated: Secondary | ICD-10-CM | POA: Diagnosis not present

## 2019-09-13 DIAGNOSIS — Z7982 Long term (current) use of aspirin: Secondary | ICD-10-CM | POA: Diagnosis not present

## 2019-09-13 DIAGNOSIS — G8929 Other chronic pain: Secondary | ICD-10-CM | POA: Insufficient documentation

## 2019-09-13 DIAGNOSIS — I10 Essential (primary) hypertension: Secondary | ICD-10-CM | POA: Diagnosis not present

## 2019-09-13 DIAGNOSIS — Z794 Long term (current) use of insulin: Secondary | ICD-10-CM | POA: Insufficient documentation

## 2019-09-13 DIAGNOSIS — Z79899 Other long term (current) drug therapy: Secondary | ICD-10-CM | POA: Diagnosis not present

## 2019-09-13 MED ORDER — IBUPROFEN 800 MG PO TABS
800.0000 mg | ORAL_TABLET | Freq: Once | ORAL | Status: AC
  Administered 2019-09-13: 800 mg via ORAL
  Filled 2019-09-13: qty 1

## 2019-09-13 MED ORDER — IBUPROFEN 800 MG PO TABS: 800.0000 mg | ORAL_TABLET | Freq: Three times a day (TID) | ORAL | 0 refills | Status: DC | PRN

## 2019-09-13 NOTE — ED Triage Notes (Signed)
Pt BIB [facility] c/o chronic R shoulder pain x 6 years. A&Ox4. Ambulatory.

## 2019-09-13 NOTE — ED Provider Notes (Signed)
TIME SEEN: 2:22 AM  CHIEF COMPLAINT: Right shoulder pain  HPI: Patient is a 58 year old right-hand-dominant male with history of hypertension, diabetes, hyperlipidemia, schizophrenia who presents the emergency department with complaints of right shoulder pain for the past 6 to 7 months.  He denies any known injury but states pain started while he was in prison.  He was released from prison in July 2020.  States while in prison he was taking ibuprofen which helped his pain significantly.  States pain is worse with lifting his arm above his head.  Unable to lift heavy things secondary to pain.  States he was told that this was secondary to his rotator cuff and that he would need surgery.  He does not have a local orthopedic physician.  States he does not have money to afford Tylenol or ibuprofen.  States he does not have money to afford a visit with the PCP or orthopedic physician.  ROS: See HPI Constitutional: no fever  Eyes: no drainage  ENT: no runny nose   Cardiovascular:  no chest pain  Resp: no SOB  GI: no vomiting GU: no dysuria Integumentary: no rash  Allergy: no hives  Musculoskeletal: no leg swelling  Neurological: no slurred speech ROS otherwise negative  PAST MEDICAL HISTORY/PAST SURGICAL HISTORY:  Past Medical History:  Diagnosis Date  . Diabetes mellitus   . Diabetic neuropathy ([place])   . High cholesterol   . Hypertension   . Schizophrenia ([place])     MEDICATIONS:  Prior to Admission medications   Medication Sig Start Date End Date Taking? Authorizing Provider  albuterol (VENTOLIN HFA) 108 (90 Base) MCG/ACT inhaler Inhale 2 puffs into the lungs every 4 (four) hours as needed for wheezing or shortness of breath. 07/29/19  Yes Elsie Stain, MD  amLODipine (NORVASC) 10 MG tablet Take 1 tablet (10 mg total) by mouth daily. 08/17/19  Yes Elsie Stain, MD  aspirin EC 81 MG tablet Take 1 tablet (81 mg total) by mouth daily. 07/22/19  Yes Elsie Stain, MD  atorvastatin  (LIPITOR) 20 MG tablet Take 1 tablet (20 mg total) by mouth daily. Reported on 05/01/2016 08/17/19  Yes Elsie Stain, MD  gabapentin (NEURONTIN) 300 MG capsule Take 1 capsule (300 mg total) by mouth 3 (three) times daily. 08/19/19  Yes Elsie Stain, MD  hydrochlorothiazide (HYDRODIURIL) 25 MG tablet Take 1 tablet (25 mg total) by mouth daily. 08/17/19  Yes Elsie Stain, MD  Insulin Glargine (LANTUS SOLOSTAR) 100 UNIT/ML Solostar Pen Inject 35 Units into the skin at bedtime. 08/17/19  Yes Elsie Stain, MD  metFORMIN (GLUCOPHAGE) 1000 MG tablet Take 1 tablet (1,000 mg total) by mouth 2 (two) times daily with a meal. 08/17/19  Yes Elsie Stain, MD  OLANZapine (ZYPREXA) 15 MG tablet Take 1 tablet (15 mg total) by mouth at bedtime. 07/22/19  Yes Elsie Stain, MD  benzonatate (TESSALON PERLES) 100 MG capsule Take 1 capsule (100 mg total) by mouth 3 (three) times daily as needed for cough. 12/16/16   Charlann Lange, PA-C  Blood Glucose Monitoring Suppl (TRUE METRIX METER) w/Device KIT Use as instructed to check blood sugar daily. Patient not taking: Reported on 09/13/2019 09/03/19   Elsie Stain, MD  glucose blood (TRUE METRIX BLOOD GLUCOSE TEST) test strip Use as instructed to check blood sugar daily. 09/03/19   Elsie Stain, MD  Insulin Pen Needle 29G X 5MM MISC Use with Lantus pen 08/17/19   Elsie Stain,  MD  TRUEplus Lancets 28G MISC Use as instructed to check blood sugar daily. 09/03/19   Elsie Stain, MD    ALLERGIES:  Allergies  Allergen Reactions  . Benadryl [Diphenhydramine Hcl]   . Diphenhydramine     GI upset, paradoxic agitation  . Haldol [Haloperidol Lactate]   . Haldol [Haloperidol]     "makes me tremble, bite my tongue"  . Lisinopril Swelling  . Thorazine [Chlorpromazine]     Tremble, bite my tongue  . Thorazine [Chlorpromazine]     SOCIAL HISTORY:  Social History   Tobacco Use  . Smoking status: Current Every Day Smoker    Packs/day:  0.50    Types: Cigarettes  . Smokeless tobacco: Never Used  Substance Use Topics  . Alcohol use: No    FAMILY HISTORY: Family History  Problem Relation Age of Onset  . Hypertension Mother   . Cancer Father   . Alcoholism Other     EXAM: BP (!) 153/101 (BP Location: Left Arm)   Pulse 93   Temp (!) 96.8 F (36 C) (Axillary) Comment: pt said, "I don't want that thing in my mouth with those germs."  Resp 16   Ht 6' (1.829 m)   Wt 99.8 kg   SpO2 99%   BMI 29.84 kg/m  CONSTITUTIONAL: Alert and oriented and responds appropriately to questions. Well-appearing; well-nourished HEAD: Normocephalic EYES: Conjunctivae clear, pupils appear equal, EOMI ENT: normal nose; moist mucous membranes NECK: Supple, no meningismus, no nuchal rigidity, no LAD  CARD: RRR; S1 and S2 appreciated; no murmurs, no clicks, no rubs, no gallops RESP: Normal chest excursion without splinting or tachypnea; breath sounds clear and equal bilaterally; no wheezes, no rhonchi, no rales, no hypoxia or respiratory distress, speaking full sentences ABD/GI: Normal bowel sounds; non-distended; soft, non-tender, no rebound, no guarding, no peritoneal signs, no hepatosplenomegaly BACK:  The back appears normal and is non-tender to palpation, there is no CVA tenderness EXT: Normal ROM in all joints; non-tender to palpation; no edema; normal capillary refill; no cyanosis, no calf tenderness or swelling; pain throughout the shoulder with any passive range of motion but he has full range of motion in the shoulder, no bony tenderness or effusion noted of the right shoulder, no loss of fullness of the right shoulder, 2+ right radial pulse, normal right grip strength, sensation to light touch intact throughout the right upper extremity SKIN: Normal color for age and race; warm; no rash NEURO: Moves all extremities equally PSYCH: [name] mood and manner are appropriate. Grooming and personal hygiene are appropriate.  MEDICAL  DECISION MAKING: Patient here with right shoulder pain that he was told previously was secondary to rotator cuff tendinitis.  I recommended alternating Tylenol and Motrin.  Have provided him with outpatient resources for primary care physician as well as orthopedic follow-up as needed if symptoms or not improving with medical management with rest, ice, elevation, NSAIDs.  We will also consult social work to help patient with any outpatient needs including establishing outpatient care and affording his medications.  He was provided with a prescription for ibuprofen today.  His x-ray shows no acute abnormality.  Will discharge home.  No sign of septic arthritis, gout, fracture, dislocation.  He is neurovascularly intact distally.  At this time, I do not feel there is any life-threatening condition present. I have reviewed and discussed all results (EKG, imaging, lab, urine as appropriate) and exam findings with patient/family. I have reviewed nursing notes and appropriate previous records.  I feel the patient is safe to be discharged home without further emergent workup and can continue workup as an outpatient as needed. Discussed usual and customary return precautions. Patient/family verbalize understanding and are comfortable with this plan.  Outpatient follow-up has been provided as needed. All questions have been answered.      Serigne Kubicek, Delice Bison, DO 09/13/19 [phone]

## 2019-09-13 NOTE — Discharge Instructions (Addendum)
You may alternate Tylenol 1000 mg every 6 hours as needed for pain and Ibuprofen 800 mg every 8 hours as needed for pain.  Please take Ibuprofen with food.  Your x-ray showed no abnormality of your bone.  I recommend follow-up with orthopedics if symptoms of shoulder pain or not improving with rest, ice, alternating Tylenol and ibuprofen.  I have placed a social work consult to help with establishing outpatient care and help with affording medications.  Steps to find a Primary Care Provider (PCP):  Call [phone] or [phone] to access "[place] a Doctor Service."  2.  You may also go on the [facility] website at [email or web address]  3.   and Wellness also frequently accepts new patients.  [place] [phone]  4.  There are also multiple [facility], [facility] and [facility] practices throughout the [place] that are frequently accepting new patients. You may find a clinic that is close to your home and contact them.  [facility] [email or web address] [phone]  [facility] [email or web address]  [email or web address] [place] [email or web address] [phone]  5.  Local Health Departments also can provide primary care services.  [facility]  [place] [address] [phone]  [facility] [place] [address] [place] [facility] [place] [phone]

## 2019-09-16 ENCOUNTER — Encounter: Payer: Self-pay | Admitting: Critical Care Medicine

## 2019-09-16 ENCOUNTER — Other Ambulatory Visit: Payer: Self-pay | Admitting: Critical Care Medicine

## 2019-09-16 MED ORDER — IBUPROFEN 800 MG PO TABS: 800.0000 mg | ORAL_TABLET | Freq: Three times a day (TID) | ORAL | 0 refills | Status: DC | PRN

## 2019-09-16 MED FILL — !LANTUS SOLOSTAR 100UNITS/M: 100 | 40 days supply | Qty: 15 | Fill #1

## 2019-09-16 MED FILL — IBUPROFEN 800 MG TABLET: 800 | 10 days supply | Qty: 30 | Fill #0

## 2019-09-16 NOTE — Progress Notes (Signed)
Ibuprofen for shoulder pain

## 2019-09-17 ENCOUNTER — Telehealth: Payer: Self-pay

## 2019-09-17 NOTE — Telephone Encounter (Signed)
[name], [place] provided letter from [employer] confirming patient's homelessness. [name] was given a [employer] for patient/  She was also provided information to assist patient with obtaining IRS non -filing letter for 2019 when he was not in prison.  This is needed to complete [facility].

## 2019-09-17 NOTE — Progress Notes (Signed)
Patient ID: [name], male   DOB: 1961/10/14, 58 y.o.   MRN: [id number] This patient was seen in the [place] [facility].  He complains of chronic right shoulder pain and prior rotator cuff injury while in prison.  He actually went to the emergency room a week ago for this complaint and was given a prescription for ibuprofen 800 mg however he lost the prescription and did not fill it.  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

## 2019-09-18 ENCOUNTER — Other Ambulatory Visit: Payer: Self-pay

## 2019-09-18 DIAGNOSIS — Z20822 Contact with and (suspected) exposure to covid-19: Secondary | ICD-10-CM

## 2019-09-19 LAB — NOVEL CORONAVIRUS, NAA: SARS-CoV-2, NAA: NOT DETECTED

## 2019-09-25 MED FILL — GABAPENTIN 300 MG CAPSULE: 300 | 30 days supply | Qty: 90 | Fill #1

## 2019-09-28 ENCOUNTER — Ambulatory Visit: Payer: Self-pay | Admitting: Critical Care Medicine

## 2019-10-07 ENCOUNTER — Other Ambulatory Visit: Payer: Self-pay | Admitting: Critical Care Medicine

## 2019-10-07 ENCOUNTER — Telehealth: Payer: Self-pay

## 2019-10-07 MED ORDER — OLANZAPINE 15 MG PO TABS: 15.0000 mg | ORAL_TABLET | Freq: Every day | ORAL | 1 refills | Status: DC

## 2019-10-07 MED ORDER — ATORVASTATIN CALCIUM 20 MG PO TABS: 20.0000 mg | ORAL_TABLET | Freq: Every day | ORAL | 3 refills | Status: DC

## 2019-10-07 MED ORDER — AMLODIPINE BESYLATE 10 MG PO TABS: 10.0000 mg | ORAL_TABLET | Freq: Every day | ORAL | 3 refills | Status: DC

## 2019-10-07 MED ORDER — HYDROCHLOROTHIAZIDE 25 MG PO TABS: 25.0000 mg | ORAL_TABLET | Freq: Every day | ORAL | 3 refills | Status: DC

## 2019-10-07 MED ORDER — METFORMIN HCL 1000 MG PO TABS: 1000.0000 mg | ORAL_TABLET | Freq: Two times a day (BID) | ORAL | 1 refills | Status: DC

## 2019-10-07 MED ORDER — ASPIRIN EC 81 MG PO TBEC: 81.0000 mg | DELAYED_RELEASE_TABLET | Freq: Every day | ORAL | 1 refills | Status: DC

## 2019-10-07 MED ORDER — LANTUS SOLOSTAR 100 UNIT/ML ~~LOC~~ SOPN: 35.0000 [IU] | PEN_INJECTOR | Freq: Every day | SUBCUTANEOUS | 11 refills | Status: DC

## 2019-10-07 MED ORDER — ALBUTEROL SULFATE HFA 108 (90 BASE) MCG/ACT IN AERS: 2.0000 | INHALATION_SPRAY | RESPIRATORY_TRACT | 0 refills | Status: DC | PRN

## 2019-10-07 MED ORDER — INSULIN PEN NEEDLE 29G X 5MM MISC: 0 refills | Status: DC

## 2019-10-07 MED FILL — metFORMIN HCL 1000 MG TABS: 1000 | 30 days supply | Qty: 60 | Fill #0

## 2019-10-07 MED FILL — ?AMLODIPINE BESYLATE 10 MG: 10 | 30 days supply | Qty: 30 | Fill #0

## 2019-10-07 MED FILL — ?HYDROCHLOROTHIAZIDE 25MG T: 25 | 30 days supply | Qty: 30 | Fill #0

## 2019-10-07 MED FILL — ?ATORVASTATIN 20 MG TABLET: 20 | 30 days supply | Qty: 30 | Fill #0

## 2019-10-07 MED FILL — OLANZapine 15 MG TABS: 15 | 30 days supply | Qty: 30 | Fill #0

## 2019-10-07 MED FILL — ALBUTEROL SULFATE HFA 108 (: 108 (90 BAS | 16 days supply | Qty: 18 | Fill #0

## 2019-10-07 NOTE — Telephone Encounter (Signed)
Refills all sent in

## 2019-10-07 NOTE — Telephone Encounter (Signed)
Patient is here and he is requesting refills on all medications sent to onsite pharmacy.  Patient states that his medication was lost and he has not found it yet.

## 2019-10-07 NOTE — Progress Notes (Signed)
Needs refills

## 2019-10-14 MED FILL — ?BASAGLAR 100 UNITS/ML KWPE: 100 | 34 days supply | Qty: 12 | Fill #2

## 2019-10-19 ENCOUNTER — Ambulatory Visit: Payer: Self-pay | Admitting: Critical Care Medicine

## 2019-10-19 NOTE — Progress Notes (Deleted)
 Subjective:    Patient ID: [name], male    DOB: 05/25/1961, 58 y.o.   MRN: [id number]  This is a 58-year-old male seen recently in the [facility] and was released from prison recently has history of hypertension type 2 diabetes and bipolar disorder.  The patient has had now given access to Lantus and was taking 35 units at bedtime patient also maintains atorvastatin, hydrochlorthiazide,Metformin, and amlodipine.  The patient notes occasional burning in the feet.  He had been on gabapentin previously but is not on gabapentin at this time.  I did resume Zyprexa on this patient 15 mg at bedtime and he has an upcoming appointment with mental health at [facility].  Glucoses have been in the 170 to as high as 300 after meals.  Denies any chest pain or dyspnea.   10/19/2019 Since last OV was [facility], prior rotator cuff injury.   Needs Ortho and financial assistance    Past Medical History:  Diagnosis Date  . Diabetes mellitus   . Diabetic neuropathy (HCC)   . High cholesterol   . Hypertension   . Schizophrenia (HCC)      Family History  Problem Relation Age of Onset  . Hypertension Mother   . Cancer Father   . Alcoholism Other      Social History   Socioeconomic History  . Marital status: Single    Spouse name: Not on file  . Number of children: Not on file  . Years of education: Not on file  . Highest education level: Not on file  Occupational History  . Not on file  Social Needs  . Financial resource strain: Not on file  . Food insecurity    Worry: Not on file    Inability: Not on file  . Transportation needs    Medical: Not on file    Non-medical: Not on file  Tobacco Use  . Smoking status: Current Every Day Smoker    Packs/day: 0.50    Types: Cigarettes  . Smokeless tobacco: Never Used  Substance and Sexual Activity  . Alcohol use: No  . Drug use: No  . Sexual activity: Yes  Lifestyle  . Physical  activity    Days per week: Not on file    Minutes per session: Not on file  . Stress: Not on file  Relationships  . Social connections    Talks on phone: Not on file    Gets together: Not on file    Attends religious service: Not on file    Active member of club or organization: Not on file    Attends meetings of clubs or organizations: Not on file    Relationship status: Not on file  . Intimate partner violence    Fear of current or ex partner: Not on file    Emotionally abused: Not on file    Physically abused: Not on file    Forced sexual activity: Not on file  Other Topics Concern  . Not on file  Social History Narrative  . Not on file     Allergies  Allergen Reactions  . Benadryl [Diphenhydramine Hcl]   . Diphenhydramine     GI upset, paradoxic agitation  . Haldol [Haloperidol Lactate]   . Haldol [Haloperidol]     "makes me tremble, bite my tongue"  . Lisinopril Swelling  . Thorazine [Chlorpromazine]     Tremble, bite my tongue  . Thorazine [Chlorpromazine]        Outpatient Medications Prior to Visit  Medication Sig Dispense Refill  . albuterol (VENTOLIN HFA) 108 (90 Base) MCG/ACT inhaler Inhale 2 puffs into the lungs every 4 (four) hours as needed for wheezing or shortness of breath. 18 g 0  . amLODipine (NORVASC) 10 MG tablet Take 1 tablet (10 mg total) by mouth daily. 90 tablet 3  . aspirin EC 81 MG tablet Take 1 tablet (81 mg total) by mouth daily. 30 tablet 1  . atorvastatin (LIPITOR) 20 MG tablet Take 1 tablet (20 mg total) by mouth daily. Reported on 05/01/2016 90 tablet 3  . benzonatate (TESSALON PERLES) 100 MG capsule Take 1 capsule (100 mg total) by mouth 3 (three) times daily as needed for cough. 20 capsule 0  . Blood Glucose Monitoring Suppl (TRUE METRIX METER) w/Device KIT Use as instructed to check blood sugar daily. (Patient not taking: Reported on 09/13/2019) 1 kit 0  . gabapentin (NEURONTIN) 300 MG capsule Take 1 capsule (300 mg total) by mouth 3 (three)  times daily. 90 capsule 3  . glucose blood (TRUE METRIX BLOOD GLUCOSE TEST) test strip Use as instructed to check blood sugar daily. 100 each 12  . hydrochlorothiazide (HYDRODIURIL) 25 MG tablet Take 1 tablet (25 mg total) by mouth daily. 30 tablet 3  . ibuprofen (ADVIL) 800 MG tablet Take 1 tablet (800 mg total) by mouth every 8 (eight) hours as needed for mild pain. 30 tablet 0  . Insulin Glargine (LANTUS SOLOSTAR) 100 UNIT/ML Solostar Pen Inject 35 Units into the skin at bedtime. 5 pen 11  . Insulin Pen Needle 29G X 5MM MISC Use with Lantus pen 100 each 0  . metFORMIN (GLUCOPHAGE) 1000 MG tablet Take 1 tablet (1,000 mg total) by mouth 2 (two) times daily with a meal. 60 tablet 1  . OLANZapine (ZYPREXA) 15 MG tablet Take 1 tablet (15 mg total) by mouth at bedtime. 30 tablet 1  . TRUEplus Lancets 28G MISC Use as instructed to check blood sugar daily. 100 each 11   No facility-administered medications prior to visit.    Review of Systems  Pos In BOLD  Constitutional:   No  weight loss, night sweats,  Fevers, chills, fatigue, lassitude. HEENT:   No headaches,  Difficulty swallowing,  Tooth/dental problems,  Sore throat,                No sneezing, itching, ear ache, nasal congestion, post nasal drip,   CV:  No chest pain,  Orthopnea, PND, swelling in lower extremities, anasarca, dizziness, palpitations  GI  No heartburn, indigestion, abdominal pain, nausea, vomiting, diarrhea, change in bowel habits, loss of appetite  Resp: No shortness of breath with exertion or at rest.  No excess mucus, no productive cough,  No non-productive cough,  No coughing up of blood.  No change in color of mucus.  No wheezing.  No chest wall deformity  Skin: no rash or lesions.  GU: no dysuria, change in color of urine, no urgency or frequency.  No flank pain.  MS:  No joint pain or swelling.  No decreased range of motion.  No back pain.  FOOT PAIN bilateral   Psych:  No change in mood or affect. No  depression or anxiety.  No memory loss.     Objective:   Physical Exam There were no vitals filed for this visit.  Gen: Pleasant, well-nourished, in no distress,  normal affect  ENT: No lesions,  mouth clear,  oropharynx clear, no postnasal drip  Neck: No JVD, no TMG, no carotid bruits  Lungs: No use of accessory muscles, no dullness to percussion, clear without rales or rhonchi  Cardiovascular: RRR, heart sounds normal, no murmur or gallops, no peripheral edema  Abdomen: soft and NT, no HSM,  BS normal  Musculoskeletal: No deformities, no cyanosis or clubbing  Neuro: alert, non focal  Skin: Warm, no lesions or rashes FOOT EXAM  Normal pulses, no ulcerations, decreased sensation both feet.  Severe toenail fungus  All labs reviewed in [facility]    Assessment & Plan:  I personally reviewed all images and lab data in the [facility] system as well as any outside material available during this office visit and agree with the  radiology impressions.   No problem-specific Assessment & Plan notes found for this encounter.   There are no diagnoses linked to this encounter.Will give Tdap   Pt refused Flu vaccine.   Fecal Blood sent.   

## 2019-10-21 ENCOUNTER — Encounter: Payer: Self-pay | Admitting: Critical Care Medicine

## 2019-10-21 MED FILL — GABAPENTIN 300 MG CAPSULE: 300 | 30 days supply | Qty: 90 | Fill #2

## 2019-10-21 MED FILL — TRUEplus LANCETS 28G MISC: 25 days supply | Qty: 100 | Fill #0

## 2019-10-21 MED FILL — TRUE METRIX TEST STRIP: 25 days supply | Qty: 100 | Fill #0

## 2019-10-21 MED FILL — ?AMLODIPINE BESYLATE 10 MG: 10 | 30 days supply | Qty: 30 | Fill #1

## 2019-10-21 MED FILL — ?ATORVASTATIN 20 MG TABLET: 20 | 30 days supply | Qty: 30 | Fill #1

## 2019-10-21 MED FILL — OLANZapine 15 MG TABS: 15 | 30 days supply | Qty: 30 | Fill #0

## 2019-10-22 NOTE — Progress Notes (Unsigned)
Patient ID: [name], male   DOB: 1961/03/15, 58 y.o.   MRN: [id number]  This patient is a 58 year old male seen in the [facility] clinic today.  The patient needs medication refills.  He [facility] appointment [facility]  Blood pressure 132/74 remainder of exam is unchanged.  Impression is that of diabetes and hypertension in need of refills on his hydrocal thiazide, lancets and testing strips for glucose, and his Zyprexa.  [facility] and requested early refills for this patient.  Apparently has had additional adjustments in his psychiatric meds and I will try to contact [facility] to see which medications are being recommended as he needs some support in our pharmacy for these medications.  We will also ensure the patient has a follow-up appointment with me as well.

## 2019-11-07 NOTE — Progress Notes (Deleted)
Subjective:    Patient ID: [name], male    DOB: October 07, 1961, 58 y.o.   MRN: [id number]  This is a 58 year old male seen recently in the [facility] and was released from prison recently has history of hypertension type 2 diabetes and bipolar disorder.  The patient has had now given access to Lantus and was taking 35 units at bedtime patient also maintains atorvastatin, hydrochlorthiazide,Metformin, and amlodipine.  The patient notes occasional burning in the feet.  He had been on gabapentin previously but is not on gabapentin at this time.  I did resume Zyprexa on this patient 15 mg at bedtime and he has an upcoming appointment with mental health at [facility].  Glucoses have been in the 170 to as high as 300 after meals.  Denies any chest pain or dyspnea.  Pt last seen in [facility]:  This patient is a 58 year old male seen in the [place] clinic today.  The patient needs medication refills.  He missed his appointment with me on [facility].  Blood pressure 132/74 remainder of exam is unchanged.  Impression is that of diabetes and hypertension in need of refills on his hydrocal thiazide, lancets and testing strips for glucose, and his Zyprexa.  [facility] and requested early refills for this patient.  Apparently has had additional adjustments in his psychiatric meds and I will try to contact [facility] to see which medications are being recommended as he needs some support in our pharmacy for these medications.  We will also ensure the patient has a follow-up appointment with me as well.  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  Diagnoses and all orders for this visit:  Past Medical History:  Diagnosis Date  . Diabetes mellitus   . Diabetic neuropathy ([place])   . High cholesterol   . Hypertension   . Schizophrenia ([place])      Family History  Problem Relation Age of Onset  . Hypertension Mother   . Cancer Father   . Alcoholism Other      Social History   Socioeconomic History  . Marital status: Single    Spouse name: Not on file  . Number of children: Not on file  . Years of education: Not on file  . Highest education level: Not on file  Occupational History  . Not on file  Social Needs  . Financial resource strain: Not on file  . Food insecurity    Worry: Not on file    Inability: Not on file  . Transportation needs    Medical: Not on file    Non-medical: Not on file  Tobacco Use  . Smoking status: Current Every Day Smoker    Packs/day: 0.50    Types: Cigarettes  . Smokeless tobacco:  Never Used  Substance and Sexual Activity  . Alcohol use: No  . Drug use: No  . Sexual activity: Yes  Lifestyle  . Physical activity    Days per week: Not on file    Minutes per session: Not on file  . Stress: Not on file  Relationships  . Social Herbalist on phone: Not on file    Gets together: Not on file    Attends religious service: Not on file    Active member of club or organization: Not on file    Attends meetings of clubs or organizations: Not on file    Relationship status: Not on file  .  Intimate partner violence    Fear of current or ex partner: Not on file    Emotionally abused: Not on file    Physically abused: Not on file    Forced sexual activity: Not on file  Other Topics Concern  . Not on file  Social History Narrative  . Not on file     Allergies  Allergen Reactions  . Benadryl [Diphenhydramine Hcl]   . Diphenhydramine     GI upset, paradoxic agitation  . Haldol [Haloperidol Lactate]   . Haldol [Haloperidol]     "makes me tremble, bite my tongue"  . Lisinopril Swelling  . Thorazine [Chlorpromazine]     Tremble, bite my tongue  . Thorazine [Chlorpromazine]      Outpatient Medications Prior to Visit  Medication Sig Dispense Refill  . albuterol (VENTOLIN HFA) 108 (90 Base) MCG/ACT inhaler Inhale 2 puffs into the lungs every 4 (four) hours as needed for wheezing or shortness of breath. 18 g 0  . amLODipine (NORVASC) 10 MG tablet Take 1 tablet (10 mg total) by mouth daily. 90 tablet 3  . aspirin EC 81 MG tablet Take 1 tablet (81 mg total) by mouth daily. 30 tablet 1  . atorvastatin (LIPITOR) 20 MG tablet Take 1 tablet (20 mg total) by mouth daily. Reported on 05/01/2016 90 tablet 3  . benzonatate (TESSALON PERLES) 100 MG capsule Take 1 capsule (100 mg total) by mouth 3 (three) times daily as needed for cough. 20 capsule 0  . Blood Glucose Monitoring Suppl (TRUE METRIX METER) w/Device KIT Use as instructed to check blood sugar daily. (Patient not taking: Reported on 09/13/2019) 1 kit 0  . gabapentin (NEURONTIN) 300 MG capsule Take 1 capsule (300 mg total) by mouth 3 (three) times daily. 90 capsule 3  . glucose blood (TRUE METRIX BLOOD GLUCOSE TEST) test strip Use as instructed to check blood sugar daily. 100 each 12  . hydrochlorothiazide (HYDRODIURIL) 25 MG tablet Take 1 tablet (25 mg total) by mouth daily. 30 tablet 3  . ibuprofen (ADVIL) 800 MG tablet Take 1 tablet (800 mg total) by mouth every 8 (eight) hours as needed for mild pain. 30 tablet 0  . Insulin  Glargine (LANTUS SOLOSTAR) 100 UNIT/ML Solostar Pen Inject 35 Units into the skin at bedtime. 5 pen 11  . Insulin Pen Needle 29G X 5MM MISC Use with Lantus pen 100 each 0  . metFORMIN (GLUCOPHAGE) 1000 MG tablet Take 1 tablet (1,000 mg total) by mouth 2 (two) times daily with a meal. 60 tablet 1  . OLANZapine (ZYPREXA) 15 MG tablet Take 1 tablet (15 mg total) by mouth at bedtime. 30 tablet 1  . TRUEplus Lancets 28G MISC Use as instructed to check blood sugar daily. 100 each 11   No facility-administered medications prior to  visit.    Review of Systems  Pos In BOLD  Constitutional:   No  weight loss, night sweats,  Fevers, chills, fatigue, lassitude. HEENT:   No headaches,  Difficulty swallowing,  Tooth/dental problems,  Sore throat,                No sneezing, itching, ear ache, nasal congestion, post nasal drip,   CV:  No chest pain,  Orthopnea, PND, swelling in lower extremities, anasarca, dizziness, palpitations  GI  No heartburn, indigestion, abdominal pain, nausea, vomiting, diarrhea, change in bowel habits, loss of appetite  Resp: No shortness of breath with exertion or at rest.  No excess mucus, no productive cough,  No non-productive cough,  No coughing up of blood.  No change in color of mucus.  No wheezing.  No chest wall deformity  Skin: no rash or lesions.  GU: no dysuria, change in color of urine, no urgency or frequency.  No flank pain.  MS:  No joint pain or swelling.  No decreased range of motion.  No back pain.  FOOT PAIN bilateral   Psych:  No change in mood or affect. No depression or anxiety.  No memory loss.     Objective:   Physical Exam There were no vitals filed for this visit.  Gen: Pleasant, well-nourished, in no distress,  normal affect  ENT: No lesions,  mouth clear,  oropharynx clear, no postnasal drip  Neck: No JVD, no TMG, no carotid bruits  Lungs: No use of accessory muscles, no dullness to percussion, clear without rales or rhonchi   Cardiovascular: RRR, heart sounds normal, no murmur or gallops, no peripheral edema  Abdomen: soft and NT, no HSM,  BS normal  Musculoskeletal: No deformities, no cyanosis or clubbing  Neuro: alert, non focal  Skin: Warm, no lesions or rashes FOOT EXAM  Normal pulses, no ulcerations, decreased sensation both feet.  Severe toenail fungus  All labs reviewed in [facility]    Assessment & Plan:  I personally reviewed all images and lab data in the [facility] system as well as any outside material available during this office visit and agree with the  radiology impressions.   No problem-specific Assessment & Plan notes found for this encounter.   There are no diagnoses linked to this encounter.Will give Tdap   Pt refused Flu vaccine.   Fecal Blood sent.

## 2019-11-09 ENCOUNTER — Ambulatory Visit: Payer: Self-pay | Admitting: Critical Care Medicine

## 2019-11-09 DIAGNOSIS — E119 Type 2 diabetes mellitus without complications: Secondary | ICD-10-CM | POA: Insufficient documentation

## 2019-11-09 DIAGNOSIS — E1165 Type 2 diabetes mellitus with hyperglycemia: Secondary | ICD-10-CM | POA: Insufficient documentation

## 2019-11-23 MED FILL — OLANZapine 15 MG TABS: 15 | 30 days supply | Qty: 30 | Fill #1

## 2019-11-23 MED FILL — GABAPENTIN 300 MG CAPSULE: 300 | 30 days supply | Qty: 90 | Fill #3

## 2019-11-23 MED FILL — ?AMLODIPINE BESYLATE 10 MG: 10 | 30 days supply | Qty: 30 | Fill #2

## 2019-11-23 MED FILL — metFORMIN HCL 1000 MG TABS: 1000 | 30 days supply | Qty: 60 | Fill #1

## 2019-11-24 ENCOUNTER — Other Ambulatory Visit: Payer: Self-pay | Admitting: Critical Care Medicine

## 2019-11-24 MED FILL — ?HYDROCHLOROTHIAZIDE 25MG T: 25 | 30 days supply | Qty: 30 | Fill #1

## 2019-11-25 ENCOUNTER — Ambulatory Visit: Payer: Self-pay | Admitting: Podiatry

## 2019-11-25 MED FILL — !VENTOLIN HFA INHALER: 108 (90 BAS | 16 days supply | Qty: 18 | Fill #0

## 2019-12-23 ENCOUNTER — Other Ambulatory Visit: Payer: Self-pay | Admitting: Critical Care Medicine

## 2019-12-23 MED FILL — GABAPENTIN 300 MG CAPSULE: 300 | 30 days supply | Qty: 90 | Fill #0

## 2019-12-23 MED FILL — OLANZapine 15 MG TABS: 15 | 30 days supply | Qty: 30 | Fill #1

## 2019-12-23 MED FILL — metFORMIN HCL 1000 MG TABS: 1000 | 30 days supply | Qty: 60 | Fill #1

## 2019-12-28 MED FILL — HYDROCHLOROTHIAZIDE 25 MG T: 25 | 30 days supply | Qty: 30 | Fill #1

## 2019-12-28 MED FILL — !VENTOLIN HFA INHALER: 108 (90 BAS | 16 days supply | Qty: 18 | Fill #0

## 2020-01-18 ENCOUNTER — Telehealth: Payer: Self-pay | Admitting: Critical Care Medicine

## 2020-01-18 NOTE — Telephone Encounter (Signed)
Patient called and requested to speak with pcp, regarding him testing positive for covid 19, patient wanted to come in the office and be checked for his DM, htn, and other health problems. Patient was informed of the office protocol regarding covid. Please fu at your earliest convenience.

## 2020-01-19 NOTE — Telephone Encounter (Signed)
Please add him as a televisit phone ok.  For tomorrow

## 2020-01-20 ENCOUNTER — Other Ambulatory Visit: Payer: Self-pay | Admitting: Critical Care Medicine

## 2020-01-20 ENCOUNTER — Ambulatory Visit: Payer: Medicaid Other | Attending: Critical Care Medicine | Admitting: Critical Care Medicine

## 2020-01-20 ENCOUNTER — Encounter: Payer: Self-pay | Admitting: Critical Care Medicine

## 2020-01-20 ENCOUNTER — Other Ambulatory Visit: Payer: Self-pay

## 2020-01-20 DIAGNOSIS — E1165 Type 2 diabetes mellitus with hyperglycemia: Secondary | ICD-10-CM

## 2020-01-20 DIAGNOSIS — U071 COVID-19: Secondary | ICD-10-CM

## 2020-01-20 DIAGNOSIS — E1149 Type 2 diabetes mellitus with other diabetic neurological complication: Secondary | ICD-10-CM | POA: Diagnosis not present

## 2020-01-20 DIAGNOSIS — I10 Essential (primary) hypertension: Secondary | ICD-10-CM

## 2020-01-20 DIAGNOSIS — E1100 Type 2 diabetes mellitus with hyperosmolarity without nonketotic hyperglycemic-hyperosmolar coma (NKHHC): Secondary | ICD-10-CM

## 2020-01-20 DIAGNOSIS — Z794 Long term (current) use of insulin: Secondary | ICD-10-CM

## 2020-01-20 HISTORY — DX: COVID-19: U07.1

## 2020-01-20 MED ORDER — HYDROCHLOROTHIAZIDE 25 MG PO TABS: 25.0000 mg | ORAL_TABLET | Freq: Every day | ORAL | 3 refills | Status: DC

## 2020-01-20 MED ORDER — AMLODIPINE BESYLATE 10 MG PO TABS: 10.0000 mg | ORAL_TABLET | Freq: Every day | ORAL | 3 refills | Status: DC

## 2020-01-20 MED ORDER — ATORVASTATIN CALCIUM 20 MG PO TABS: 20.0000 mg | ORAL_TABLET | Freq: Every day | ORAL | 3 refills | Status: DC

## 2020-01-20 MED ORDER — ASPIRIN EC 81 MG PO TBEC: 81.0000 mg | DELAYED_RELEASE_TABLET | Freq: Every day | ORAL | 1 refills | Status: DC

## 2020-01-20 MED ORDER — TRUEPLUS LANCETS 28G MISC: 11 refills | Status: DC

## 2020-01-20 MED ORDER — TRUE METRIX BLOOD GLUCOSE TEST VI STRP: ORAL_STRIP | 12 refills | Status: DC

## 2020-01-20 MED ORDER — OLANZAPINE 15 MG PO TABS: 15.0000 mg | ORAL_TABLET | Freq: Every day | ORAL | 1 refills | Status: DC

## 2020-01-20 MED ORDER — LANTUS SOLOSTAR 100 UNIT/ML ~~LOC~~ SOPN: 35.0000 [IU] | PEN_INJECTOR | Freq: Every day | SUBCUTANEOUS | 11 refills | Status: DC

## 2020-01-20 MED ORDER — GABAPENTIN 300 MG PO CAPS: 300.0000 mg | ORAL_CAPSULE | Freq: Three times a day (TID) | ORAL | 3 refills | Status: DC

## 2020-01-20 MED ORDER — INSULIN PEN NEEDLE 29G X 5MM MISC: 0 refills | Status: DC

## 2020-01-20 MED ORDER — METFORMIN HCL 1000 MG PO TABS: 1000.0000 mg | ORAL_TABLET | Freq: Two times a day (BID) | ORAL | 0 refills | Status: DC

## 2020-01-20 NOTE — Progress Notes (Unsigned)
  I connected by phone with [name] on 01/20/2020 at 10:02 AM to discuss the potential use of an new treatment for mild to moderate COVID-19 viral infection in non-hospitalized patients.  This patient is a 59 y.o. male that meets the FDA criteria for Emergency Use Authorization of bamlanivimab or casirivimab\imdevimab.  Has a (+) direct SARS-CoV-2 viral test result  Has mild or moderate COVID-19   Is ? 59 years of age and weighs ? 40 kg  Is NOT hospitalized due to COVID-19  Is NOT requiring oxygen therapy or requiring an increase in baseline oxygen flow rate due to COVID-19  Is within 10 days of symptom onset  Has at least one of the high risk factor(s) for progression to severe COVID-19 and/or hospitalization as defined in EUA.  Specific high risk criteria : Diabetes HTN   I have spoken and communicated the following to the patient or parent/caregiver:  1. FDA has authorized the emergency use of bamlanivimab and casirivimab\imdevimab for the treatment of mild to moderate COVID-19 in adults and pediatric patients with positive results of direct SARS-CoV-2 viral testing who are 69 years of age and older weighing at least 40 kg, and who are at high risk for progressing to severe COVID-19 and/or hospitalization.  2. The significant known and potential risks and benefits of bamlanivimab and casirivimab\imdevimab, and the extent to which such potential risks and benefits are unknown.  3. Information on available alternative treatments and the risks and benefits of those alternatives, including clinical trials.  4. Patients treated with bamlanivimab and casirivimab\imdevimab should continue to self-isolate and use infection control measures (e.g., wear mask, isolate, social distance, avoid sharing personal items, clean and disinfect "high touch" surfaces, and frequent handwashing) according to CDC guidelines.   5. The patient or parent/caregiver has the option to accept or refuse  bamlanivimab or casirivimab\imdevimab .  After reviewing this information with the patient, The patient agreed to proceed with receiving the bamlanimivab infusion and will be provided a copy of the Fact sheet prior to receiving the infusion.[name] 01/20/2020 10:02 AM

## 2020-01-20 NOTE — Progress Notes (Signed)
Subjective:    Patient ID: [name], male    DOB: 20-Jan-1961, 59 y.o.   MRN: [id number] Virtual Visit via Telephone Note  I connected with Dontee Jaso on 01/20/20 at  9:30 AM EST by telephone and verified that I am speaking with the correct person using two identifiers.   Consent:  I discussed the limitations, risks, security and privacy concerns of performing an evaluation and management service by telephone and the availability of in person appointments. I also discussed with the patient that there may be a patient responsible charge related to this service. The patient expressed understanding and agreed to proceed.  Location of patient: The patient was at home  Location of provider: I was on the call my office  Persons participating in the televisit with the patient.   The patient was by himself however I did speak to the patient's daughter later and informed her of logistics of getting into the infusion    History of Present Illness:  This is a 59 year old male seen recently in the [facility] and was released from prison recently has history of hypertension type 2 diabetes and bipolar disorder.  The patient has had now given access to Lantus and was taking 35 units at bedtime patient also maintains atorvastatin, hydrochlorthiazide,Metformin, and amlodipine.  The patient notes occasional burning in the feet.  He had been on gabapentin previously but is not on gabapentin at this time.  I did resume Zyprexa on this patient 15 mg at bedtime and he has an upcoming appointment with mental health at [facility].  Glucoses have been in the 170 to as high as 300 after meals.  Denies any chest pain or dyspnea.  01/20/2020 This is a telehealth phone visit.  This patient formally was in the [place] but now has moved in with his daughter in [place].  He became ill on January 22 and was tested the same day and was Covid positive in [facility].  The  patient notes wheezing cough lightheadedness and significant fatigue at this time.  He did have also some diarrhea which is slowly resolving but still present.  The patient is also lost his sense of smell.  He also has headaches as well.  Patient denies significant shortness of breath.  He is currently on his fifth day of illness and he is a candidate for monoclonal antibody infusion secondary to his diabetes and hypertension and age  He does not monitor his blood sugar and he is run out of his Lantus  Past Medical History:  Diagnosis Date  . Diabetes mellitus   . Diabetic neuropathy ([place])   . High cholesterol   . Hypertension   . Schizophrenia ([place])      Family History  Problem Relation Age of Onset  . Hypertension Mother   . Cancer Father   . Alcoholism Other      Social History   Socioeconomic History  . Marital status: Single    Spouse name: Not on file  . Number of children: Not on file  . Years of education: Not on file  . Highest education level: Not on file  Occupational History  . Not on file  Tobacco Use  . Smoking status: Current Every Day Smoker    Packs/day: 0.50    Types: Cigarettes  . Smokeless tobacco: Never Used  Substance and Sexual Activity  . Alcohol use: No  . Drug use: No  . Sexual activity: Yes  Other Topics  Concern  . Not on file  Social History Narrative  . Not on file   Social Determinants of Health   Financial Resource Strain:   . Difficulty of Paying Living Expenses: Not on file  Food Insecurity:   . Worried About Charity fundraiser in the Last Year: Not on file  . Ran Out of Food in the Last Year: Not on file  Transportation Needs:   . Lack of Transportation (Medical): Not on file  . Lack of Transportation (Non-Medical): Not on file  Physical Activity:   . Days of Exercise per Week: Not on file  . Minutes of Exercise per Session: Not on file  Stress:   . Feeling of Stress : Not on file  Social Connections:   . Frequency of  Communication with Friends and Family: Not on file  . Frequency of Social Gatherings with Friends and Family: Not on file  . Attends Religious Services: Not on file  . Active Member of Clubs or Organizations: Not on file  . Attends Archivist Meetings: Not on file  . Marital Status: Not on file  Intimate Partner Violence:   . Fear of Current or Ex-Partner: Not on file  . Emotionally Abused: Not on file  . Physically Abused: Not on file  . Sexually Abused: Not on file     Allergies  Allergen Reactions  . Benadryl [Diphenhydramine Hcl]   . Diphenhydramine     GI upset, paradoxic agitation  . Haldol [Haloperidol Lactate]   . Haldol [Haloperidol]     "makes me tremble, bite my tongue"  . Lisinopril Swelling  . Thorazine [Chlorpromazine]     Tremble, bite my tongue  . Thorazine [Chlorpromazine]      Outpatient Medications Prior to Visit  Medication Sig Dispense Refill  . albuterol (VENTOLIN HFA) 108 (90 Base) MCG/ACT inhaler INHALE 2 PUFFS INTO THE LUNGS EVERY 4 (FOUR) HOURS AS NEEDED FOR WHEEZING OR SHORTNESS OF BREATH. 18 g 0  . amLODipine (NORVASC) 10 MG tablet Take 1 tablet (10 mg total) by mouth daily. 90 tablet 3  . aspirin EC 81 MG tablet Take 1 tablet (81 mg total) by mouth daily. 30 tablet 1  . atorvastatin (LIPITOR) 20 MG tablet Take 1 tablet (20 mg total) by mouth daily. Reported on 05/01/2016 90 tablet 3  . Blood Glucose Monitoring Suppl (TRUE METRIX METER) w/Device KIT Use as instructed to check blood sugar daily. 1 kit 0  . gabapentin (NEURONTIN) 300 MG capsule TAKE 1 CAPSULE (300 MG TOTAL) BY MOUTH 3 (THREE) TIMES DAILY. 90 capsule 3  . glucose blood (TRUE METRIX BLOOD GLUCOSE TEST) test strip Use as instructed to check blood sugar daily. 100 each 12  . hydrochlorothiazide (HYDRODIURIL) 25 MG tablet Take 1 tablet (25 mg total) by mouth daily. 30 tablet 3  . ibuprofen (ADVIL) 800 MG tablet Take 1 tablet (800 mg total) by mouth every 8 (eight) hours as needed for  mild pain. 30 tablet 0  . Insulin Pen Needle 29G X 5MM MISC Use with Lantus pen 100 each 0  . metFORMIN (GLUCOPHAGE) 1000 MG tablet Take 1 tablet (1,000 mg total) by mouth 2 (two) times daily with a meal. Please sch PCP appointment. 60 tablet 0  . OLANZapine (ZYPREXA) 15 MG tablet Take 1 tablet (15 mg total) by mouth at bedtime. 30 tablet 1  . TRUEplus Lancets 28G MISC Use as instructed to check blood sugar daily. 100 each 11  . benzonatate (TESSALON PERLES)  100 MG capsule Take 1 capsule (100 mg total) by mouth 3 (three) times daily as needed for cough. (Patient not taking: Reported on 01/20/2020) 20 capsule 0  . Insulin Glargine (LANTUS SOLOSTAR) 100 UNIT/ML Solostar Pen Inject 35 Units into the skin at bedtime. (Patient not taking: Reported on 01/20/2020) 5 pen 11   No facility-administered medications prior to visit.   Review of Systems  Pos In BOLD  Constitutional:  weight loss, night sweats,  Fevers, chills, fatigue, lassitude. HEENT:   headaches,  Difficulty swallowing,  Tooth/dental problems,  Sore throat,                No sneezing, itching, ear ache, nasal congestion, post nasal drip,   CV:   chest pain,  Orthopnea, PND, swelling in lower extremities, anasarca, dizziness, palpitations  GI heartburn, indigestion, abdominal pain, nausea, vomiting, diarrhea, change in bowel habits, loss of appetite  Resp: No shortness of breath with exertion or at rest.  No excess mucus, no productive cough,  No non-productive cough,  No coughing up of blood.  No change in color of mucus.  No wheezing.  No chest wall deformity  Skin: no rash or lesions.  GU: no dysuria, change in color of urine, no urgency or frequency.  No flank pain.  MS:  No joint pain or swelling.  No decreased range of motion.  No back pain.     Psych:  No change in mood or affect. No depression or anxiety.  No memory loss.     Objective:   Physical Exam There were no vitals filed for this visit.  No exam this is a phone  visit    Assessment & Plan:  I personally reviewed all images and lab data in the [facility] system as well as any outside material available during this office visit and agree with the  radiology impressions.   COVID-19 virus infection COVID-19 viral infection and the patient qualifies for monoclonal antibody infusion and has been scheduled for January 28 which is tomorrow at 4:30 PM for an infusion.  His qualifications include that of diabetes and hypertension and his age greater than 84  Essential hypertension I will be sending refills on his antihypertensive through our pharmacy which includes the amlodipine  Uncontrolled type 2 diabetes mellitus with hyperglycemia ([place]) Uncontrolled diabetes we will refill the Metformin and the Lantus   [place] was seen today for follow-up.  Diagnoses and all orders for this visit:  COVID-19 virus infection  Essential hypertension  DM (diabetes mellitus), type 2 with neurological complications ([place])  Uncontrolled type 2 diabetes mellitus with hyperglycemia ([place])   Follow Up Instructions: The patient knows there will be a monoclonal antibody infusion scheduled for January 28   I discussed the assessment and treatment plan with the patient. The patient was provided an opportunity to ask questions and all were answered. The patient agreed with the plan and demonstrated an understanding of the instructions.   The patient was advised to call back or seek an in-person evaluation if the symptoms worsen or if the condition fails to improve as anticipated.  I provided 30 minutes of non-face-to-face time during this encounter  including  median intraservice time , review of notes, labs, imaging, medications  and explaining diagnosis and management to the patient .    Asencion Noble, MD

## 2020-01-20 NOTE — Assessment & Plan Note (Signed)
Uncontrolled diabetes we will refill the Metformin and the Lantus

## 2020-01-20 NOTE — Assessment & Plan Note (Signed)
COVID-19 viral infection and the patient qualifies for monoclonal antibody infusion and has been scheduled for January 28 which is tomorrow at 4:30 PM for an infusion.  His qualifications include that of diabetes and hypertension and his age greater than 62

## 2020-01-20 NOTE — Assessment & Plan Note (Signed)
I will be sending refills on his antihypertensive through our pharmacy which includes the amlodipine

## 2020-01-21 ENCOUNTER — Ambulatory Visit (HOSPITAL_COMMUNITY)
Admission: RE | Admit: 2020-01-21 | Discharge: 2020-01-21 | Disposition: A | Discharge status: Home / Self Care | Payer: HRSA Program | Source: Ambulatory Visit | Attending: Pulmonary Disease | Admitting: Pulmonary Disease

## 2020-01-21 DIAGNOSIS — E1165 Type 2 diabetes mellitus with hyperglycemia: Secondary | ICD-10-CM

## 2020-01-21 DIAGNOSIS — U071 COVID-19: Secondary | ICD-10-CM | POA: Diagnosis present

## 2020-01-21 DIAGNOSIS — I10 Essential (primary) hypertension: Secondary | ICD-10-CM | POA: Insufficient documentation

## 2020-01-21 MED ORDER — DIPHENHYDRAMINE HCL 50 MG/ML IJ SOLN: 50.0000 mg | Freq: Once | INTRAMUSCULAR | Status: DC | PRN

## 2020-01-21 MED ORDER — SODIUM CHLORIDE 0.9 % IV SOLN
700.0000 mg | Freq: Once | INTRAVENOUS | Status: AC
  Administered 2020-01-21: 700 mg via INTRAVENOUS
  Filled 2020-01-21: qty 20

## 2020-01-21 MED ORDER — SODIUM CHLORIDE 0.9 % IV SOLN
INTRAVENOUS | Status: DC | PRN
  Administered 2020-01-21: 250 mL via INTRAVENOUS

## 2020-01-21 MED ORDER — EPINEPHRINE 0.3 MG/0.3ML IJ SOAJ: 0.3000 mg | Freq: Once | INTRAMUSCULAR | Status: DC | PRN

## 2020-01-21 MED ORDER — FAMOTIDINE IN NACL 20-0.9 MG/50ML-% IV SOLN: 20.0000 mg | Freq: Once | INTRAVENOUS | Status: DC | PRN

## 2020-01-21 MED ORDER — METHYLPREDNISOLONE SODIUM SUCC 125 MG IJ SOLR: 125.0000 mg | Freq: Once | INTRAMUSCULAR | Status: DC | PRN

## 2020-01-21 MED ORDER — ALBUTEROL SULFATE HFA 108 (90 BASE) MCG/ACT IN AERS: 2.0000 | INHALATION_SPRAY | Freq: Once | RESPIRATORY_TRACT | Status: DC | PRN

## 2020-01-21 MED FILL — TRUEplus LANCETS 28G MISC: 25 days supply | Qty: 100 | Fill #0

## 2020-01-21 MED FILL — TRUEPLUS 5-BEVEL PEN NEEDLE: 31G X 5 MM | 25 days supply | Qty: 100 | Fill #0

## 2020-01-21 MED FILL — metFORMIN HCL 1000 MG TABS: 1000 | 30 days supply | Qty: 60 | Fill #0

## 2020-01-21 MED FILL — AMLODIPINE BESYLATE 10 MG T: 10 | 30 days supply | Qty: 30 | Fill #0

## 2020-01-21 MED FILL — OLANZapine 15 MG TABS: 15 | 30 days supply | Qty: 30 | Fill #0

## 2020-01-21 MED FILL — GABAPENTIN 300 MG CAPSULE: 300 | 30 days supply | Qty: 90 | Fill #0

## 2020-01-21 MED FILL — ?ATORVASTATIN 20 MG TABLET: 20 | 30 days supply | Qty: 30 | Fill #0

## 2020-01-21 MED FILL — TRUE METRIX GLUCOSE TEST ST: 25 days supply | Qty: 100 | Fill #0

## 2020-01-21 MED FILL — ?BASAGLAR 100 UNITS/ML KWPE: 100 | 33 days supply | Qty: 12 | Fill #0

## 2020-01-21 NOTE — Progress Notes (Signed)
  Diagnosis: COVID-19  Physician: Rocky Morel  Procedure: [facility] Med: bamlanivimab infusion - Provided patient with bamlanimivab fact sheet for patients, parents and caregivers prior to infusion.  Complications: No immediate complications noted.  Discharge: Discharged home   Kalyn Dimattia C 01/21/2020

## 2020-01-21 NOTE — Discharge Instructions (Signed)
COVID-19 COVID-19 is a respiratory infection that is caused by a virus called severe acute respiratory syndrome coronavirus 2 (SARS-CoV-2). The disease is also known as coronavirus disease or novel coronavirus. In some people, the virus may not cause any symptoms. In others, it may cause a serious infection. The infection can get worse quickly and can lead to complications, such as:  Pneumonia, or infection of the lungs.  Acute respiratory distress syndrome or ARDS. This is a condition in which fluid build-up in the lungs prevents the lungs from filling with air and passing oxygen into the blood.  Acute respiratory failure. This is a condition in which there is not enough oxygen passing from the lungs to the body or when carbon dioxide is not passing from the lungs out of the body.  Sepsis or septic shock. This is a serious bodily reaction to an infection.  Blood clotting problems.  Secondary infections due to bacteria or fungus.  Organ failure. This is when your body's organs stop working. The virus that causes COVID-19 is contagious. This means that it can spread from person to person through droplets from coughs and sneezes (respiratory secretions). What are the causes? This illness is caused by a virus. You may catch the virus by:  Breathing in droplets from an infected person. Droplets can be spread by a person breathing, speaking, singing, coughing, or sneezing.  Touching something, like a table or a doorknob, that was exposed to the virus (contaminated) and then touching your mouth, nose, or eyes. What increases the risk? Risk for infection You are more likely to be infected with this virus if you:  Are within 6 feet (2 meters) of a person with COVID-19.  Provide care for or live with a person who is infected with COVID-19.  Spend time in crowded indoor spaces or live in shared housing. Risk for serious illness You are more likely to become seriously ill from the virus if  you:  Are 50 years of age or older. The higher your age, the more you are at risk for serious illness.  Live in a nursing home or long-term care facility.  Have cancer.  Have a long-term (chronic) disease such as: ? Chronic lung disease, including chronic obstructive pulmonary disease or asthma. ? A long-term disease that lowers your body's ability to fight infection (immunocompromised). ? Heart disease, including heart failure, a condition in which the arteries that lead to the heart become narrow or blocked (coronary artery disease), a disease which makes the heart muscle thick, weak, or stiff (cardiomyopathy). ? Diabetes. ? Chronic kidney disease. ? Sickle cell disease, a condition in which red blood cells have an abnormal "sickle" shape. ? Liver disease.  Are obese. What are the signs or symptoms? Symptoms of this condition can range from mild to severe. Symptoms may appear any time from 2 to 14 days after being exposed to the virus. They include:  A fever or chills.  A cough.  Difficulty breathing.  Headaches, body aches, or muscle aches.  Runny or stuffy (congested) nose.  A sore throat.  New loss of taste or smell. Some people may also have stomach problems, such as nausea, vomiting, or diarrhea. Other people may not have any symptoms of COVID-19. How is this diagnosed? This condition may be diagnosed based on:  Your signs and symptoms, especially if: ? You live in an area with a COVID-19 outbreak. ? You recently traveled to or from an area where the virus is common. ? You   provide care for or live with a person who was diagnosed with COVID-19. ? You were exposed to a person who was diagnosed with COVID-19.  A physical exam.  Lab tests, which may include: ? Taking a sample of fluid from the back of your nose and throat (nasopharyngeal fluid), your nose, or your throat using a swab. ? A sample of mucus from your lungs (sputum). ? Blood tests.  Imaging tests,  which may include, X-rays, CT scan, or ultrasound. How is this treated? At present, there is no medicine to treat COVID-19. Medicines that treat other diseases are being used on a trial basis to see if they are effective against COVID-19. Your health care provider will talk with you about ways to treat your symptoms. For most people, the infection is mild and can be managed at home with rest, fluids, and over-the-counter medicines. Treatment for a serious infection usually takes places in a hospital intensive care unit (ICU). It may include one or more of the following treatments. These treatments are given until your symptoms improve.  Receiving fluids and medicines through an IV.  Supplemental oxygen. Extra oxygen is given through a tube in the nose, a face mask, or a hood.  Positioning you to lie on your stomach (prone position). This makes it easier for oxygen to get into the lungs.  Continuous positive airway pressure (CPAP) or bi-level positive airway pressure (BPAP) machine. This treatment uses mild air pressure to keep the airways open. A tube that is connected to a motor delivers oxygen to the body.  Ventilator. This treatment moves air into and out of the lungs by using a tube that is placed in your windpipe.  Tracheostomy. This is a procedure to create a hole in the neck so that a breathing tube can be inserted.  Extracorporeal membrane oxygenation (ECMO). This procedure gives the lungs a chance to recover by taking over the functions of the heart and lungs. It supplies oxygen to the body and removes carbon dioxide. Follow these instructions at home: Lifestyle  If you are sick, stay home except to get medical care. Your health care provider will tell you how long to stay home. Call your health care provider before you go for medical care.  Rest at home as told by your health care provider.  Do not use any products that contain nicotine or tobacco, such as cigarettes,  e-cigarettes, and chewing tobacco. If you need help quitting, ask your health care provider.  Return to your normal activities as told by your health care provider. Ask your health care provider what activities are safe for you. General instructions  Take over-the-counter and prescription medicines only as told by your health care provider.  Drink enough fluid to keep your urine pale yellow.  Keep all follow-up visits as told by your health care provider. This is important. How is this prevented?  There is no vaccine to help prevent COVID-19 infection. However, there are steps you can take to protect yourself and others from this virus. To protect yourself:   Do not travel to areas where COVID-19 is a risk. The areas where COVID-19 is reported change often. To identify high-risk areas and travel restrictions, check the CDC travel website: wwwnc.cdc.gov/travel/notices  If you live in, or must travel to, an area where COVID-19 is a risk, take precautions to avoid infection. ? Stay away from people who are sick. ? Wash your hands often with soap and water for 20 seconds. If soap and water   are not available, use an alcohol-based hand sanitizer. ? Avoid touching your mouth, face, eyes, or nose. ? Avoid going out in public, follow guidance from your state and local health authorities. ? If you must go out in public, wear a cloth face covering or face mask. Make sure your mask covers your nose and mouth. ? Avoid crowded indoor spaces. Stay at least 6 feet (2 meters) away from others. ? Disinfect objects and surfaces that are frequently touched every day. This may include:  Counters and tables.  Doorknobs and light switches.  Sinks and faucets.  Electronics, such as phones, remote controls, keyboards, computers, and tablets. To protect others: If you have symptoms of COVID-19, take steps to prevent the virus from spreading to others.  If you think you have a COVID-19 infection, contact  your health care provider right away. Tell your health care team that you think you may have a COVID-19 infection.  Stay home. Leave your house only to seek medical care. Do not use public transport.  Do not travel while you are sick.  Wash your hands often with soap and water for 20 seconds. If soap and water are not available, use alcohol-based hand sanitizer.  Stay away from other members of your household. Let healthy household members care for children and pets, if possible. If you have to care for children or pets, wash your hands often and wear a mask. If possible, stay in your own room, separate from others. Use a different bathroom.  Make sure that all people in your household wash their hands well and often.  Cough or sneeze into a tissue or your sleeve or elbow. Do not cough or sneeze into your hand or into the air.  Wear a cloth face covering or face mask. Make sure your mask covers your nose and mouth. Where to find more information  Centers for Disease Control and Prevention: www.cdc.gov/coronavirus/2019-ncov/index.html  World Health Organization: www.who.int/health-topics/coronavirus Contact a health care provider if:  You live in or have traveled to an area where COVID-19 is a risk and you have symptoms of the infection.  You have had contact with someone who has COVID-19 and you have symptoms of the infection. Get help right away if:  You have trouble breathing.  You have pain or pressure in your chest.  You have confusion.  You have bluish lips and fingernails.  You have difficulty waking from sleep.  You have symptoms that get worse. These symptoms may represent a serious problem that is an emergency. Do not wait to see if the symptoms will go away. Get medical help right away. Call your local emergency services (911 in the [place] Do not drive yourself to the hospital. Let the emergency medical personnel know if you think you have  COVID-19. Summary  COVID-19 is a respiratory infection that is caused by a virus. It is also known as coronavirus disease or novel coronavirus. It can cause serious infections, such as pneumonia, acute respiratory distress syndrome, acute respiratory failure, or sepsis.  The virus that causes COVID-19 is contagious. This means that it can spread from person to person through droplets from breathing, speaking, singing, coughing, or sneezing.  You are more likely to develop a serious illness if you are 50 years of age or older, have a weak immune system, live in a nursing home, or have chronic disease.  There is no medicine to treat COVID-19. Your health care provider will talk with you about ways to treat your symptoms.    Take steps to protect yourself and others from infection. Wash your hands often and disinfect objects and surfaces that are frequently touched every day. Stay away from people who are sick and wear a mask if you are sick. This information is not intended to replace advice given to you by your health care provider. Make sure you discuss any questions you have with your health care provider. Document Revised: 10/09/2019 Document Reviewed: 01/15/2019 Elsevier Patient Education  2020 Elsevier Inc. What types of side effects do monoclonal antibody drugs cause?  Common side effects  In general, the more common side effects caused by monoclonal antibody drugs include: . Allergic reactions, such as hives or itching . Flu-like signs and symptoms, including chills, fatigue, fever, and muscle aches and pains . Nausea, vomiting . Diarrhea . Skin rashes . Low blood pressure   The CDC is recommending patients who receive monoclonal antibody treatments wait at least 90 days before being vaccinated.  Currently, there are no data on the safety and efficacy of mRNA COVID-19 vaccines in persons who received monoclonal antibodies or convalescent plasma as part of COVID-19 treatment. Based  on the estimated half-life of such therapies as well as evidence suggesting that reinfection is uncommon in the 90 days after initial infection, vaccination should be deferred for at least 90 days, as a precautionary measure until additional information becomes available, to avoid interference of the antibody treatment with vaccine-induced immune responses. 

## 2020-01-28 ENCOUNTER — Telehealth: Payer: Self-pay | Admitting: Critical Care Medicine

## 2020-01-28 NOTE — Telephone Encounter (Signed)
Can we ask the pharmacy to see if they can work with this patient or do we have another avenue?  I could send meds to [facility] and put on congregational nurse fund.

## 2020-01-28 NOTE — Telephone Encounter (Signed)
The pt meds are $38 and he stated he cant afford them. He wanted you to know.

## 2020-02-01 ENCOUNTER — Other Ambulatory Visit: Payer: Self-pay | Admitting: Critical Care Medicine

## 2020-02-01 ENCOUNTER — Telehealth: Payer: Self-pay

## 2020-02-01 MED FILL — !VENTOLIN HFA INHALER: 108 (90 BAS | 16 days supply | Qty: 18 | Fill #0

## 2020-02-01 MED FILL — HYDROCHLOROTHIAZIDE 25 MG T: 25 | 30 days supply | Qty: 30 | Fill #2

## 2020-02-01 NOTE — Telephone Encounter (Signed)
Message received from [facility], RN/[facility] noting that she does not assist with [facility] card applications he would need to check with his case worker.  Call placed to [facility], CM/[facility].  She explained that he "abandoned"   his room at the motel and they don't know where he is.   Attempted to contact the patient # [phone], message left with call back requested to this CM

## 2020-02-02 NOTE — Telephone Encounter (Signed)
I tried to contact this patient and was not able to reach the patient at this time

## 2020-02-08 MED ORDER — LANTUS SOLOSTAR 100 UNIT/ML ~~LOC~~ SOPN: 40.0000 [IU] | PEN_INJECTOR | Freq: Every day | SUBCUTANEOUS | 11 refills | Status: DC

## 2020-02-08 NOTE — Telephone Encounter (Signed)
I connected with this patient by phone today and he now is living in [place].  Note he did receive the monoclonal antibody infusion on January 28 and did well with this and all of his Covid symptoms have resolved.  He does have diabetes and hypertension.  He states his Medicaid is coming through for him.  He is on Lantus 35 units nightly and Metformin 1000 mg twice daily and he states his blood sugars are 130 fasting can get as high as 240 in the afternoon.  He does not check his blood pressure but is on the amlodipine and hydrochlorthiazide.  He has had refills on all his medications.  My plan is to have this patient connect with me with another telephone visit on February 23  I also told the patient to increase his Lantus to 40 units nightly

## 2020-02-09 NOTE — Telephone Encounter (Signed)
Good Morning, I have made the appointment do I need to call and remind him?

## 2020-02-09 NOTE — Telephone Encounter (Signed)
Called pt. Pt is aware of the appointment asked that both numbers be called.

## 2020-02-09 NOTE — Telephone Encounter (Signed)
Yes, please call to remind the patient

## 2020-02-10 ENCOUNTER — Telehealth: Payer: Self-pay

## 2020-02-10 NOTE — Telephone Encounter (Signed)
Attempted to contact the patient [phone]   to remind him that he needs to re-apply for the [facility] Card in order to obtain his medications from [facility] for little to no charge. Message left with call back requested to this CM

## 2020-02-16 ENCOUNTER — Ambulatory Visit: Payer: Medicaid Other | Attending: Critical Care Medicine | Admitting: Critical Care Medicine

## 2020-02-16 ENCOUNTER — Encounter: Payer: Self-pay | Admitting: Critical Care Medicine

## 2020-02-16 ENCOUNTER — Other Ambulatory Visit: Payer: Self-pay

## 2020-02-16 DIAGNOSIS — E1165 Type 2 diabetes mellitus with hyperglycemia: Secondary | ICD-10-CM

## 2020-02-16 DIAGNOSIS — F200 Paranoid schizophrenia: Secondary | ICD-10-CM | POA: Diagnosis not present

## 2020-02-16 DIAGNOSIS — I10 Essential (primary) hypertension: Secondary | ICD-10-CM

## 2020-02-16 DIAGNOSIS — U071 COVID-19: Secondary | ICD-10-CM

## 2020-02-16 MED ORDER — AMLODIPINE BESYLATE 10 MG PO TABS: 10.0000 mg | ORAL_TABLET | Freq: Every day | ORAL | 3 refills | Status: DC

## 2020-02-16 MED ORDER — GABAPENTIN 300 MG PO CAPS: 600.0000 mg | ORAL_CAPSULE | Freq: Three times a day (TID) | ORAL | 1 refills | Status: DC

## 2020-02-16 MED ORDER — OLANZAPINE 15 MG PO TABS: 15.0000 mg | ORAL_TABLET | Freq: Every day | ORAL | 1 refills | Status: DC

## 2020-02-16 MED ORDER — METFORMIN HCL 1000 MG PO TABS: 1000.0000 mg | ORAL_TABLET | Freq: Two times a day (BID) | ORAL | 1 refills | Status: DC

## 2020-02-16 MED ORDER — ATORVASTATIN CALCIUM 20 MG PO TABS: 20.0000 mg | ORAL_TABLET | Freq: Every day | ORAL | 3 refills | Status: DC

## 2020-02-16 MED ORDER — INSULIN PEN NEEDLE 29G X 5MM MISC: 0 refills | Status: DC

## 2020-02-16 MED ORDER — LANTUS SOLOSTAR 100 UNIT/ML ~~LOC~~ SOPN: 40.0000 [IU] | PEN_INJECTOR | Freq: Every day | SUBCUTANEOUS | 11 refills | Status: DC

## 2020-02-16 MED ORDER — ASPIRIN EC 81 MG PO TBEC: 81.0000 mg | DELAYED_RELEASE_TABLET | Freq: Every day | ORAL | 1 refills | Status: DC

## 2020-02-16 MED ORDER — HYDROCHLOROTHIAZIDE 25 MG PO TABS: 25.0000 mg | ORAL_TABLET | Freq: Every day | ORAL | 3 refills | Status: DC

## 2020-02-16 NOTE — Assessment & Plan Note (Signed)
Hypertension under apparent control we will continue amlodipine 10 mg daily

## 2020-02-16 NOTE — Assessment & Plan Note (Signed)
Chronic paranoid schizophrenia controlled on Zyprexa will continue 15 mg daily at bedtime

## 2020-02-16 NOTE — Progress Notes (Signed)
Subjective:    Patient ID: [name], male    DOB: 08/19/1961, 59 y.o.   MRN: [id number] Virtual Visit via Telephone Note  I connected with [name] on 02/16/20 at  9:30 AM EST by telephone and verified that I am speaking with the correct person using two identifiers.   Consent:  I discussed the limitations, risks, security and privacy concerns of performing an evaluation and management service by telephone and the availability of in person appointments. I also discussed with the patient that there may be a patient responsible charge related to this service. The patient expressed understanding and agreed to proceed.  Location of patient: The patient was at home  Location of provider: I was on the call my office  Persons participating in the televisit with the patient.   The patient was by himself  History of Present Illness:  This is a 59 year old male seen recently in the [place] [facility] and was released from prison recently has history of hypertension type 2 diabetes and bipolar disorder.  The patient has had now given access to Lantus and was taking 35 units at bedtime patient also maintains atorvastatin, hydrochlorthiazide,Metformin, and amlodipine.  The patient notes occasional burning in the feet.  He had been on gabapentin previously but is not on gabapentin at this time.  I did resume Zyprexa on this patient 15 mg at bedtime and he has an upcoming appointment with mental health at [facility].  Glucoses have been in the 170 to as high as 300 after meals.  Denies any chest pain or dyspnea.  01/20/2020 This is a telehealth phone visit.  This patient formally was in the [place] but now has moved in with his daughter in [place].  He became ill on January 22 and was tested the same day and was Covid positive in [facility].  The patient notes wheezing cough lightheadedness and significant fatigue at this time.  He did have also some diarrhea  which is slowly resolving but still present.  The patient is also lost his sense of smell.  He also has headaches as well.  Patient denies significant shortness of breath.  He is currently on his fifth day of illness and he is a candidate for monoclonal antibody infusion secondary to his diabetes and hypertension and age  He does not monitor his blood sugar and he is run out of his Lantus  02/16/2020 This is a follow-up telephone visit from a January phone visit.  This patient has history of type 2 diabetes with diabetic neuropathy ongoing tobacco use and hypertension.  Note in January he did develop Covid infection and did receive monoclonal antibody therapy on January 28.  He states he did very well with this.  He states he has had no other symptoms from the Covid at this time.  Patient states his blood sugars have been 130-140 before meals.  He denies any alcohol use.  He now has full Medicaid.  He is in need refills on his medications.  He would like to have his meds to go to a pharmacy in [place] where he is now living.  He currently is still not have any employment.  He still complaining of significant neuropathy type pain and he awakens it night with this   Past Medical History:  Diagnosis Date  . COVID-19 virus infection 01/20/2020  . Diabetes mellitus   . Diabetic neuropathy ([place])   . High cholesterol   . Hypertension   .  Schizophrenia ([place])      Family History  Problem Relation Age of Onset  . Hypertension Mother   . Cancer Father   . Alcoholism Other      Social History   Socioeconomic History  . Marital status: Single    Spouse name: Not on file  . Number of children: Not on file  . Years of education: Not on file  . Highest education level: Not on file  Occupational History  . Not on file  Tobacco Use  . Smoking status: Current Every Day Smoker    Packs/day: 0.50    Types: Cigarettes  . Smokeless tobacco: Never Used  Substance and Sexual Activity  . Alcohol use:  No  . Drug use: No  . Sexual activity: Yes  Other Topics Concern  . Not on file  Social History Narrative  . Not on file   Social Determinants of Health   Financial Resource Strain:   . Difficulty of Paying Living Expenses: Not on file  Food Insecurity:   . Worried About Charity fundraiser in the Last Year: Not on file  . Ran Out of Food in the Last Year: Not on file  Transportation Needs:   . Lack of Transportation (Medical): Not on file  . Lack of Transportation (Non-Medical): Not on file  Physical Activity:   . Days of Exercise per Week: Not on file  . Minutes of Exercise per Session: Not on file  Stress:   . Feeling of Stress : Not on file  Social Connections:   . Frequency of Communication with Friends and Family: Not on file  . Frequency of Social Gatherings with Friends and Family: Not on file  . Attends Religious Services: Not on file  . Active Member of Clubs or Organizations: Not on file  . Attends Archivist Meetings: Not on file  . Marital Status: Not on file  Intimate Partner Violence:   . Fear of Current or Ex-Partner: Not on file  . Emotionally Abused: Not on file  . Physically Abused: Not on file  . Sexually Abused: Not on file     Allergies  Allergen Reactions  . Benadryl [Diphenhydramine Hcl]   . Diphenhydramine     GI upset, paradoxic agitation  . Haldol [Haloperidol Lactate]   . Haldol [Haloperidol]     "makes me tremble, bite my tongue"  . Lisinopril Swelling  . Thorazine [Chlorpromazine]     Tremble, bite my tongue  . Thorazine [Chlorpromazine]      Outpatient Medications Prior to Visit  Medication Sig Dispense Refill  . Blood Glucose Monitoring Suppl (TRUE METRIX METER) w/Device KIT Use as instructed to check blood sugar daily. 1 kit 0  . glucose blood (TRUE METRIX BLOOD GLUCOSE TEST) test strip Use as instructed to check blood sugar daily. 100 each 12  . ibuprofen (ADVIL) 800 MG tablet Take 1 tablet (800 mg total) by mouth  every 8 (eight) hours as needed for mild pain. 30 tablet 0  . TRUEplus Lancets 28G MISC Use as instructed to check blood sugar daily. 100 each 11  . VENTOLIN HFA 108 (90 Base) MCG/ACT inhaler INHALE 2 PUFFS INTO THE LUNGS EVERY 4 (FOUR) HOURS AS NEEDED FOR WHEEZING OR SHORTNESS OF BREATH. 18 g 2  . amLODipine (NORVASC) 10 MG tablet Take 1 tablet (10 mg total) by mouth daily. 90 tablet 3  . atorvastatin (LIPITOR) 20 MG tablet Take 1 tablet (20 mg total) by mouth daily. Reported on  05/01/2016 90 tablet 3  . gabapentin (NEURONTIN) 300 MG capsule Take 1 capsule (300 mg total) by mouth 3 (three) times daily. 90 capsule 3  . hydrochlorothiazide (HYDRODIURIL) 25 MG tablet Take 1 tablet (25 mg total) by mouth daily. 30 tablet 3  . Insulin Glargine (LANTUS SOLOSTAR) 100 UNIT/ML Solostar Pen Inject 40 Units into the skin at bedtime. 5 pen 11  . Insulin Pen Needle 29G X 5MM MISC Use with Lantus pen 100 each 0  . metFORMIN (GLUCOPHAGE) 1000 MG tablet Take 1 tablet (1,000 mg total) by mouth 2 (two) times daily with a meal. Please sch PCP appointment. 60 tablet 0  . OLANZapine (ZYPREXA) 15 MG tablet Take 1 tablet (15 mg total) by mouth at bedtime. 30 tablet 1  . aspirin EC 81 MG tablet Take 1 tablet (81 mg total) by mouth daily. (Patient not taking: Reported on 02/16/2020) 30 tablet 1   No facility-administered medications prior to visit.   Review of Systems  Pos In BOLD  Constitutional:  weight loss, night sweats,  Fevers, chills, fatigue, lassitude. HEENT:   headaches,  Difficulty swallowing,  Tooth/dental problems,  Sore throat,                No sneezing, itching, ear ache, nasal congestion, post nasal drip,   CV:   chest pain,  Orthopnea, PND, swelling in lower extremities, anasarca, dizziness, palpitations  GI heartburn, indigestion, abdominal pain, nausea, vomiting, diarrhea, change in bowel habits, loss of appetite  Resp: No shortness of breath with exertion or at rest.  No excess mucus, no  productive cough,  No non-productive cough,  No coughing up of blood.  No change in color of mucus.  No wheezing.  No chest wall deformity  Skin: no rash or lesions.  GU: no dysuria, change in color of urine, no urgency or frequency.  No flank pain.  MS:  No joint pain or swelling.  No decreased range of motion.  No back pain.   Pain in both lower legs neuropathic  Psych:  No change in mood or affect. No depression or anxiety.  No memory loss.     Objective:   Physical Exam There were no vitals filed for this visit.  No exam this is a phone visit    Assessment & Plan:  I personally reviewed all images and lab data in the [facility] system as well as any outside material available during this office visit and agree with the  radiology impressions.   Essential hypertension Hypertension under apparent control we will continue amlodipine 10 mg daily  Uncontrolled type 2 diabetes mellitus with hyperglycemia ([place]) Uncontrolled diabetes with hyperglycemia with improving status  Plan will be to refill the Lantus and metformin  Chronic paranoid schizophrenia (HCC) Chronic paranoid schizophrenia controlled on Zyprexa will continue 15 mg daily at bedtime  COVID-19 virus infection Covid viral infection completely resolved status post monoclonal antibody therapy   Rushawn was seen today for follow-up.  Diagnoses and all orders for this visit:  Essential hypertension  Uncontrolled type 2 diabetes mellitus with hyperglycemia ([place])  Chronic paranoid schizophrenia ([place])  COVID-19 virus infection  Other orders -     amLODipine (NORVASC) 10 MG tablet; Take 1 tablet (10 mg total) by mouth daily. -     aspirin EC 81 MG tablet; Take 1 tablet (81 mg total) by mouth daily. -     atorvastatin (LIPITOR) 20 MG tablet; Take 1 tablet (20 mg total) by mouth daily. Reported on  05/01/2016 -     gabapentin (NEURONTIN) 300 MG capsule; Take 2 capsules (600 mg total) by mouth 3 (three) times daily. -      hydrochlorothiazide (HYDRODIURIL) 25 MG tablet; Take 1 tablet (25 mg total) by mouth daily. -     Insulin Glargine (LANTUS SOLOSTAR) 100 UNIT/ML Solostar Pen; Inject 40 Units into the skin at bedtime. -     Insulin Pen Needle 29G X 5MM MISC; Use with Lantus pen -     metFORMIN (GLUCOPHAGE) 1000 MG tablet; Take 1 tablet (1,000 mg total) by mouth 2 (two) times daily with a meal. -     OLANZapine (ZYPREXA) 15 MG tablet; Take 1 tablet (15 mg total) by mouth at bedtime.    Follow Up Instructions: The patient knows his meds were sent to his pharmacy in [place] and a follow-up exam will be in office in the next month  I discussed the assessment and treatment plan with the patient. The patient was provided an opportunity to ask questions and all were answered. The patient agreed with the plan and demonstrated an understanding of the instructions.   The patient was advised to call back or seek an in-person evaluation if the symptoms worsen or if the condition fails to improve as anticipated.  I provided 30 minutes of non-face-to-face time during this encounter  including  median intraservice time , review of notes, labs, imaging, medications  and explaining diagnosis and management to the patient .    Asencion Noble, MD

## 2020-02-16 NOTE — Assessment & Plan Note (Signed)
Covid viral infection completely resolved status post monoclonal antibody therapy

## 2020-02-16 NOTE — Assessment & Plan Note (Signed)
Uncontrolled diabetes with hyperglycemia with improving status  Plan will be to refill the Lantus and metformin

## 2020-02-16 NOTE — Progress Notes (Signed)
Patient verified DOB Patient has taken medication today. Patient has not eaten today. Patient complains of intermittent back pain increasing at night. Patient is requesting a increase gabapentin or an alternative. CBG 4 days ago was 130-140.

## 2020-05-03 ENCOUNTER — Other Ambulatory Visit: Payer: Self-pay | Admitting: Critical Care Medicine

## 2020-05-06 ENCOUNTER — Telehealth: Payer: Self-pay | Admitting: Critical Care Medicine

## 2020-05-06 MED ORDER — ALBUTEROL SULFATE HFA 108 (90 BASE) MCG/ACT IN AERS: INHALATION_SPRAY | RESPIRATORY_TRACT | 2 refills | Status: DC

## 2020-05-06 NOTE — Telephone Encounter (Signed)
Patient called in and requested for a refill on listed medication VENTOLIN HFA 108 (90 Base) MCG/ACT inhaler [[id number]]  [facility] - [place], [place] - [address], [place] [address]   Patient stated that he went to [facility] hospital and that he was informed that he had a pinched nerve in his neck. They prescribed him Methocarbamol 500mg  tablets and ibuprofen 600 mg. Patient stated that they are not working, he would like something else to help with the pain.

## 2020-05-06 NOTE — Telephone Encounter (Signed)
Inhaler refill was sent     He needs a video visit next week to discuss the neck pain

## 2020-05-09 NOTE — Telephone Encounter (Signed)
Call placed to patient, no answer, lvm to return call.

## 2020-05-10 ENCOUNTER — Ambulatory Visit: Payer: Medicaid Other | Attending: Critical Care Medicine | Admitting: Critical Care Medicine

## 2020-05-10 ENCOUNTER — Encounter: Payer: Self-pay | Admitting: Critical Care Medicine

## 2020-05-10 ENCOUNTER — Other Ambulatory Visit: Payer: Self-pay

## 2020-05-10 DIAGNOSIS — I10 Essential (primary) hypertension: Secondary | ICD-10-CM | POA: Diagnosis not present

## 2020-05-10 DIAGNOSIS — E1149 Type 2 diabetes mellitus with other diabetic neurological complication: Secondary | ICD-10-CM

## 2020-05-10 MED ORDER — GABAPENTIN 300 MG PO CAPS: 600.0000 mg | ORAL_CAPSULE | Freq: Three times a day (TID) | ORAL | 1 refills | Status: DC

## 2020-05-10 MED ORDER — HYDROCHLOROTHIAZIDE 25 MG PO TABS: 25.0000 mg | ORAL_TABLET | Freq: Every day | ORAL | 3 refills | Status: DC

## 2020-05-10 MED ORDER — LANTUS SOLOSTAR 100 UNIT/ML ~~LOC~~ SOPN: 40.0000 [IU] | PEN_INJECTOR | Freq: Every day | SUBCUTANEOUS | 11 refills | Status: DC

## 2020-05-10 MED ORDER — METFORMIN HCL 1000 MG PO TABS: 1000.0000 mg | ORAL_TABLET | Freq: Two times a day (BID) | ORAL | 1 refills | Status: DC

## 2020-05-10 NOTE — Progress Notes (Addendum)
Subjective:    Patient ID: [name], male    DOB: 24-May-1961, 58 y.o.   MRN: [id number] Virtual Visit via Telephone Note  I connected with [name] on 06/06/20 at  2:00 PM EDT by telephone and verified that I am speaking with the correct person using two identifiers.   Consent:  I discussed the limitations, risks, security and privacy concerns of performing an evaluation and management service by telephone and the availability of in person appointments. I also discussed with the patient that there may be a patient responsible charge related to this service. The patient expressed understanding and agreed to proceed.  Location of patient: The patient was at home  Location of provider: I was on the call my office  Persons participating in the televisit with the patient.   The patient was by himself  History of Present Illness:  This is a 59 year old male seen recently in the [place] [facility] and was released from prison recently has history of hypertension type 2 diabetes and bipolar disorder.  The patient has had now given access to Lantus and was taking 35 units at bedtime patient also maintains atorvastatin, hydrochlorthiazide,Metformin, and amlodipine.  The patient notes occasional burning in the feet.  He had been on gabapentin previously but is not on gabapentin at this time.  I did resume Zyprexa on this patient 15 mg at bedtime and he has an upcoming appointment with [facility] health at [facility].  Glucoses have been in the 170 to as high as 300 after meals.  Denies any chest pain or dyspnea.  01/20/2020 This is a telehealth phone visit.  This patient formally was in the [place] but now has moved in with his daughter in [place].  He became ill on January 22 and was tested the same day and was Covid positive in [facility].  The patient notes wheezing cough lightheadedness and significant fatigue at this time.  He did have also some diarrhea  which is slowly resolving but still present.  The patient is also lost his sense of smell.  He also has headaches as well.  Patient denies significant shortness of breath.  He is currently on his fifth day of illness and he is a candidate for monoclonal antibody infusion secondary to his diabetes and hypertension and age  He does not monitor his blood sugar and he is run out of his Lantus  02/16/2020 This is a follow-up telephone visit from a January phone visit.  This patient has history of type 2 diabetes with diabetic neuropathy ongoing tobacco use and hypertension.  Note in January he did develop Covid infection and did receive monoclonal antibody therapy on January 28.  He states he did very well with this.  He states he has had no other symptoms from the Covid at this time.  Patient states his blood sugars have been 130-140 before meals.  He denies any alcohol use.  He now has full Medicaid.  He is in need refills on his medications.  He would like to have his meds to go to a pharmacy in [place] where he is now living.  He currently is still not have any employment.  He still complaining of significant neuropathy type pain and he awakens it night with this  05/10/2020 Patient seen by way of a telephone visit and is doing well overall blood sugars in the 130s fasting he is not drinking his smoking is down to half a pack a day he  does not monitor his blood pressure at home.  He does complain of chronic neuropathy.  Past Medical History:  Diagnosis Date  . COVID-19 virus infection 01/20/2020  . Diabetes mellitus   . Diabetic neuropathy ([place])   . High cholesterol   . Hypertension   . Schizophrenia ([place])      Family History  Problem Relation Age of Onset  . Hypertension Mother   . Cancer Father   . Alcoholism Other      Social History   Socioeconomic History  . Marital status: Single    Spouse name: Not on file  . Number of children: Not on file  . Years of education: Not on file    . Highest education level: Not on file  Occupational History  . Not on file  Tobacco Use  . Smoking status: Current Every Day Smoker    Packs/day: 0.50    Types: Cigarettes  . Smokeless tobacco: Never Used  Vaping Use  . Vaping Use: Never used  Substance and Sexual Activity  . Alcohol use: No  . Drug use: No  . Sexual activity: Yes  Other Topics Concern  . Not on file  Social History Narrative  . Not on file   Social Determinants of Health   Financial Resource Strain:   . Difficulty of Paying Living Expenses:   Food Insecurity:   . Worried About Charity fundraiser in the Last Year:   . Arboriculturist in the Last Year:   Transportation Needs:   . Film/video editor (Medical):   Marland Kitchen Lack of Transportation (Non-Medical):   Physical Activity:   . Days of Exercise per Week:   . Minutes of Exercise per Session:   Stress:   . Feeling of Stress :   Social Connections:   . Frequency of Communication with Friends and Family:   . Frequency of Social Gatherings with Friends and Family:   . Attends Religious Services:   . Active Member of Clubs or Organizations:   . Attends Archivist Meetings:   Marland Kitchen Marital Status:   Intimate Partner Violence:   . Fear of Current or Ex-Partner:   . Emotionally Abused:   Marland Kitchen Physically Abused:   . Sexually Abused:      Allergies  Allergen Reactions  . Benadryl [Diphenhydramine Hcl]   . Diphenhydramine     GI upset, paradoxic agitation  . Haldol [Haloperidol Lactate]   . Haldol [Haloperidol]     "makes me tremble, bite my tongue"  . Lisinopril Swelling  . Thorazine [Chlorpromazine]     Tremble, bite my tongue  . Thorazine [Chlorpromazine]      Outpatient Medications Prior to Visit  Medication Sig Dispense Refill  . albuterol (VENTOLIN HFA) 108 (90 Base) MCG/ACT inhaler INHALE 2 PUFFS INTO THE LUNGS EVERY 4 (FOUR) HOURS AS NEEDED FOR WHEEZING OR SHORTNESS OF BREATH. 18 g 2  . amLODipine (NORVASC) 10 MG tablet Take 1  tablet (10 mg total) by mouth daily. 90 tablet 3  . aspirin EC 81 MG tablet Take 1 tablet (81 mg total) by mouth daily. 30 tablet 1  . atorvastatin (LIPITOR) 20 MG tablet Take 1 tablet (20 mg total) by mouth daily. Reported on 05/01/2016 90 tablet 3  . Blood Glucose Monitoring Suppl (TRUE METRIX METER) w/Device KIT Use as instructed to check blood sugar daily. 1 kit 0  . glucose blood (TRUE METRIX BLOOD GLUCOSE TEST) test strip Use as instructed to check blood sugar  daily. 100 each 12  . ibuprofen (ADVIL) 800 MG tablet Take 1 tablet (800 mg total) by mouth every 8 (eight) hours as needed for mild pain. 30 tablet 0  . Insulin Pen Needle 29G X 5MM MISC Use with Lantus pen 100 each 0  . OLANZapine (ZYPREXA) 15 MG tablet TAKE ONE TABLET BY MOUTH AT BEDTIME. 30 tablet 1  . TRUEplus Lancets 28G MISC Use as instructed to check blood sugar daily. 100 each 11  . hydrochlorothiazide (HYDRODIURIL) 25 MG tablet Take 1 tablet (25 mg total) by mouth daily. 30 tablet 3  . Insulin Glargine (LANTUS SOLOSTAR) 100 UNIT/ML Solostar Pen Inject 40 Units into the skin at bedtime. 5 pen 11  . metFORMIN (GLUCOPHAGE) 1000 MG tablet Take 1 tablet (1,000 mg total) by mouth 2 (two) times daily with a meal. 180 tablet 1  . gabapentin (NEURONTIN) 300 MG capsule Take 2 capsules (600 mg total) by mouth 3 (three) times daily. 180 capsule 1   No facility-administered medications prior to visit.   Review of Systems   Pos In BOLD  Constitutional:  weight loss, night sweats,  Fevers, chills, fatigue, lassitude. HEENT:   headaches,  Difficulty swallowing,  Tooth/dental problems,  Sore throat,                No sneezing, itching, ear ache, nasal congestion, post nasal drip,   CV:   chest pain,  Orthopnea, PND, swelling in lower extremities, anasarca, dizziness, palpitations  GI heartburn, indigestion, abdominal pain, nausea, vomiting, diarrhea, change in bowel habits, loss of appetite  Resp: No shortness of breath with exertion  or at rest.  No excess mucus, no productive cough,  No non-productive cough,  No coughing up of blood.  No change in color of mucus.  No wheezing.  No chest wall deformity  Skin: no rash or lesions.  GU: no dysuria, change in color of urine, no urgency or frequency.  No flank pain.  MS:  No joint pain or swelling.  No decreased range of motion.  No back pain.   Pain in both lower legs neuropathic  Psych:  No change in mood or affect. No depression or anxiety.  No memory loss.     Objective:   Physical Exam  There were no vitals filed for this visit.  No exam this is a phone visit    Assessment & Plan:  I personally reviewed all images and lab data in the [facility] system as well as any outside material available during this office visit and agree with the  radiology impressions.   Essential hypertension Continue current treatment plan have patient come to the office for exam  DM (diabetes mellitus), type 2 with neurological complications ([place]) No change in medications have patient come to the office for exam   Diagnoses and all orders for this visit:  Essential hypertension  DM (diabetes mellitus), type 2 with neurological complications ([place])  Other orders -     gabapentin (NEURONTIN) 300 MG capsule; Take 2 capsules (600 mg total) by mouth 3 (three) times daily. -     hydrochlorothiazide (HYDRODIURIL) 25 MG tablet; Take 1 tablet (25 mg total) by mouth daily. -     insulin glargine (LANTUS SOLOSTAR) 100 UNIT/ML Solostar Pen; Inject 40 Units into the skin at bedtime. -     metFORMIN (GLUCOPHAGE) 1000 MG tablet; Take 1 tablet (1,000 mg total) by mouth 2 (two) times daily with a meal.    Follow Up Instructions: The patient  knows his meds were sent to his pharmacy in [place] and a follow-up exam will be in office in the next month  I discussed the assessment and treatment plan with the patient. The patient was provided an opportunity to ask questions and all were answered. The  patient agreed with the plan and demonstrated an understanding of the instructions.   The patient was advised to call back or seek an in-person evaluation if the symptoms worsen or if the condition fails to improve as anticipated.  I provided 15 minutes of non-face-to-face time during this encounter  including  median intraservice time , review of notes, labs, imaging, medications  and explaining diagnosis and management to the patient .    Asencion Noble, MD

## 2020-05-10 NOTE — Assessment & Plan Note (Signed)
No change in medications have patient come to the office for exam

## 2020-05-10 NOTE — Assessment & Plan Note (Signed)
Continue current treatment plan have patient come to the office for exam

## 2020-06-06 NOTE — Progress Notes (Signed)
Subjective:    Patient ID: [name], male    DOB: Oct 05, 1961, 59 y.o.   MRN: [id number]  History of Present Illness:  This is a 59 year old male seen recently in the [place] clinic and was released from prison recently has history of hypertension type 2 diabetes and bipolar disorder.  The patient has had now given access to Lantus and was taking 35 units at bedtime patient also maintains atorvastatin, hydrochlorthiazide,Metformin, and amlodipine.  The patient notes occasional burning in the feet.  He had been on gabapentin previously but is not on gabapentin at this time.  I did resume Zyprexa on this patient 15 mg at bedtime and he has an upcoming appointment with mental health at [facility].  Glucoses have been in the 170 to as high as 300 after meals.  Denies any chest pain or dyspnea.  01/20/2020 This is a telehealth phone visit.  This patient formally was in the [place] but now has moved in with his daughter in [place].  He became ill on January 22 and was tested the same day and was Covid positive in [facility].  The patient notes wheezing cough lightheadedness and significant fatigue at this time.  He did have also some diarrhea which is slowly resolving but still present.  The patient is also lost his sense of smell.  He also has headaches as well.  Patient denies significant shortness of breath.  He is currently on his fifth day of illness and he is a candidate for monoclonal antibody infusion secondary to his diabetes and hypertension and age  He does not monitor his blood sugar and he is run out of his Lantus  02/16/2020 This is a follow-up telephone visit from a January phone visit.  This patient has history of type 2 diabetes with diabetic neuropathy ongoing tobacco use and hypertension.  Note in January he did develop Covid infection and did receive monoclonal antibody therapy on January 28.  He states he did very well with this.  He states he  has had no other symptoms from the Covid at this time.  Patient states his blood sugars have been 130-140 before meals.  He denies any alcohol use.  He now has full Medicaid.  He is in need refills on his medications.  He would like to have his meds to go to a pharmacy in [place] where he is now living.  He currently is still not have any employment.  He still complaining of significant neuropathy type pain and he awakens it night with this  05/10/2020 Patient seen by way of a telephone visit and is doing well overall blood sugars in the 130s fasting he is not drinking his smoking is down to half a pack a day he does not monitor his blood pressure at home.  He does complain of chronic neuropathy.  06/07/2020 Patient returns today for an in office exam complaining of pain in the left leg with recent emergency room visit negative for deep venous thrombosis.  He states the gabapentin has been given for this and it is not helping much.  The pain is in the left upper leg and goes down to the knee.  It feels like a cramping sensation.  Note his weight is up from previous encounters.  He states he is compliant with his diabetes regimen but he is not compliant with his diet.  He denies any shortness of breath or headaches.  He still smokes 1/2 pack a day  of cigarettes.  He is on the Lantus 40 units at bedtime 1000 mg twice daily of metformin.  He follows up with his mental health provider as well.   Past Medical History:  Diagnosis Date  . COVID-19 virus infection 01/20/2020  . Diabetes mellitus   . Diabetic neuropathy ([place])   . High cholesterol   . Hypertension   . Schizophrenia ([place])   . Toenail fungus 08/17/2019     Family History  Problem Relation Age of Onset  . Hypertension Mother   . Cancer Father   . Alcoholism Other      Social History   Socioeconomic History  . Marital status: Single    Spouse name: Not on file  . Number of children: Not on file  . Years of education: Not on file  .  Highest education level: Not on file  Occupational History  . Not on file  Tobacco Use  . Smoking status: Current Every Day Smoker    Packs/day: 0.50    Types: Cigarettes  . Smokeless tobacco: Never Used  Vaping Use  . Vaping Use: Never used  Substance and Sexual Activity  . Alcohol use: No  . Drug use: No  . Sexual activity: Yes  Other Topics Concern  . Not on file  Social History Narrative  . Not on file   Social Determinants of Health   Financial Resource Strain:   . Difficulty of Paying Living Expenses:   Food Insecurity:   . Worried About Charity fundraiser in the Last Year:   . Arboriculturist in the Last Year:   Transportation Needs:   . Film/video editor (Medical):   Marland Kitchen Lack of Transportation (Non-Medical):   Physical Activity:   . Days of Exercise per Week:   . Minutes of Exercise per Session:   Stress:   . Feeling of Stress :   Social Connections:   . Frequency of Communication with Friends and Family:   . Frequency of Social Gatherings with Friends and Family:   . Attends Religious Services:   . Active Member of Clubs or Organizations:   . Attends Archivist Meetings:   Marland Kitchen Marital Status:   Intimate Partner Violence:   . Fear of Current or Ex-Partner:   . Emotionally Abused:   Marland Kitchen Physically Abused:   . Sexually Abused:      Allergies  Allergen Reactions  . Benadryl [Diphenhydramine Hcl]   . Diphenhydramine     GI upset, paradoxic agitation  . Haldol [Haloperidol Lactate]   . Haldol [Haloperidol]     "makes me tremble, bite my tongue"  . Lisinopril Swelling  . Thorazine [Chlorpromazine]     Tremble, bite my tongue  . Thorazine [Chlorpromazine]      Outpatient Medications Prior to Visit  Medication Sig Dispense Refill  . albuterol (VENTOLIN HFA) 108 (90 Base) MCG/ACT inhaler INHALE 2 PUFFS INTO THE LUNGS EVERY 4 (FOUR) HOURS AS NEEDED FOR WHEEZING OR SHORTNESS OF BREATH. 18 g 2  . Blood Glucose Monitoring Suppl (TRUE METRIX METER)  w/Device KIT Use as instructed to check blood sugar daily. 1 kit 0  . glucose blood (TRUE METRIX BLOOD GLUCOSE TEST) test strip Use as instructed to check blood sugar daily. 100 each 12  . ibuprofen (ADVIL) 800 MG tablet Take 1 tablet (800 mg total) by mouth every 8 (eight) hours as needed for mild pain. 30 tablet 0  . amLODipine (NORVASC) 10 MG tablet Take 1 tablet (  10 mg total) by mouth daily. 90 tablet 3  . aspirin EC 81 MG tablet Take 1 tablet (81 mg total) by mouth daily. 30 tablet 1  . hydrochlorothiazide (HYDRODIURIL) 25 MG tablet Take 1 tablet (25 mg total) by mouth daily. 30 tablet 3  . insulin glargine (LANTUS SOLOSTAR) 100 UNIT/ML Solostar Pen Inject 40 Units into the skin at bedtime. 5 pen 11  . Insulin Pen Needle 29G X 5MM MISC Use with Lantus pen 100 each 0  . metFORMIN (GLUCOPHAGE) 1000 MG tablet Take 1 tablet (1,000 mg total) by mouth 2 (two) times daily with a meal. 180 tablet 1  . OLANZapine (ZYPREXA) 15 MG tablet TAKE ONE TABLET BY MOUTH AT BEDTIME. 30 tablet 1  . TRUEplus Lancets 28G MISC Use as instructed to check blood sugar daily. 100 each 11  . atorvastatin (LIPITOR) 20 MG tablet Take 1 tablet (20 mg total) by mouth daily. Reported on 05/01/2016 (Patient not taking: Reported on 06/07/2020) 90 tablet 3  . gabapentin (NEURONTIN) 300 MG capsule Take 2 capsules (600 mg total) by mouth 3 (three) times daily. (Patient not taking: Reported on 06/07/2020) 180 capsule 1   No facility-administered medications prior to visit.   Review of Systems  Pos In BOLD  Constitutional:  weight loss, night sweats,  Fevers, chills, fatigue, lassitude. HEENT:   headaches,  Difficulty swallowing,  Tooth/dental problems,  Sore throat,                No sneezing, itching, ear ache, nasal congestion, post nasal drip,   CV:   chest pain,  Orthopnea, PND, swelling in lower extremities, anasarca, dizziness, palpitations  GI heartburn, indigestion, abdominal pain, nausea, vomiting, diarrhea, change in  bowel habits, loss of appetite  Resp: No shortness of breath with exertion or at rest.  No excess mucus, no productive cough,  No non-productive cough,  No coughing up of blood.  No change in color of mucus.  No wheezing.  No chest wall deformity  Skin: no rash or lesions.  GU: no dysuria, change in color of urine, no urgency or frequency.  No flank pain.  MS:  No joint pain or swelling.  No decreased range of motion.  No back pain.   Pain in both lower legs neuropathic  Psych:  No change in mood or affect. No depression or anxiety.  No memory loss.     Objective:   Physical Exam    Vitals:   06/07/20 1415  BP: (!) 166/90  Pulse: (!) 106  Temp: 97.9 F (36.6 C)  TempSrc: Temporal  SpO2: 96%  Weight: 199 lb 3.2 oz (90.4 kg)  Height: '6\' 1"'$  (1.854 m)    Gen: Pleasant, well-nourished, in no distress,  normal affect  ENT: No lesions,  mouth clear,  oropharynx clear, no postnasal drip  Neck: No JVD, no TMG, no carotid bruits  Lungs: No use of accessory muscles, no dullness to percussion, clear without rales or rhonchi  Cardiovascular: RRR, heart sounds normal, no murmur or gallops, no peripheral edema  Abdomen: soft and NT, no HSM,  BS normal  Musculoskeletal: No deformities, no cyanosis or clubbing  Neuro: alert, non focal  Skin: Warm, no lesions or rashes  No results found.  Assessment & Plan:  I personally reviewed all images and lab data in the [facility] system as well as any outside material available during this office visit and agree with the  radiology impressions.   Essential hypertension ThiazideHypertension under fair control  will refill amlodipine 10 mg daily , and hydrochlorthiazide 25 mg daily.  Uncontrolled type 2 diabetes mellitus with hyperglycemia (HCC) Patient's A1c today is at 9.3 and glucose on arrival over 280  I had the patient speak to our clinical pharmacist we offered Trulicity but the patient is not interested in adding this at this time  instead we will increase Lantus to 50 units daily and continue Metformin twice daily at 1000 mg  Chronic paranoid schizophrenia ([place]) History of chronic paranoid schizophrenia currently on Zyprexa  I have refilled these medications 15 mg daily  DM (diabetes mellitus), type 2 with neurological complications ([place]) I suspect the leg pain is due to diabetic neuropathy  Plan will be discontinue gabapentin and give a trial of Cymbalta 60 mg daily   Kaylin was seen today for follow-up.  Diagnoses and all orders for this visit:  DM (diabetes mellitus), type 2 with neurological complications ([place]) -     Glucose (CBG) -     HgB A1c  Colon cancer screening -     Fecal occult blood, imunochemical  Essential hypertension  Uncontrolled type 2 diabetes mellitus with hyperglycemia (HCC)  Chronic paranoid schizophrenia ([place])  Other orders -     amLODipine (NORVASC) 10 MG tablet; Take 1 tablet (10 mg total) by mouth daily. -     aspirin EC 81 MG tablet; Take 1 tablet (81 mg total) by mouth daily. -     atorvastatin (LIPITOR) 20 MG tablet; Take 1 tablet (20 mg total) by mouth daily. Reported on 05/01/2016 -     hydrochlorothiazide (HYDRODIURIL) 25 MG tablet; Take 1 tablet (25 mg total) by mouth daily. -     insulin glargine (LANTUS SOLOSTAR) 100 UNIT/ML Solostar Pen; Inject 50 Units into the skin at bedtime. -     Insulin Pen Needle 29G X 5MM MISC; Use with Lantus pen -     metFORMIN (GLUCOPHAGE) 1000 MG tablet; Take 1 tablet (1,000 mg total) by mouth 2 (two) times daily with a meal. -     OLANZapine (ZYPREXA) 15 MG tablet; Take 1 tablet (15 mg total) by mouth at bedtime. -     DULoxetine (CYMBALTA) 60 MG capsule; Take 1 capsule (60 mg total) by mouth daily.  Note the patient did have an ophthalmology eye exam in May.  The patient will receive a fecal occult card for colon cancer screening.  He is going to get a Covid vaccine in the next month.

## 2020-06-07 ENCOUNTER — Ambulatory Visit: Payer: Medicaid Other | Attending: Critical Care Medicine | Admitting: Critical Care Medicine

## 2020-06-07 ENCOUNTER — Encounter: Payer: Self-pay | Admitting: Critical Care Medicine

## 2020-06-07 ENCOUNTER — Other Ambulatory Visit: Payer: Self-pay

## 2020-06-07 VITALS — BP 166/90 | HR 106 | Temp 97.9°F | Ht 73.0 in | Wt 199.2 lb

## 2020-06-07 DIAGNOSIS — F200 Paranoid schizophrenia: Secondary | ICD-10-CM | POA: Diagnosis not present

## 2020-06-07 DIAGNOSIS — F319 Bipolar disorder, unspecified: Secondary | ICD-10-CM | POA: Insufficient documentation

## 2020-06-07 DIAGNOSIS — Z791 Long term (current) use of non-steroidal anti-inflammatories (NSAID): Secondary | ICD-10-CM | POA: Diagnosis not present

## 2020-06-07 DIAGNOSIS — Z79899 Other long term (current) drug therapy: Secondary | ICD-10-CM | POA: Insufficient documentation

## 2020-06-07 DIAGNOSIS — Z8249 Family history of ischemic heart disease and other diseases of the circulatory system: Secondary | ICD-10-CM | POA: Diagnosis not present

## 2020-06-07 DIAGNOSIS — Z888 Allergy status to other drugs, medicaments and biological substances status: Secondary | ICD-10-CM | POA: Insufficient documentation

## 2020-06-07 DIAGNOSIS — Z794 Long term (current) use of insulin: Secondary | ICD-10-CM | POA: Insufficient documentation

## 2020-06-07 DIAGNOSIS — M25562 Pain in left knee: Secondary | ICD-10-CM | POA: Insufficient documentation

## 2020-06-07 DIAGNOSIS — F17210 Nicotine dependence, cigarettes, uncomplicated: Secondary | ICD-10-CM | POA: Insufficient documentation

## 2020-06-07 DIAGNOSIS — I10 Essential (primary) hypertension: Secondary | ICD-10-CM | POA: Diagnosis not present

## 2020-06-07 DIAGNOSIS — E1165 Type 2 diabetes mellitus with hyperglycemia: Secondary | ICD-10-CM | POA: Diagnosis not present

## 2020-06-07 DIAGNOSIS — Z1211 Encounter for screening for malignant neoplasm of colon: Secondary | ICD-10-CM | POA: Diagnosis not present

## 2020-06-07 DIAGNOSIS — E7800 Pure hypercholesterolemia, unspecified: Secondary | ICD-10-CM | POA: Insufficient documentation

## 2020-06-07 DIAGNOSIS — Z7982 Long term (current) use of aspirin: Secondary | ICD-10-CM | POA: Diagnosis not present

## 2020-06-07 DIAGNOSIS — Z8616 Personal history of COVID-19: Secondary | ICD-10-CM | POA: Diagnosis not present

## 2020-06-07 DIAGNOSIS — E1149 Type 2 diabetes mellitus with other diabetic neurological complication: Secondary | ICD-10-CM | POA: Diagnosis not present

## 2020-06-07 DIAGNOSIS — E1140 Type 2 diabetes mellitus with diabetic neuropathy, unspecified: Secondary | ICD-10-CM | POA: Insufficient documentation

## 2020-06-07 LAB — POCT GLYCOSYLATED HEMOGLOBIN (HGB A1C): Hemoglobin A1C: 9.3 % — AB (ref 4.0–5.6)

## 2020-06-07 LAB — GLUCOSE, POCT (MANUAL RESULT ENTRY): POC Glucose: 281 mg/dl — AB (ref 70–99)

## 2020-06-07 MED ORDER — LANTUS SOLOSTAR 100 UNIT/ML ~~LOC~~ SOPN: 50.0000 [IU] | PEN_INJECTOR | Freq: Every day | SUBCUTANEOUS | 11 refills | Status: DC

## 2020-06-07 MED ORDER — DULOXETINE HCL 60 MG PO CPEP: 60.0000 mg | ORAL_CAPSULE | Freq: Every day | ORAL | 3 refills | Status: DC

## 2020-06-07 MED ORDER — OLANZAPINE 15 MG PO TABS: 15.0000 mg | ORAL_TABLET | Freq: Every day | ORAL | 1 refills | Status: DC

## 2020-06-07 MED ORDER — HYDROCHLOROTHIAZIDE 25 MG PO TABS: 25.0000 mg | ORAL_TABLET | Freq: Every day | ORAL | 3 refills | Status: DC

## 2020-06-07 MED ORDER — METFORMIN HCL 1000 MG PO TABS: 1000.0000 mg | ORAL_TABLET | Freq: Two times a day (BID) | ORAL | 1 refills | Status: DC

## 2020-06-07 MED ORDER — INSULIN PEN NEEDLE 29G X 5MM MISC: 0 refills | Status: DC

## 2020-06-07 MED ORDER — ASPIRIN EC 81 MG PO TBEC: 81.0000 mg | DELAYED_RELEASE_TABLET | Freq: Every day | ORAL | 1 refills | Status: DC

## 2020-06-07 MED ORDER — AMLODIPINE BESYLATE 10 MG PO TABS: 10.0000 mg | ORAL_TABLET | Freq: Every day | ORAL | 3 refills | Status: DC

## 2020-06-07 MED ORDER — ATORVASTATIN CALCIUM 20 MG PO TABS: 20.0000 mg | ORAL_TABLET | Freq: Every day | ORAL | 3 refills | Status: DC

## 2020-06-07 NOTE — Assessment & Plan Note (Signed)
Patient's A1c today is at 9.3 and glucose on arrival over 280  I had the patient speak to our clinical pharmacist we offered Trulicity but the patient is not interested in adding this at this time instead we will increase Lantus to 50 units daily and continue Metformin twice daily at 1000 mg

## 2020-06-07 NOTE — Assessment & Plan Note (Signed)
I suspect the leg pain is due to diabetic neuropathy  Plan will be discontinue gabapentin and give a trial of Cymbalta 60 mg daily

## 2020-06-07 NOTE — Assessment & Plan Note (Signed)
ThiazideHypertension under fair control will refill amlodipine 10 mg daily , and hydrochlorthiazide 25 mg daily.

## 2020-06-07 NOTE — Patient Instructions (Signed)
Stop gabapentin Start cymbalta one daily for nerve pain in legs Increase Lantus to 50 units daily Stay on metformin twice daily No other medication changes Return to Dr Delford Field in two months

## 2020-06-07 NOTE — Assessment & Plan Note (Signed)
History of chronic paranoid schizophrenia currently on Zyprexa  I have refilled these medications 15 mg daily

## 2020-08-02 ENCOUNTER — Encounter: Payer: Self-pay | Admitting: Critical Care Medicine

## 2020-08-02 ENCOUNTER — Other Ambulatory Visit: Payer: Self-pay

## 2020-08-02 ENCOUNTER — Ambulatory Visit: Payer: Medicaid Other | Attending: Critical Care Medicine | Admitting: Critical Care Medicine

## 2020-08-02 VITALS — BP 135/85 | HR 99 | Temp 98.1°F | Resp 16 | Ht 72.0 in | Wt 203.0 lb

## 2020-08-02 DIAGNOSIS — F17210 Nicotine dependence, cigarettes, uncomplicated: Secondary | ICD-10-CM | POA: Insufficient documentation

## 2020-08-02 DIAGNOSIS — Z791 Long term (current) use of non-steroidal anti-inflammatories (NSAID): Secondary | ICD-10-CM | POA: Diagnosis not present

## 2020-08-02 DIAGNOSIS — Z8249 Family history of ischemic heart disease and other diseases of the circulatory system: Secondary | ICD-10-CM | POA: Insufficient documentation

## 2020-08-02 DIAGNOSIS — Z1211 Encounter for screening for malignant neoplasm of colon: Secondary | ICD-10-CM | POA: Diagnosis not present

## 2020-08-02 DIAGNOSIS — Z794 Long term (current) use of insulin: Secondary | ICD-10-CM | POA: Insufficient documentation

## 2020-08-02 DIAGNOSIS — F319 Bipolar disorder, unspecified: Secondary | ICD-10-CM | POA: Diagnosis not present

## 2020-08-02 DIAGNOSIS — Z8616 Personal history of COVID-19: Secondary | ICD-10-CM | POA: Diagnosis not present

## 2020-08-02 DIAGNOSIS — E1149 Type 2 diabetes mellitus with other diabetic neurological complication: Secondary | ICD-10-CM | POA: Insufficient documentation

## 2020-08-02 DIAGNOSIS — Z79899 Other long term (current) drug therapy: Secondary | ICD-10-CM | POA: Diagnosis not present

## 2020-08-02 DIAGNOSIS — E1140 Type 2 diabetes mellitus with diabetic neuropathy, unspecified: Secondary | ICD-10-CM | POA: Insufficient documentation

## 2020-08-02 DIAGNOSIS — E1165 Type 2 diabetes mellitus with hyperglycemia: Secondary | ICD-10-CM | POA: Diagnosis not present

## 2020-08-02 DIAGNOSIS — E7800 Pure hypercholesterolemia, unspecified: Secondary | ICD-10-CM | POA: Diagnosis not present

## 2020-08-02 DIAGNOSIS — Z888 Allergy status to other drugs, medicaments and biological substances status: Secondary | ICD-10-CM | POA: Diagnosis not present

## 2020-08-02 DIAGNOSIS — F200 Paranoid schizophrenia: Secondary | ICD-10-CM | POA: Insufficient documentation

## 2020-08-02 DIAGNOSIS — I10 Essential (primary) hypertension: Secondary | ICD-10-CM | POA: Diagnosis not present

## 2020-08-02 DIAGNOSIS — N529 Male erectile dysfunction, unspecified: Secondary | ICD-10-CM | POA: Insufficient documentation

## 2020-08-02 DIAGNOSIS — N5201 Erectile dysfunction due to arterial insufficiency: Secondary | ICD-10-CM | POA: Diagnosis not present

## 2020-08-02 DIAGNOSIS — Z7982 Long term (current) use of aspirin: Secondary | ICD-10-CM | POA: Insufficient documentation

## 2020-08-02 LAB — GLUCOSE, POCT (MANUAL RESULT ENTRY): POC Glucose: 303 mg/dl — AB (ref 70–99)

## 2020-08-02 MED ORDER — TRULICITY 0.75 MG/0.5ML ~~LOC~~ SOAJ: 0.7500 mg | SUBCUTANEOUS | 5 refills | Status: DC

## 2020-08-02 MED ORDER — DULOXETINE HCL 60 MG PO CPEP: 60.0000 mg | ORAL_CAPSULE | Freq: Every day | ORAL | 3 refills | Status: DC

## 2020-08-02 MED ORDER — METFORMIN HCL 1000 MG PO TABS: 1000.0000 mg | ORAL_TABLET | Freq: Two times a day (BID) | ORAL | 1 refills | Status: DC

## 2020-08-02 MED ORDER — AMLODIPINE BESYLATE 10 MG PO TABS: 10.0000 mg | ORAL_TABLET | Freq: Every day | ORAL | 3 refills | Status: DC

## 2020-08-02 MED ORDER — ATORVASTATIN CALCIUM 20 MG PO TABS: 20.0000 mg | ORAL_TABLET | Freq: Every day | ORAL | 3 refills | Status: DC

## 2020-08-02 MED ORDER — LANTUS SOLOSTAR 100 UNIT/ML ~~LOC~~ SOPN: 50.0000 [IU] | PEN_INJECTOR | Freq: Every day | SUBCUTANEOUS | 11 refills | Status: DC

## 2020-08-02 MED ORDER — HYDROCHLOROTHIAZIDE 25 MG PO TABS: 25.0000 mg | ORAL_TABLET | Freq: Every day | ORAL | 3 refills | Status: DC

## 2020-08-02 NOTE — Assessment & Plan Note (Signed)
Will refill olanzapine and the patient will continue follow-up with mental health

## 2020-08-02 NOTE — Assessment & Plan Note (Signed)
Hypertension with adequate control at this visit we will continue current medication profile

## 2020-08-02 NOTE — Assessment & Plan Note (Signed)
Uncontrolled diabetes with hemoglobin A1c of 9.3 blood sugar 303 at this visit  Plan will be to begin Trulicity 75 mcg weekly, Continue Metformin at 1000 mg twice daily and increase Lantus to 50 units daily

## 2020-08-02 NOTE — Progress Notes (Signed)
Subjective:    Patient ID: [name], male    DOB: Oct 05, 1961, 59 y.o.   MRN: [id number]  History of Present Illness:  This is a 59 year old male seen recently in the [place] [facility] and was released from prison recently has history of hypertension type 2 diabetes and bipolar disorder.  The patient has had now given access to Lantus and was taking 35 units at bedtime patient also maintains atorvastatin, hydrochlorthiazide,Metformin, and amlodipine.  The patient notes occasional burning in the feet.  He had been on gabapentin previously but is not on gabapentin at this time.  I did resume Zyprexa on this patient 15 mg at bedtime and he has an upcoming appointment with mental health at [facility].  Glucoses have been in the 170 to as high as 300 after meals.  Denies any chest pain or dyspnea.  01/20/2020 This is a telehealth phone visit.  This patient formally was in the [place] but now has moved in with his daughter in [place].  He became ill on January 22 and was tested the same day and was Covid positive in [facility].  The patient notes wheezing cough lightheadedness and significant fatigue at this time.  He did have also some diarrhea which is slowly resolving but still present.  The patient is also lost his sense of smell.  He also has headaches as well.  Patient denies significant shortness of breath.  He is currently on his fifth day of illness and he is a candidate for monoclonal antibody infusion secondary to his diabetes and hypertension and age  He does not monitor his blood sugar and he is run out of his Lantus  02/16/2020 This is a follow-up telephone visit from a January phone visit.  This patient has history of type 2 diabetes with diabetic neuropathy ongoing tobacco use and hypertension.  Note in January he did develop Covid infection and did receive monoclonal antibody therapy on January 28.  He states he did very well with this.  He states he  has had no other symptoms from the Covid at this time.  Patient states his blood sugars have been 130-140 before meals.  He denies any alcohol use.  He now has full Medicaid.  He is in need refills on his medications.  He would like to have his meds to go to a pharmacy in [place] where he is now living.  He currently is still not have any employment.  He still complaining of significant neuropathy type pain and he awakens it night with this  05/10/2020 Patient seen by way of a telephone visit and is doing well overall blood sugars in the 130s fasting he is not drinking his smoking is down to half a pack a day he does not monitor his blood pressure at home.  He does complain of chronic neuropathy.  06/07/2020 Patient returns today for an in office exam complaining of pain in the left leg with recent emergency room visit negative for deep venous thrombosis.  He states the gabapentin has been given for this and it is not helping much.  The pain is in the left upper leg and goes down to the knee.  It feels like a cramping sensation.  Note his weight is up from previous encounters.  He states he is compliant with his diabetes regimen but he is not compliant with his diet.  He denies any shortness of breath or headaches.  He still smokes 1/2 pack a day  of cigarettes.  He is on the Lantus 40 units at bedtime 1000 mg twice daily of metformin.  He follows up with his mental health provider as well.  08/02/2020  This patient is seen in return follow-up for hypertension and poorly controlled diabetes type 2 Schizophrenia now complains of erectile dysfunction  [name] been very resistive to receiving a Covid vaccine due to prior comments will have made with this patient on this.  The patient states his blood sugars have been in the 170s to 150 range at home and he has been compliant with his insulin program.  Patient continues to smoke about 5 cigarettes daily.  There are no other new complaints.  Past  Medical History:  Diagnosis Date  . COVID-19 virus infection 01/20/2020  . Diabetes mellitus   . Diabetic neuropathy ([place])   . High cholesterol   . Hypertension   . Schizophrenia ([place])   . Toenail fungus 08/17/2019     Family History  Problem Relation Age of Onset  . Hypertension Mother   . Cancer Father   . Alcoholism Other      Social History   Socioeconomic History  . Marital status: Single    Spouse name: Not on file  . Number of children: Not on file  . Years of education: Not on file  . Highest education level: Not on file  Occupational History  . Not on file  Tobacco Use  . Smoking status: Current Every Day Smoker    Packs/day: 0.50    Types: Cigarettes  . Smokeless tobacco: Never Used  Vaping Use  . Vaping Use: Never used  Substance and Sexual Activity  . Alcohol use: No  . Drug use: No  . Sexual activity: Yes  Other Topics Concern  . Not on file  Social History Narrative  . Not on file   Social Determinants of Health   Financial Resource Strain:   . Difficulty of Paying Living Expenses:   Food Insecurity:   . Worried About Charity fundraiser in the Last Year:   . Arboriculturist in the Last Year:   Transportation Needs:   . Film/video editor (Medical):   Marland Kitchen Lack of Transportation (Non-Medical):   Physical Activity:   . Days of Exercise per Week:   . Minutes of Exercise per Session:   Stress:   . Feeling of Stress :   Social Connections:   . Frequency of Communication with Friends and Family:   . Frequency of Social Gatherings with Friends and Family:   . Attends Religious Services:   . Active Member of Clubs or Organizations:   . Attends Archivist Meetings:   Marland Kitchen Marital Status:   Intimate Partner Violence:   . Fear of Current or Ex-Partner:   . Emotionally Abused:   Marland Kitchen Physically Abused:   . Sexually Abused:      Allergies  Allergen Reactions  . Benadryl [Diphenhydramine Hcl]   . Diphenhydramine     GI upset, paradoxic  agitation  . Haldol [Haloperidol Lactate]   . Haldol [Haloperidol]     "makes me tremble, bite my tongue"  . Lisinopril Swelling  . Thorazine [Chlorpromazine]     Tremble, bite my tongue  . Thorazine [Chlorpromazine]      Outpatient Medications Prior to Visit  Medication Sig Dispense Refill  . albuterol (VENTOLIN HFA) 108 (90 Base) MCG/ACT inhaler INHALE 2 PUFFS INTO THE LUNGS EVERY 4 (FOUR) HOURS AS NEEDED FOR WHEEZING  OR SHORTNESS OF BREATH. 18 g 2  . aspirin EC 81 MG tablet Take 1 tablet (81 mg total) by mouth daily. 30 tablet 1  . Blood Glucose Monitoring Suppl (TRUE METRIX METER) w/Device KIT Use as instructed to check blood sugar daily. 1 kit 0  . glucose blood (TRUE METRIX BLOOD GLUCOSE TEST) test strip Use as instructed to check blood sugar daily. 100 each 12  . Insulin Pen Needle 29G X 5MM MISC Use with Lantus pen 100 each 0  . OLANZapine (ZYPREXA) 15 MG tablet Take 1 tablet (15 mg total) by mouth at bedtime. 30 tablet 1  . TRUEplus Lancets 28G MISC Use as instructed to check blood sugar daily. 100 each 11  . amLODipine (NORVASC) 10 MG tablet Take 1 tablet (10 mg total) by mouth daily. 90 tablet 3  . atorvastatin (LIPITOR) 20 MG tablet Take 1 tablet (20 mg total) by mouth daily. Reported on 05/01/2016 90 tablet 3  . DULoxetine (CYMBALTA) 60 MG capsule Take 1 capsule (60 mg total) by mouth daily. 30 capsule 3  . hydrochlorothiazide (HYDRODIURIL) 25 MG tablet Take 1 tablet (25 mg total) by mouth daily. 30 tablet 3  . insulin glargine (LANTUS SOLOSTAR) 100 UNIT/ML Solostar Pen Inject 50 Units into the skin at bedtime. 5 pen 11  . metFORMIN (GLUCOPHAGE) 1000 MG tablet Take 1 tablet (1,000 mg total) by mouth 2 (two) times daily with a meal. 180 tablet 1  . BD ULTRA-FINE PEN NEEDLES 29G X 12.7MM MISC USE WITH LANTUS PEN    . ibuprofen (ADVIL) 800 MG tablet Take 1 tablet (800 mg total) by mouth every 8 (eight) hours as needed for mild pain. (Patient not taking: Reported on 08/02/2020) 30  tablet 0   No facility-administered medications prior to visit.   Review of Systems  Pos In BOLD  Constitutional:  weight loss, night sweats,  Fevers, chills, fatigue, lassitude. HEENT:   headaches,  Difficulty swallowing,  Tooth/dental problems,  Sore throat,                No sneezing, itching, ear ache, nasal congestion, post nasal drip,   CV:   chest pain,  Orthopnea, PND, swelling in lower extremities, anasarca, dizziness, palpitations  GI heartburn, indigestion, abdominal pain, nausea, vomiting, diarrhea, change in bowel habits, loss of appetite  Resp: No shortness of breath with exertion or at rest.  No excess mucus, no productive cough,  No non-productive cough,  No coughing up of blood.  No change in color of mucus.  No wheezing.  No chest wall deformity  Skin: no rash or lesions.  GU: no dysuria, change in color of urine, no urgency or frequency.  No flank pain.  MS:  No joint pain or swelling.  No decreased range of motion.  No back pain.   Pain in both lower legs neuropathic  Psych:  No change in mood or affect. No depression or anxiety.  No memory loss.     Objective:   Physical Exam   Vitals:   08/02/20 1411  BP: 135/85  Pulse: 99  Resp: 16  Temp: 98.1 F (36.7 C)  SpO2: 98%  Weight: 203 lb (92.1 kg)  Height: 6' (1.829 m)    Gen: Pleasant, well-nourished, in no distress,  normal affect  ENT: No lesions,  mouth clear,  oropharynx clear, no postnasal drip  Neck: No JVD, no TMG, no carotid bruits  Lungs: No use of accessory muscles, no dullness to percussion, clear without rales  or rhonchi  Cardiovascular: RRR, heart sounds normal, no murmur or gallops, no peripheral edema  Abdomen: soft and NT, no HSM,  BS normal  Musculoskeletal: No deformities, no cyanosis or clubbing  Neuro: alert, non focal  Skin: Warm, no lesions or rashes  No results found.  No results found.  Assessment & Plan:  I personally reviewed all images and lab data in the [facility]  system as well as any outside material available during this office visit and agree with the  radiology impressions.   Essential hypertension Hypertension with adequate control at this visit we will continue current medication profile  Chronic paranoid schizophrenia ([place]) Will refill olanzapine and the patient will continue follow-up with mental health  Uncontrolled type 2 diabetes mellitus with hyperglycemia ([place]) Uncontrolled diabetes with hemoglobin A1c of 9.3 blood sugar 303 at this visit  Plan will be to begin Trulicity 75 mcg weekly, Continue Metformin at 1000 mg twice daily and increase Lantus to 50 units daily   Diagnoses and all orders for this visit:  DM (diabetes mellitus), type 2 with neurological complications ([place]) -     POCT glucose (manual entry) -     Microalbumin / creatinine urine ratio -     Comprehensive metabolic panel -     CBC with Differential/Platelet  Erectile dysfunction due to arterial insufficiency -     Testosterone  Pure hypercholesterolemia -     Lipid panel  Colon cancer screening -     Fecal occult blood, imunochemical  Essential hypertension  Chronic paranoid schizophrenia ([place])  Uncontrolled type 2 diabetes mellitus with hyperglycemia ([place])  Other orders -     metFORMIN (GLUCOPHAGE) 1000 MG tablet; Take 1 tablet (1,000 mg total) by mouth 2 (two) times daily with a meal. -     amLODipine (NORVASC) 10 MG tablet; Take 1 tablet (10 mg total) by mouth daily. -     atorvastatin (LIPITOR) 20 MG tablet; Take 1 tablet (20 mg total) by mouth daily. Reported on 05/01/2016 -     hydrochlorothiazide (HYDRODIURIL) 25 MG tablet; Take 1 tablet (25 mg total) by mouth daily. -     insulin glargine (LANTUS SOLOSTAR) 100 UNIT/ML Solostar Pen; Inject 50 Units into the skin at bedtime. -     DULoxetine (CYMBALTA) 60 MG capsule; Take 1 capsule (60 mg total) by mouth daily. -     Dulaglutide (TRULICITY) 6.64 QI/3.4VQ SOPN; Inject 0.5 mLs (0.75 mg total) into the  skin once a week.  . Smoking use    . Current smoking consumption amount: 4 cigarettes daily  . Dicsussion on advise to quit smoking and smoking impacts: On overall health  . Patient's willingness to quit: Willing to quit  . Methods to quit smoking discussed: Nicotine replacement  . Resources provided:  AVS   . Setting quit date not yet set  . Follow-up arranged patient return in 2 months   Time spent counseling the patient: 5 minutes

## 2020-08-02 NOTE — Progress Notes (Signed)
Here for DM check/ concerns of sexual drive

## 2020-08-02 NOTE — Patient Instructions (Addendum)
Start trulicity one injection weekly  COntinue lantus 50 units weekly  Continue metformine twice a day  Start Cialis one tablet as needed for erectile dysfunction  Return Dr Joya Gaskins 2 months  Labs will be obtained today,  A fecal stool kit will be given to you to check for blood I your stool  Follow diet below  Please get a Covid vaccine COVID-19 Vaccine Information can be found at: ShippingScam.co.uk For questions related to vaccine distribution or appointments, please email [email or web address] or call [phone].     Diabetes Basics  Diabetes (diabetes mellitus) is a long-term (chronic) disease. It occurs when the body does not properly use sugar (glucose) that is released from food after you eat. Diabetes may be caused by one or both of these problems:  Your pancreas does not make enough of a hormone called insulin.  Your body does not react in a normal way to insulin that it makes. Insulin lets sugars (glucose) go into cells in your body. This gives you energy. If you have diabetes, sugars cannot get into cells. This causes high blood sugar (hyperglycemia). Follow these instructions at home: How is diabetes treated? You may need to take insulin or other diabetes medicines daily to keep your blood sugar in balance. Take your diabetes medicines every day as told by your doctor. List your diabetes medicines here: Diabetes medicines  Name of medicine: ______________________________ ? Amount (dose): _______________ Time (a.m./p.m.): _______________ Notes: ___________________________________  Name of medicine: ______________________________ ? Amount (dose): _______________ Time (a.m./p.m.): _______________ Notes: ___________________________________  Name of medicine: ______________________________ ? Amount (dose): _______________ Time (a.m./p.m.): _______________ Notes: ___________________________________ If you use  insulin, you will learn how to give yourself insulin by injection. You may need to adjust the amount based on the food that you eat. List the types of insulin you use here: Insulin  Insulin type: ______________________________ ? Amount (dose): _______________ Time (a.m./p.m.): _______________ Notes: ___________________________________  Insulin type: ______________________________ ? Amount (dose): _______________ Time (a.m./p.m.): _______________ Notes: ___________________________________  Insulin type: ______________________________ ? Amount (dose): _______________ Time (a.m./p.m.): _______________ Notes: ___________________________________  Insulin type: ______________________________ ? Amount (dose): _______________ Time (a.m./p.m.): _______________ Notes: ___________________________________  Insulin type: ______________________________ ? Amount (dose): _______________ Time (a.m./p.m.): _______________ Notes: ___________________________________ How do I manage my blood sugar?  Check your blood sugar levels using a blood glucose monitor as directed by your doctor. Your doctor will set treatment goals for you. Generally, you should have these blood sugar levels:  Before meals (preprandial): 80-130 mg/dL (4.4-7.2 mmol/L).  After meals (postprandial): below 180 mg/dL (10 mmol/L).  A1c level: less than 7%. Write down the times that you will check your blood sugar levels: Blood sugar checks  Time: _______________ Notes: ___________________________________  Time: _______________ Notes: ___________________________________  Time: _______________ Notes: ___________________________________  Time: _______________ Notes: ___________________________________  Time: _______________ Notes: ___________________________________  Time: _______________ Notes: ___________________________________  What do I need to know about low blood sugar? Low blood sugar is called hypoglycemia. This is when  blood sugar is at or below 70 mg/dL (3.9 mmol/L). Symptoms may include:  Feeling: ? Hungry. ? Worried or nervous (anxious). ? Sweaty and clammy. ? Confused. ? Dizzy. ? Sleepy. ? Sick to your stomach (nauseous).  Having: ? A fast heartbeat. ? A headache. ? A change in your vision. ? Tingling or no feeling (numbness) around the mouth, lips, or tongue. ? Jerky movements that you cannot control (seizure).  Having trouble with: ? Moving (coordination). ? Sleeping. ? Passing out (fainting). ? Getting upset easily (irritability). Treating low  blood sugar To treat low blood sugar, eat or drink something sugary right away. If you can think clearly and swallow safely, follow the 15:15 rule:  Take 15 grams of a fast-acting carb (carbohydrate). Talk with your doctor about how much you should take.  Some fast-acting carbs are: ? Sugar tablets (glucose pills). Take 3-4 glucose pills. ? 6-8 pieces of hard candy. ? 4-6 oz (120-150 mL) of fruit juice. ? 4-6 oz (120-150 mL) of regular (not diet) soda. ? 1 Tbsp (15 mL) honey or sugar.  Check your blood sugar 15 minutes after you take the carb.  If your blood sugar is still at or below 70 mg/dL (3.9 mmol/L), take 15 grams of a carb again.  If your blood sugar does not go above 70 mg/dL (3.9 mmol/L) after 3 tries, get help right away.  After your blood sugar goes back to normal, eat a meal or a snack within 1 hour. Treating very low blood sugar If your blood sugar is at or below 54 mg/dL (3 mmol/L), you have very low blood sugar (severe hypoglycemia). This is an emergency. Do not wait to see if the symptoms will go away. Get medical help right away. Call your local emergency services (911 in the [place] Do not drive yourself to the hospital. Questions to ask your health care provider  Do I need to meet with a diabetes educator?  What equipment will I need to care for myself at home?  What diabetes medicines do I need? When should I take  them?  How often do I need to check my blood sugar?  What number can I call if I have questions?  When is my next doctor's visit?  Where can I find a support group for people with diabetes? Where to find more information  American Diabetes Association: www.diabetes.org  American Association of Diabetes Educators: www.diabeteseducator.org/patient-resources Contact a doctor if:  Your blood sugar is at or above 240 mg/dL (13.3 mmol/L) for 2 days in a row.  You have been sick or have had a fever for 2 days or more, and you are not getting better.  You have any of these problems for more than 6 hours: ? You cannot eat or drink. ? You feel sick to your stomach (nauseous). ? You throw up (vomit). ? You have watery poop (diarrhea). Get help right away if:  Your blood sugar is lower than 54 mg/dL (3 mmol/L).  You get confused.  You have trouble: ? Thinking clearly. ? Breathing. Summary  Diabetes (diabetes mellitus) is a long-term (chronic) disease. It occurs when the body does not properly use sugar (glucose) that is released from food after digestion.  Take insulin and diabetes medicines as told.  Check your blood sugar every day, as often as told.  Keep all follow-up visits as told by your doctor. This is important. This information is not intended to replace advice given to you by your health care provider. Make sure you discuss any questions you have with your health care provider. Document Revised: 09/02/2019 Document Reviewed: 03/14/2018 Elsevier Patient Education  [place].

## 2020-08-03 ENCOUNTER — Telehealth: Payer: Self-pay

## 2020-08-03 ENCOUNTER — Other Ambulatory Visit: Payer: Self-pay | Admitting: Critical Care Medicine

## 2020-08-03 ENCOUNTER — Telehealth: Payer: Self-pay | Admitting: Critical Care Medicine

## 2020-08-03 LAB — CBC WITH DIFFERENTIAL/PLATELET
Basophils Absolute: 0.1 10*3/uL (ref 0.0–0.2)
Basos: 1 %
EOS (ABSOLUTE): 0.7 10*3/uL — ABNORMAL HIGH (ref 0.0–0.4)
Eos: 7 %
Hematocrit: 43.1 % (ref 37.5–51.0)
Hemoglobin: 14.1 g/dL (ref 13.0–17.7)
Immature Grans (Abs): 0.1 10*3/uL (ref 0.0–0.1)
Immature Granulocytes: 1 %
Lymphocytes Absolute: 1.9 10*3/uL (ref 0.7–3.1)
Lymphs: 19 %
MCH: 29.4 pg (ref 26.6–33.0)
MCHC: 32.7 g/dL (ref 31.5–35.7)
MCV: 90 fL (ref 79–97)
Monocytes Absolute: 0.8 10*3/uL (ref 0.1–0.9)
Monocytes: 8 %
Neutrophils Absolute: 6.8 10*3/uL (ref 1.4–7.0)
Neutrophils: 64 %
Platelets: 218 10*3/uL (ref 150–450)
RBC: 4.8 x10E6/uL (ref 4.14–5.80)
RDW: 13.1 % (ref 11.6–15.4)
WBC: 10.3 10*3/uL (ref 3.4–10.8)

## 2020-08-03 LAB — COMPREHENSIVE METABOLIC PANEL
ALT: 21 IU/L (ref 0–44)
AST: 23 IU/L (ref 0–40)
Albumin/Globulin Ratio: 1.4 (ref 1.2–2.2)
Albumin: 4.1 g/dL (ref 3.8–4.9)
Alkaline Phosphatase: 88 IU/L (ref 48–121)
BUN/Creatinine Ratio: 17 (ref 9–20)
BUN: 16 mg/dL (ref 6–24)
Bilirubin Total: <0.2 mg/dL (ref 0.0–1.2)
CO2: 27 mmol/L (ref 20–29)
Calcium: 9.3 mg/dL (ref 8.7–10.2)
Chloride: 100 mmol/L (ref 96–106)
Creatinine, Ser: 0.93 mg/dL (ref 0.76–1.27)
GFR calc Af Amer: 104 mL/min/{1.73_m2} (ref >59)
GFR calc non Af Amer: 90 mL/min/{1.73_m2} (ref >59)
Globulin, Total: 2.9 g/dL (ref 1.5–4.5)
Glucose: 293 mg/dL — ABNORMAL HIGH (ref 65–99)
Potassium: 4.4 mmol/L (ref 3.5–5.2)
Sodium: 140 mmol/L (ref 134–144)
Total Protein: 7 g/dL (ref 6.0–8.5)

## 2020-08-03 LAB — LIPID PANEL
Chol/HDL Ratio: 3 ratio (ref 0.0–5.0)
Cholesterol, Total: 140 mg/dL (ref 100–199)
HDL: 47 mg/dL (ref >39)
LDL Chol Calc (NIH): 75 mg/dL (ref 0–99)
Triglycerides: 96 mg/dL (ref 0–149)
VLDL Cholesterol Cal: 18 mg/dL (ref 5–40)

## 2020-08-03 LAB — TESTOSTERONE: Testosterone: 288 ng/dL (ref 264–916)

## 2020-08-03 MED ORDER — TADALAFIL 10 MG PO TABS
10.0000 mg | ORAL_TABLET | Freq: Every day | ORAL | 0 refills | Status: DC | PRN
Start: 2020-08-03 — End: 2021-01-17

## 2020-08-03 NOTE — Telephone Encounter (Signed)
Pt wants to know if Dr Delford Field wants him to continue the lantus along with the new insulin pen he prescribed to him yesterday.

## 2020-08-03 NOTE — Telephone Encounter (Signed)
Contacted pt to go over lab results pt is aware and doesn't have any questions or concerns 

## 2020-08-04 NOTE — Telephone Encounter (Signed)
Per Dr. Delford Field, pt is to continue Lantus and Trulicity. Will forward this to the nurse for Dr. Delford Field.

## 2020-08-04 NOTE — Telephone Encounter (Signed)
Called pt left VM to call back.  

## 2020-08-04 NOTE — Telephone Encounter (Signed)
Patient is calling the nurse regarding his medication. Please advise CB- [phone]

## 2020-08-05 NOTE — Telephone Encounter (Signed)
Call patient made aware of MD note

## 2020-08-30 ENCOUNTER — Other Ambulatory Visit: Payer: Self-pay | Admitting: Critical Care Medicine

## 2020-08-30 NOTE — Telephone Encounter (Signed)
Requested medication (s) are due for refill today: yes   Requested medication (s) are on the active medication list: yes   Last refill:  08/02/2020  Future visit scheduled: yes   Notes to clinic:  this refill cannot be delegated    Requested Prescriptions  Pending Prescriptions Disp Refills   OLANZapine (ZYPREXA) 15 MG tablet [Pharmacy Med Name: OLANZAPINE 15 MG TAB] 30 tablet 1    Sig: TAKE ONE TABLET BY MOUTH AT BEDTIME      Not Delegated - Psychiatry:  Antipsychotics - Second Generation (Atypical) - olanzapine Failed - 08/30/2020 12:40 PM      Failed - This refill cannot be delegated      Passed - ALT in normal range and within 360 days    ALT  Date Value Ref Range Status  08/02/2020 21 0 - 44 IU/L Final          Passed - AST in normal range and within 360 days    AST  Date Value Ref Range Status  08/02/2020 23 0 - 40 IU/L Final          Passed - Last BP in normal range    BP Readings from Last 1 Encounters:  08/02/20 135/85          Passed - Valid encounter within last 6 months    Recent Outpatient Visits           4 weeks ago DM (diabetes mellitus), type 2 with neurological complications [facility])   [place] Storm Frisk, MD   2 months ago DM (diabetes mellitus), type 2 with neurological complications [facility])   [place] [facility] Storm Frisk, MD   3 months ago Essential hypertension   [place] [facility] Storm Frisk, MD   6 months ago Uncontrolled type 2 diabetes mellitus with hyperglycemia [facility])   [place] [facility] Storm Frisk, MD   7 months ago COVID-19 virus infection   [facility] Storm Frisk, MD       Future Appointments             In 1 month Storm Frisk, MD [facility]             Signed Prescriptions Disp Refills   albuterol (PROAIR HFA) 108 (90 Base)  MCG/ACT inhaler 8.5 g 1    Sig: INHALE TWO PUFFS INTO THE LUNGS EVERY 4 HOURS AS NEEDED FOR WHEEZING/SOB      Pulmonology:  Beta Agonists Failed - 08/30/2020 12:40 PM      Failed - One inhaler should last at least one month. If the patient is requesting refills earlier, contact the patient to check for uncontrolled symptoms.      Passed - Valid encounter within last 12 months    Recent Outpatient Visits           4 weeks ago DM (diabetes mellitus), type 2 with neurological complications [facility])   [place] [facility] Storm Frisk, MD   2 months ago DM (diabetes mellitus), type 2 with neurological complications [facility])   [place] [facility] Storm Frisk, MD   3 months ago Essential hypertension   [facility] Storm Frisk, MD   6 months ago Uncontrolled type 2 diabetes mellitus with hyperglycemia [facility])   [place] [facility] Storm Frisk, MD   7 months ago COVID-19 virus infection   [facility] Storm Frisk, MD       Future Appointments             In 1 month Delford Field Charlcie Cradle, MD [facility]

## 2020-10-10 NOTE — Progress Notes (Signed)
Subjective:    Patient ID: [name], male    DOB: 08/08/1961, 59 y.o.   MRN: [id number]  History of Present Illness:  08/17/19 This is a 59 year old male seen recently in the [place] [facility] and was released from prison recently has history of hypertension type 2 diabetes and bipolar disorder.  The patient has had now given access to Lantus and was taking 35 units at bedtime patient also maintains atorvastatin, hydrochlorthiazide,Metformin, and amlodipine.  The patient notes occasional burning in the feet.  He had been on gabapentin previously but is not on gabapentin at this time.  I did resume Zyprexa on this patient 15 mg at bedtime and he has an upcoming appointment with mental health at [facility].  Glucoses have been in the 170 to as high as 300 after meals.  Denies any chest pain or dyspnea.  01/20/2020 This is a telehealth phone visit.  This patient formally was in the [place] but now has moved in with his daughter in [place].  He became ill on January 22 and was tested the same day and was Covid positive in [facility].  The patient notes wheezing cough lightheadedness and significant fatigue at this time.  He did have also some diarrhea which is slowly resolving but still present.  The patient is also lost his sense of smell.  He also has headaches as well.  Patient denies significant shortness of breath.  He is currently on his fifth day of illness and he is a candidate for monoclonal antibody infusion secondary to his diabetes and hypertension and age  He does not monitor his blood sugar and he is run out of his Lantus  02/16/2020 This is a follow-up telephone visit from a January phone visit.  This patient has history of type 2 diabetes with diabetic neuropathy ongoing tobacco use and hypertension.  Note in January he did develop Covid infection and did receive monoclonal antibody therapy on January 28.  He states he did very well with this.  He  states he has had no other symptoms from the Covid at this time.  Patient states his blood sugars have been 130-140 before meals.  He denies any alcohol use.  He now has full Medicaid.  He is in need refills on his medications.  He would like to have his meds to go to a pharmacy in [place] where he is now living.  He currently is still not have any employment.  He still complaining of significant neuropathy type pain and he awakens it night with this  05/10/2020 Patient seen by way of a telephone visit and is doing well overall blood sugars in the 130s fasting he is not drinking his smoking is down to half a pack a day he does not monitor his blood pressure at home.  He does complain of chronic neuropathy.  06/07/2020 Patient returns today for an in office exam complaining of pain in the left leg with recent emergency room visit negative for deep venous thrombosis.  He states the gabapentin has been given for this and it is not helping much.  The pain is in the left upper leg and goes down to the knee.  It feels like a cramping sensation.  Note his weight is up from previous encounters.  He states he is compliant with his diabetes regimen but he is not compliant with his diet.  He denies any shortness of breath or headaches.  He still smokes 1/2 pack a  day of cigarettes.  He is on the Lantus 40 units at bedtime 1000 mg twice daily of metformin.  He follows up with his mental health provider as well.  08/02/2020  This patient is seen in return follow-up for hypertension and poorly controlled diabetes type 2 Schizophrenia now complains of erectile dysfunction  [name] been very resistive to receiving a Covid vaccine due to prior comments will have made with this patient on this.  The patient states his blood sugars have been in the 170s to 150 range at home and he has been compliant with his insulin program.  Patient continues to smoke about 5 cigarettes daily.  There are no other new  complaints.  10/11/20 Patient comes in return follow-up and is having no complaints.  On arrival no blood pressure is elevated 174/106.  He is on amlodipine alone.  Note he has severe allergic reaction to lisinopril.  Heart rates in the 110 range  Note the patient is considering the flu vaccine and will receive it at this visit  Patient is yet to receive the Covid vaccine is pondering this  Past Medical History:  Diagnosis Date  . COVID-19 virus infection 01/20/2020  . Diabetes mellitus   . Diabetic neuropathy ([place])   . High cholesterol   . Hypertension   . Schizophrenia ([place])   . Toenail fungus 08/17/2019     Family History  Problem Relation Age of Onset  . Hypertension Mother   . Cancer Father   . Alcoholism Other      Social History   Socioeconomic History  . Marital status: Single    Spouse name: Not on file  . Number of children: Not on file  . Years of education: Not on file  . Highest education level: Not on file  Occupational History  . Not on file  Tobacco Use  . Smoking status: Current Every Day Smoker    Packs/day: 0.50    Types: Cigarettes  . Smokeless tobacco: Never Used  Vaping Use  . Vaping Use: Never used  Substance and Sexual Activity  . Alcohol use: No  . Drug use: No  . Sexual activity: Yes  Other Topics Concern  . Not on file  Social History Narrative  . Not on file   Social Determinants of Health   Financial Resource Strain:   . Difficulty of Paying Living Expenses: Not on file  Food Insecurity:   . Worried About Charity fundraiser in the Last Year: Not on file  . Ran Out of Food in the Last Year: Not on file  Transportation Needs:   . Lack of Transportation (Medical): Not on file  . Lack of Transportation (Non-Medical): Not on file  Physical Activity:   . Days of Exercise per Week: Not on file  . Minutes of Exercise per Session: Not on file  Stress:   . Feeling of Stress : Not on file  Social Connections:   . Frequency of  Communication with Friends and Family: Not on file  . Frequency of Social Gatherings with Friends and Family: Not on file  . Attends Religious Services: Not on file  . Active Member of Clubs or Organizations: Not on file  . Attends Archivist Meetings: Not on file  . Marital Status: Not on file  Intimate Partner Violence:   . Fear of Current or Ex-Partner: Not on file  . Emotionally Abused: Not on file  . Physically Abused: Not on file  . Sexually  Abused: Not on file     Allergies  Allergen Reactions  . Benadryl [Diphenhydramine Hcl]   . Diphenhydramine     GI upset, paradoxic agitation  . Haldol [Haloperidol Lactate]   . Haldol [Haloperidol]     "makes me tremble, bite my tongue"  . Lisinopril Swelling  . Thorazine [Chlorpromazine]     Tremble, bite my tongue  . Thorazine [Chlorpromazine]      Outpatient Medications Prior to Visit  Medication Sig Dispense Refill  . albuterol (PROAIR HFA) 108 (90 Base) MCG/ACT inhaler INHALE TWO PUFFS INTO THE LUNGS EVERY 4 HOURS AS NEEDED FOR WHEEZING/SOB 8.5 g 1  . amLODipine (NORVASC) 10 MG tablet Take 1 tablet (10 mg total) by mouth daily. 90 tablet 3  . aspirin EC 81 MG tablet Take 1 tablet (81 mg total) by mouth daily. 30 tablet 1  . atorvastatin (LIPITOR) 20 MG tablet Take 1 tablet (20 mg total) by mouth daily. Reported on 05/01/2016 90 tablet 3  . BD ULTRA-FINE PEN NEEDLES 29G X 12.7MM MISC USE WITH LANTUS PEN    . Blood Glucose Monitoring Suppl (TRUE METRIX METER) w/Device KIT Use as instructed to check blood sugar daily. 1 kit 0  . Dulaglutide (TRULICITY) 2.92 KM/6.2MM SOPN Inject 0.5 mLs (0.75 mg total) into the skin once a week. 2 mL 5  . DULoxetine (CYMBALTA) 60 MG capsule Take 1 capsule (60 mg total) by mouth daily. 30 capsule 3  . glucose blood (TRUE METRIX BLOOD GLUCOSE TEST) test strip Use as instructed to check blood sugar daily. 100 each 12  . hydrochlorothiazide (HYDRODIURIL) 25 MG tablet Take 1 tablet (25 mg total)  by mouth daily. 30 tablet 3  . insulin glargine (LANTUS SOLOSTAR) 100 UNIT/ML Solostar Pen Inject 50 Units into the skin at bedtime. 3 mL 11  . Insulin Pen Needle 29G X 5MM MISC Use with Lantus pen 100 each 0  . metFORMIN (GLUCOPHAGE) 1000 MG tablet Take 1 tablet (1,000 mg total) by mouth 2 (two) times daily with a meal. 180 tablet 1  . OLANZapine (ZYPREXA) 15 MG tablet TAKE ONE TABLET BY MOUTH AT BEDTIME 30 tablet 1  . tadalafil (CIALIS) 10 MG tablet Take 1 tablet (10 mg total) by mouth daily as needed for erectile dysfunction. 10 tablet 0  . TRUEplus Lancets 28G MISC Use as instructed to check blood sugar daily. 100 each 11   No facility-administered medications prior to visit.   Review of Systems  Pos In BOLD  Constitutional:  weight loss, night sweats,  Fevers, chills, fatigue, lassitude. HEENT:   headaches,  Difficulty swallowing,  Tooth/dental problems,  Sore throat,                No sneezing, itching, ear ache, nasal congestion, post nasal drip,   CV:   chest pain,  Orthopnea, PND, swelling in lower extremities, anasarca, dizziness, palpitations  GI heartburn, indigestion, abdominal pain, nausea, vomiting, diarrhea, change in bowel habits, loss of appetite  Resp: No shortness of breath with exertion or at rest.  No excess mucus, no productive cough,  No non-productive cough,  No coughing up of blood.  No change in color of mucus.  No wheezing.  No chest wall deformity  Skin: no rash or lesions.  GU: no dysuria, change in color of urine, no urgency or frequency.  No flank pain.  MS:  No joint pain or swelling.  No decreased range of motion.  No back pain.   Pain in both  lower legs neuropathic  Psych:  No change in mood or affect. No depression or anxiety.  No memory loss.     Objective:   Physical Exam   Vitals:   10/11/20 1457  BP: (!) 174/106  Pulse: (!) 105  Temp: (!) 97.2 F (36.2 C)  SpO2: 99%  Weight: 200 lb 12.8 oz (91.1 kg)  Height: 5' 11.25" (1.81 m)     Gen: Pleasant, well-nourished, in no distress,  normal affect  ENT: No lesions, abscess in the gum of the right upper jaw first molar,  oropharynx clear, no postnasal drip  Neck: No JVD, no TMG, no carotid bruits  Lungs: No use of accessory muscles, no dullness to percussion, clear without rales or rhonchi  Cardiovascular: RRR, heart sounds normal, no murmur or gallops, no peripheral edema  Abdomen: soft and NT, no HSM,  BS normal  Musculoskeletal: No deformities, no cyanosis or clubbing  Neuro: alert, non focal  Skin: Warm, no lesions or rashes  Foot exam was normal at this visit  Assessment & Plan:  I personally reviewed all images and lab data in the [facility] system as well as any outside material available during this office visit and agree with the  radiology impressions.   Essential hypertension Hypertension poorly controlled we will begin metoprolol to 50 mg daily and continue the amlodipine 10 mg daily  Controlled type 2 diabetes mellitus ([place]) Controlled diabetes type 2 continue Trulicity and Metformin as prescribed note a hemoglobin A1c is now down to the 6 range  Chronic paranoid schizophrenia ([place]) Patient scoring high on depression scale he does have mental health follow-up  Continue Zyprexa and Cymbalta  DM (diabetes mellitus), type 2 with neurological complications ([place]) Peripheral neuropathy on basis of diabetes continue the Cymbalta  Dental abscess Dental abscess seen in the right upper jaw first molar will prescribe Augmentin twice daily for 10 days he may yet require dental care     Avian was seen today for follow-up.  Diagnoses and all orders for this visit:  DM (diabetes mellitus), type 2 with neurological complications ([place]) -     Microalbumin / creatinine urine ratio  Uncontrolled type 2 diabetes mellitus with hyperglycemia (HCC) -     POCT glycosylated hemoglobin (Hb A1C) -     POCT glucose (manual entry)  Essential  hypertension  Controlled type 2 diabetes mellitus without complication, without long-term current use of insulin (HCC)  Chronic paranoid schizophrenia ([place])  Dental abscess  Other orders -     amoxicillin-clavulanate (AUGMENTIN) 875-125 MG tablet; Take 1 tablet by mouth 2 (two) times daily.  Marland Kitchen

## 2020-10-11 ENCOUNTER — Encounter: Payer: Self-pay | Admitting: Critical Care Medicine

## 2020-10-11 ENCOUNTER — Ambulatory Visit: Payer: Medicaid Other | Attending: Critical Care Medicine | Admitting: Critical Care Medicine

## 2020-10-11 ENCOUNTER — Other Ambulatory Visit: Payer: Self-pay

## 2020-10-11 VITALS — BP 174/106 | HR 105 | Temp 97.2°F | Ht 71.25 in | Wt 200.8 lb

## 2020-10-11 DIAGNOSIS — E1165 Type 2 diabetes mellitus with hyperglycemia: Secondary | ICD-10-CM

## 2020-10-11 DIAGNOSIS — K047 Periapical abscess without sinus: Secondary | ICD-10-CM

## 2020-10-11 DIAGNOSIS — I10 Essential (primary) hypertension: Secondary | ICD-10-CM

## 2020-10-11 DIAGNOSIS — E119 Type 2 diabetes mellitus without complications: Secondary | ICD-10-CM

## 2020-10-11 DIAGNOSIS — E1149 Type 2 diabetes mellitus with other diabetic neurological complication: Secondary | ICD-10-CM

## 2020-10-11 DIAGNOSIS — F200 Paranoid schizophrenia: Secondary | ICD-10-CM

## 2020-10-11 HISTORY — DX: Periapical abscess without sinus: K04.7

## 2020-10-11 LAB — POCT GLYCOSYLATED HEMOGLOBIN (HGB A1C): Hemoglobin A1C: 6.6 % — AB (ref 4.0–5.6)

## 2020-10-11 LAB — GLUCOSE, POCT (MANUAL RESULT ENTRY): POC Glucose: 128 mg/dl — AB (ref 70–99)

## 2020-10-11 MED ORDER — AMOXICILLIN-POT CLAVULANATE 875-125 MG PO TABS: 1.0000 | ORAL_TABLET | Freq: Two times a day (BID) | ORAL | 0 refills | Status: DC

## 2020-10-11 MED ORDER — METOPROLOL SUCCINATE ER 50 MG PO TB24
50.0000 mg | ORAL_TABLET | Freq: Every day | ORAL | 3 refills | Status: DC
Start: 2020-10-11 — End: 2021-01-17

## 2020-10-11 NOTE — Assessment & Plan Note (Signed)
Peripheral neuropathy on basis of diabetes continue the Cymbalta

## 2020-10-11 NOTE — Assessment & Plan Note (Signed)
Patient scoring high on depression scale he does have mental health follow-up  Continue Zyprexa and Cymbalta

## 2020-10-11 NOTE — Assessment & Plan Note (Signed)
Controlled diabetes type 2 continue Trulicity and Metformin as prescribed note a hemoglobin A1c is now down to the 6 range

## 2020-10-11 NOTE — Assessment & Plan Note (Signed)
Dental abscess seen in the right upper jaw first molar will prescribe Augmentin twice daily for 10 days he may yet require dental care

## 2020-10-11 NOTE — Patient Instructions (Signed)
Urine today for protein for diabetes  Antibiotic sent for dental abscess  Start metoprolol one daily  All other medications no change , refills sent   Return to Dr Delford Field 3 months

## 2020-10-11 NOTE — Assessment & Plan Note (Signed)
Hypertension poorly controlled we will begin metoprolol to 50 mg daily and continue the amlodipine 10 mg daily

## 2020-10-12 LAB — MICROALBUMIN / CREATININE URINE RATIO
Creatinine, Urine: 48.6 mg/dL
Microalb/Creat Ratio: 63 mg/g creat — ABNORMAL HIGH (ref 0–29)
Microalbumin, Urine: 30.7 ug/mL

## 2020-11-07 ENCOUNTER — Other Ambulatory Visit: Payer: Self-pay | Admitting: Critical Care Medicine

## 2020-11-07 NOTE — Telephone Encounter (Signed)
Copied from CRM [id number]. Topic: Quick Communication - Rx Refill/Question >> Nov 07, 2020  9:52 AM Marylen Ponto wrote: Medication: amLODipine (NORVASC) 10 MG tablet  Has the patient contacted their pharmacy? yes   Preferred Pharmacy (with phone number or street name): [facility] [place], [place] - [address]  Phone: [phone]  Fax: [phone]  Agent: Please be advised that RX refills may take up to 3 business days. We ask that you follow-up with your pharmacy.

## 2020-11-09 ENCOUNTER — Other Ambulatory Visit: Payer: Self-pay | Admitting: Critical Care Medicine

## 2020-11-24 ENCOUNTER — Other Ambulatory Visit: Payer: Self-pay | Admitting: Critical Care Medicine

## 2020-11-24 DIAGNOSIS — E1100 Type 2 diabetes mellitus with hyperosmolarity without nonketotic hyperglycemic-hyperosmolar coma (NKHHC): Secondary | ICD-10-CM

## 2020-11-24 MED ORDER — TRULICITY 0.75 MG/0.5ML ~~LOC~~ SOAJ: 0.7500 mg | SUBCUTANEOUS | 1 refills | Status: DC

## 2020-11-24 MED ORDER — HYDROCHLOROTHIAZIDE 25 MG PO TABS: 25.0000 mg | ORAL_TABLET | Freq: Every day | ORAL | 1 refills | Status: DC

## 2020-11-24 MED ORDER — BD PEN NEEDLE ORIGINAL U/F 29G X 12.7MM MISC
1 refills | Status: DC
Start: 2020-11-24 — End: 2021-01-06

## 2020-11-24 MED ORDER — AMLODIPINE BESYLATE 10 MG PO TABS: 10.0000 mg | ORAL_TABLET | Freq: Every day | ORAL | 0 refills | Status: DC

## 2020-11-24 MED ORDER — TRUE METRIX BLOOD GLUCOSE TEST VI STRP: ORAL_STRIP | 12 refills | Status: DC

## 2020-11-24 NOTE — Telephone Encounter (Signed)
Pt request refill  hydrochlorothiazide (HYDRODIURIL) 25 MG tablet  Lancets and strips for the true metrix meter  BD ULTRA-FINE PEN NEEDLES 31G X3/16 in  (0.9mmX5mm)  Please Transfer  amLODipine (NORVASC) 10 MG tablet  Dulaglutide (TRULICITY) 0.75 MG/0.5ML SOPN  [facility] - [place], [place] - [address] Phone:  [phone]  Fax:  [phone]      because they will deliver

## 2020-11-28 ENCOUNTER — Other Ambulatory Visit: Payer: Self-pay | Admitting: Critical Care Medicine

## 2020-11-28 NOTE — Telephone Encounter (Signed)
  Notes to clinic:  medication not on current medication list Please advise for refill    Requested Prescriptions  Pending Prescriptions Disp Refills   gabapentin (NEURONTIN) 300 MG capsule [Pharmacy Med Name: GABAPENTIN 300MG  CAP] 360 capsule 0    Sig: TAKE TWO CAPSULES BY MOUTH THREE TIMES A DAY      Neurology: Anticonvulsants - gabapentin Passed - 11/28/2020  2:04 PM      Passed - Valid encounter within last 12 months    Recent Outpatient Visits           1 month ago Controlled type 2 diabetes mellitus without complication, without long-term current use of insulin (HCC)   [facility] 14/05/2020, MD   3 months ago DM (diabetes mellitus), type 2 with neurological complications [facility])   [place] [facility], MD   5 months ago DM (diabetes mellitus), type 2 with neurological complications [facility])   [place] [facility], MD   6 months ago Essential hypertension   [place] [facility], MD   9 months ago Uncontrolled type 2 diabetes mellitus with hyperglycemia [facility])   [place] [facility], MD       Future Appointments             In 1 month Storm Frisk Delford Field, MD [facility]

## 2020-12-09 ENCOUNTER — Other Ambulatory Visit: Payer: Self-pay | Admitting: Critical Care Medicine

## 2020-12-09 MED ORDER — TRULICITY 0.75 MG/0.5ML ~~LOC~~ SOAJ: 0.7500 mg | SUBCUTANEOUS | 1 refills | Status: DC

## 2020-12-09 NOTE — Telephone Encounter (Signed)
Medication Refill - Medication:  Dulaglutide (TRULICITY) 0.75 MG/0.5ML SOPN  Has the patient contacted their pharmacy? Yes.   (Agent: If no, request that the patient contact the pharmacy for the refill.) (Agent: If yes, when and what did the pharmacy advise?)  Preferred Pharmacy (with phone number or street name):  [facility] - [place], [place] - [address]  [address] [place] [address]  Phone: [phone] Fax: [phone]     Agent: Please be advised that RX refills may take up to 3 business days. We ask that you follow-up with your pharmacy.

## 2020-12-27 ENCOUNTER — Other Ambulatory Visit: Payer: Self-pay | Admitting: Critical Care Medicine

## 2020-12-27 NOTE — Telephone Encounter (Signed)
Future visit in 3 weeks °

## 2021-01-06 ENCOUNTER — Other Ambulatory Visit: Payer: Self-pay | Admitting: Critical Care Medicine

## 2021-01-06 ENCOUNTER — Telehealth: Payer: Self-pay | Admitting: Critical Care Medicine

## 2021-01-06 MED ORDER — BD PEN NEEDLE ORIGINAL U/F 29G X 12.7MM MISC
1 refills | Status: DC
Start: 2021-01-06 — End: 2021-01-10

## 2021-01-06 MED ORDER — OLANZAPINE 15 MG PO TABS
15.0000 mg | ORAL_TABLET | Freq: Every day | ORAL | 0 refills | Status: DC
Start: 2021-01-06 — End: 2021-01-17

## 2021-01-06 MED ORDER — DULOXETINE HCL 60 MG PO CPEP
60.0000 mg | ORAL_CAPSULE | Freq: Every day | ORAL | 0 refills | Status: DC
Start: 2021-01-06 — End: 2021-01-17

## 2021-01-06 NOTE — Telephone Encounter (Signed)
Medication Refill - Medication: Insulin pen needles, Zyprexa, Cymbalta   Has the patient contacted their pharmacy? Yes.   Pt states that he was advised to contact PCP. He states that he is out of his pen needles and is requesting to have those sent in today. Please advise. (Agent: If no, request that the patient contact the pharmacy for the refill.) (Agent: If yes, when and what did the pharmacy advise?)  Preferred Pharmacy (with phone number or street name):  [id number] [place] [facility], [place] - [address]  [address]  [place] [address]  Phone: [phone] Fax: [phone]  Hours: Not open 24 hours     Agent: Please be advised that RX refills may take up to 3 business days. We ask that you follow-up with your pharmacy.

## 2021-01-06 NOTE — Telephone Encounter (Signed)
Notes to clinic:  medication [facility] to be [facility] [place]   Requested Prescriptions  Pending Prescriptions Disp Refills   OLANZapine (ZYPREXA) 15 MG tablet 30 tablet 2    Sig: Take 1 tablet (15 mg total) by mouth at bedtime.      Not Delegated - Psychiatry:  Antipsychotics - Second Generation (Atypical) - olanzapine Failed - 01/06/2021  1:58 PM      Failed - This refill cannot be delegated      Failed - Last BP in normal range    BP Readings from Last 1 Encounters:  10/11/20 (!) 174/106          Passed - ALT in normal range and within 360 days    ALT  Date Value Ref Range Status  08/02/2020 21 0 - 44 IU/L Final          Passed - AST in normal range and within 360 days    AST  Date Value Ref Range Status  08/02/2020 23 0 - 40 IU/L Final          Passed - Valid encounter within last 6 months    Recent Outpatient Visits           2 months ago Controlled type 2 diabetes mellitus without complication, without long-term current use of insulin (HCC)   [place] Storm Frisk, MD   5 months ago DM (diabetes mellitus), type 2 with neurological complications [facility])   [place] [facility] Storm Frisk, MD   7 months ago DM (diabetes mellitus), type 2 with neurological complications [facility])   [place] [facility] Storm Frisk, MD   8 months ago Essential hypertension   [place] [facility] Storm Frisk, MD   10 months ago Uncontrolled type 2 diabetes mellitus with hyperglycemia [facility])   [place] [facility] Storm Frisk, MD       Future Appointments             In 1 week Storm Frisk, MD [facility]              Signed Prescriptions Disp Refills   DULoxetine (CYMBALTA) 60 MG capsule 90 capsule 0    Sig: Take 1 capsule (60 mg total) by mouth daily.      Psychiatry:  Antidepressants - SNRI Failed - 01/06/2021  1:58 PM      Failed - Last BP in normal range    BP Readings from Last 1 Encounters:  10/11/20 (!) 174/106          Passed - Valid encounter within last 6 months    Recent Outpatient Visits           2 months ago Controlled type 2 diabetes mellitus without complication, without long-term current use of insulin (HCC)   [place] [facility] Storm Frisk, MD   5 months ago DM (diabetes mellitus), type 2 with neurological complications [facility])   [place] [facility] Storm Frisk, MD   7 months ago DM (diabetes mellitus), type 2 with neurological complications [facility])   [place] [facility] Storm Frisk, MD   8 months ago Essential hypertension   [facility] Storm Frisk, MD   10 months ago Uncontrolled type 2 diabetes mellitus  with hyperglycemia [facility])   [place] [facility] Storm Frisk, MD       Future Appointments             In 1 week Delford Field Charlcie Cradle, MD [facility]               BD ULTRA-FINE PEN NEEDLES 29G X 12.7MM MISC 100 each 1    Sig: USE WITH LANTUS PEN      Endocrinology: Diabetes - Testing Supplies Passed - 01/06/2021  1:58 PM      Passed - Valid encounter within last 12 months    Recent Outpatient Visits           2 months ago Controlled type 2 diabetes mellitus without complication, without long-term current use of insulin (HCC)   [place] [facility] Storm Frisk, MD   5 months ago DM (diabetes mellitus), type 2 with neurological complications [facility])   [place] [facility] Storm Frisk, MD   7 months ago DM (diabetes mellitus), type 2 with neurological complications [facility])   [place] [facility] Storm Frisk, MD   8 months ago Essential hypertension   [facility] Storm Frisk, MD   10 months ago Uncontrolled type 2 diabetes mellitus with hyperglycemia [facility])   [place] [facility] Storm Frisk, MD       Future Appointments             In 1 week Storm Frisk, MD [facility]

## 2021-01-10 ENCOUNTER — Other Ambulatory Visit: Payer: Self-pay | Admitting: *Deleted

## 2021-01-10 MED ORDER — BD PEN NEEDLE ORIGINAL U/F 29G X 12.7MM MISC: 1 refills | Status: DC

## 2021-01-10 NOTE — Telephone Encounter (Signed)
[facility]  Pharmacy stated they did not have on file and requested to be resent.

## 2021-01-10 NOTE — Telephone Encounter (Signed)
Pt called saying he called the pharmacy and they told him we did not send the rx for his insulin pins  He ask that we check on this for him.  It appears that the pharmacy did rec the rx request.

## 2021-01-17 ENCOUNTER — Ambulatory Visit: Payer: Medicaid Other | Attending: Critical Care Medicine | Admitting: Critical Care Medicine

## 2021-01-17 ENCOUNTER — Other Ambulatory Visit: Payer: Self-pay

## 2021-01-17 ENCOUNTER — Encounter: Payer: Self-pay | Admitting: Critical Care Medicine

## 2021-01-17 VITALS — BP 178/104 | HR 104 | Resp 16 | Wt 200.0 lb

## 2021-01-17 DIAGNOSIS — Z7982 Long term (current) use of aspirin: Secondary | ICD-10-CM | POA: Insufficient documentation

## 2021-01-17 DIAGNOSIS — E1149 Type 2 diabetes mellitus with other diabetic neurological complication: Secondary | ICD-10-CM

## 2021-01-17 DIAGNOSIS — I10 Essential (primary) hypertension: Secondary | ICD-10-CM

## 2021-01-17 DIAGNOSIS — Z7901 Long term (current) use of anticoagulants: Secondary | ICD-10-CM | POA: Insufficient documentation

## 2021-01-17 DIAGNOSIS — Z87891 Personal history of nicotine dependence: Secondary | ICD-10-CM | POA: Insufficient documentation

## 2021-01-17 DIAGNOSIS — Z1211 Encounter for screening for malignant neoplasm of colon: Secondary | ICD-10-CM

## 2021-01-17 DIAGNOSIS — F17210 Nicotine dependence, cigarettes, uncomplicated: Secondary | ICD-10-CM | POA: Insufficient documentation

## 2021-01-17 DIAGNOSIS — Z720 Tobacco use: Secondary | ICD-10-CM

## 2021-01-17 DIAGNOSIS — F200 Paranoid schizophrenia: Secondary | ICD-10-CM | POA: Diagnosis not present

## 2021-01-17 DIAGNOSIS — Z79899 Other long term (current) drug therapy: Secondary | ICD-10-CM | POA: Diagnosis not present

## 2021-01-17 DIAGNOSIS — E119 Type 2 diabetes mellitus without complications: Secondary | ICD-10-CM

## 2021-01-17 DIAGNOSIS — Z760 Encounter for issue of repeat prescription: Secondary | ICD-10-CM | POA: Diagnosis not present

## 2021-01-17 DIAGNOSIS — Z794 Long term (current) use of insulin: Secondary | ICD-10-CM | POA: Diagnosis not present

## 2021-01-17 LAB — POCT GLYCOSYLATED HEMOGLOBIN (HGB A1C): HbA1c, POC (controlled diabetic range): 6.6 % (ref 0.0–7.0)

## 2021-01-17 LAB — POCT CBG (FASTING - GLUCOSE)-MANUAL ENTRY: Glucose Fasting, POC: 106 mg/dL — AB (ref 70–99)

## 2021-01-17 MED ORDER — DULOXETINE HCL 60 MG PO CPEP: 60.0000 mg | ORAL_CAPSULE | Freq: Every day | ORAL | 0 refills | Status: DC

## 2021-01-17 MED ORDER — AMLODIPINE BESYLATE 10 MG PO TABS: 10.0000 mg | ORAL_TABLET | Freq: Every day | ORAL | 0 refills | Status: DC

## 2021-01-17 MED ORDER — LANTUS SOLOSTAR 100 UNIT/ML ~~LOC~~ SOPN: 50.0000 [IU] | PEN_INJECTOR | Freq: Every day | SUBCUTANEOUS | 11 refills | Status: DC

## 2021-01-17 MED ORDER — OLANZAPINE 15 MG PO TABS: 15.0000 mg | ORAL_TABLET | Freq: Every day | ORAL | 0 refills | Status: DC

## 2021-01-17 MED ORDER — TRULICITY 0.75 MG/0.5ML ~~LOC~~ SOAJ
0.7500 mg | SUBCUTANEOUS | 1 refills | Status: DC
Start: 2021-01-17 — End: 2021-01-31

## 2021-01-17 MED ORDER — ALBUTEROL SULFATE HFA 108 (90 BASE) MCG/ACT IN AERS: INHALATION_SPRAY | RESPIRATORY_TRACT | 1 refills | Status: DC

## 2021-01-17 MED ORDER — METFORMIN HCL 1000 MG PO TABS: 1000.0000 mg | ORAL_TABLET | Freq: Two times a day (BID) | ORAL | 1 refills | Status: DC

## 2021-01-17 MED ORDER — TADALAFIL 10 MG PO TABS: 10.0000 mg | ORAL_TABLET | Freq: Every day | ORAL | 0 refills | Status: DC | PRN

## 2021-01-17 MED ORDER — CARVEDILOL 25 MG PO TABS: 25.0000 mg | ORAL_TABLET | Freq: Two times a day (BID) | ORAL | 3 refills | Status: DC

## 2021-01-17 MED ORDER — VALSARTAN-HYDROCHLOROTHIAZIDE 320-25 MG PO TABS: 1.0000 | ORAL_TABLET | Freq: Every day | ORAL | 1 refills | Status: DC

## 2021-01-17 MED ORDER — ATORVASTATIN CALCIUM 20 MG PO TABS: 20.0000 mg | ORAL_TABLET | Freq: Every day | ORAL | 3 refills | Status: DC

## 2021-01-17 NOTE — Assessment & Plan Note (Signed)
  .   Current smoking consumption amount: 1 pack a day  . Dicsussion on advise to quit smoking and smoking impacts: Impacts of cardiovascular health  . Patient's willingness to quit: Not ready quit  . Methods to quit smoking discussed: Behavioral modification  . Medication management of smoking session drugs discussed: None  . Resources provided:  AVS   . Setting quit date not established  . Follow-up arranged 3 months   Time spent counseling the patient: 5 minutes .Marland KitchenMarland Kitchen

## 2021-01-17 NOTE — Assessment & Plan Note (Signed)
Controlled paranoid schizophrenia continue Zyprexa

## 2021-01-17 NOTE — Progress Notes (Signed)
Subjective:    Patient ID: [name], male    DOB: 08/08/1961, 60 y.o.   MRN: [id number]  History of Present Illness:  08/17/19 This is a 60 year old male seen recently in the [place] [facility] and was released from prison recently has history of hypertension type 2 diabetes and bipolar disorder.  The patient has had now given access to Lantus and was taking 35 units at bedtime patient also maintains atorvastatin, hydrochlorthiazide,Metformin, and amlodipine.  The patient notes occasional burning in the feet.  He had been on gabapentin previously but is not on gabapentin at this time.  I did resume Zyprexa on this patient 15 mg at bedtime and he has an upcoming appointment with mental health at [facility].  Glucoses have been in the 170 to as high as 300 after meals.  Denies any chest pain or dyspnea.  01/20/2020 This is a telehealth phone visit.  This patient formally was in the [place] but now has moved in with his daughter in [place].  He became ill on January 22 and was tested the same day and was Covid positive in [facility].  The patient notes wheezing cough lightheadedness and significant fatigue at this time.  He did have also some diarrhea which is slowly resolving but still present.  The patient is also lost his sense of smell.  He also has headaches as well.  Patient denies significant shortness of breath.  He is currently on his fifth day of illness and he is a candidate for monoclonal antibody infusion secondary to his diabetes and hypertension and age  He does not monitor his blood sugar and he is run out of his Lantus  02/16/2020 This is a follow-up telephone visit from a January phone visit.  This patient has history of type 2 diabetes with diabetic neuropathy ongoing tobacco use and hypertension.  Note in January he did develop Covid infection and did receive monoclonal antibody therapy on January 28.  He states he did very well with this.  He  states he has had no other symptoms from the Covid at this time.  Patient states his blood sugars have been 130-140 before meals.  He denies any alcohol use.  He now has full Medicaid.  He is in need refills on his medications.  He would like to have his meds to go to a pharmacy in [place] where he is now living.  He currently is still not have any employment.  He still complaining of significant neuropathy type pain and he awakens it night with this  05/10/2020 Patient seen by way of a telephone visit and is doing well overall blood sugars in the 130s fasting he is not drinking his smoking is down to half a pack a day he does not monitor his blood pressure at home.  He does complain of chronic neuropathy.  06/07/2020 Patient returns today for an in office exam complaining of pain in the left leg with recent emergency room visit negative for deep venous thrombosis.  He states the gabapentin has been given for this and it is not helping much.  The pain is in the left upper leg and goes down to the knee.  It feels like a cramping sensation.  Note his weight is up from previous encounters.  He states he is compliant with his diabetes regimen but he is not compliant with his diet.  He denies any shortness of breath or headaches.  He still smokes 1/2 pack a  day of cigarettes.  He is on the Lantus 40 units at bedtime 1000 mg twice daily of metformin.  He follows up with his mental health provider as well.  08/02/2020  This patient is seen in return follow-up for hypertension and poorly controlled diabetes type 2 Schizophrenia now complains of erectile dysfunction  [name] been very resistive to receiving a Covid vaccine due to prior comments will have made with this patient on this.  The patient states his blood sugars have been in the 170s to 150 range at home and he has been compliant with his insulin program.  Patient continues to smoke about 5 cigarettes daily.  There are no other new  complaints.  10/11/20 Patient comes in return follow-up and is having no complaints.  On arrival no blood pressure is elevated 174/106.  He is on amlodipine alone.  Note he has severe allergic reaction to lisinopril.  Heart rates in the 110 range  Note the patient is considering the flu vaccine and will receive it at this visit  Patient is yet to receive the Covid vaccine is pondering this  01/17/21 Patient is seen in return follow-up for hypertension type 2 diabetes.  On arrival hemoglobin A1c was 6.6 blood sugar 104 however blood pressure is 178/104.  The patient had been missing some doses of metoprolol and amlodipine which are his only 2 blood pressure medications.  Patient still smoking a pack a day of cigarettes and still remains under a great deal of stress as he staying at his daughter's home. Patient declined to get a Covid vaccine it is a religious exemption issue as he is in [place] Patient has been compliant with Zyprexa and this is helped his mental health.  The dental abscess he had at the last visit has resolved but he still has poor dentition  Past Medical History:  Diagnosis Date  . COVID-19 virus infection 01/20/2020  . Dental abscess 10/11/2020  . Diabetes mellitus   . Diabetic neuropathy ([place])   . High cholesterol   . Hypertension   . Schizophrenia ([place])   . Toenail fungus 08/17/2019     Family History  Problem Relation Age of Onset  . Hypertension Mother   . Cancer Father   . Alcoholism Other      Social History   Socioeconomic History  . Marital status: Single    Spouse name: Not on file  . Number of children: Not on file  . Years of education: Not on file  . Highest education level: Not on file  Occupational History  . Not on file  Tobacco Use  . Smoking status: Current Every Day Smoker    Packs/day: 0.50    Types: Cigarettes  . Smokeless tobacco: Never Used  Vaping Use  . Vaping Use: Never used  Substance and Sexual Activity  . Alcohol use:  No  . Drug use: No  . Sexual activity: Yes  Other Topics Concern  . Not on file  Social History Narrative  . Not on file   Social Determinants of Health   Financial Resource Strain: Not on file  Food Insecurity: Not on file  Transportation Needs: Not on file  Physical Activity: Not on file  Stress: Not on file  Social Connections: Not on file  Intimate Partner Violence: Not on file     Allergies  Allergen Reactions  . Benadryl [Diphenhydramine Hcl]   . Diphenhydramine     GI upset, paradoxic agitation  . Haldol [Haloperidol Lactate]   .  Haldol [Haloperidol]     "makes me tremble, bite my tongue"  . Lisinopril Swelling  . Thorazine [Chlorpromazine]     Tremble, bite my tongue  . Thorazine [Chlorpromazine]      Outpatient Medications Prior to Visit  Medication Sig Dispense Refill  . aspirin EC 81 MG tablet Take 1 tablet (81 mg total) by mouth daily. 30 tablet 1  . BD ULTRA-FINE PEN NEEDLES 29G X 12.7MM MISC USE WITH LANTUS PEN 100 each 1  . Blood Glucose Monitoring Suppl (TRUE METRIX METER) w/Device KIT Use as instructed to check blood sugar daily. 1 kit 0  . glucose blood (TRUE METRIX BLOOD GLUCOSE TEST) test strip Use as instructed to check blood sugar daily. 100 each 12  . TRUEplus Lancets 28G MISC Use as instructed to check blood sugar daily. 100 each 11  . albuterol (PROAIR HFA) 108 (90 Base) MCG/ACT inhaler INHALE TWO PUFFS INTO THE LUNGS EVERY 4 HOURS AS NEEDED FOR WHEEZING/SOB 8.5 g 1  . amLODipine (NORVASC) 10 MG tablet Take 1 tablet (10 mg total) by mouth daily. 90 tablet 0  . atorvastatin (LIPITOR) 20 MG tablet Take 1 tablet (20 mg total) by mouth daily. Reported on 05/01/2016 90 tablet 3  . Dulaglutide (TRULICITY) 8.93 YB/0.1BP SOPN Inject 0.75 mg into the skin once a week. 2 mL 1  . DULoxetine (CYMBALTA) 60 MG capsule Take 1 capsule (60 mg total) by mouth daily. 90 capsule 0  . hydrochlorothiazide (HYDRODIURIL) 25 MG tablet Take 1 tablet (25 mg total) by mouth  daily. 30 tablet 1  . insulin glargine (LANTUS SOLOSTAR) 100 UNIT/ML Solostar Pen Inject 50 Units into the skin at bedtime. 3 mL 11  . metFORMIN (GLUCOPHAGE) 1000 MG tablet Take 1 tablet (1,000 mg total) by mouth 2 (two) times daily with a meal. 180 tablet 1  . metoprolol succinate (TOPROL-XL) 50 MG 24 hr tablet Take 1 tablet (50 mg total) by mouth daily. Take with or immediately following a meal. 90 tablet 3  . OLANZapine (ZYPREXA) 15 MG tablet Take 1 tablet (15 mg total) by mouth at bedtime. 30 tablet 0  . amoxicillin-clavulanate (AUGMENTIN) 875-125 MG tablet Take 1 tablet by mouth 2 (two) times daily. 20 tablet 0  . gabapentin (NEURONTIN) 300 MG capsule TAKE TWO CAPSULES BY MOUTH THREE TIMES A DAY (Patient not taking: Reported on 01/17/2021) 360 capsule 0  . tadalafil (CIALIS) 10 MG tablet Take 1 tablet (10 mg total) by mouth daily as needed for erectile dysfunction. (Patient not taking: Reported on 01/17/2021) 10 tablet 0   No facility-administered medications prior to visit.   Review of Systems  Pos In BOLD  Constitutional:  weight loss, night sweats,  Fevers, chills, fatigue, lassitude. HEENT:   headaches,  Difficulty swallowing,  Tooth/dental problems,  Sore throat,                No sneezing, itching, ear ache, nasal congestion, post nasal drip,   CV:   chest pain,  Orthopnea, PND, swelling in lower extremities, anasarca, dizziness, palpitations  GI heartburn, indigestion, abdominal pain, nausea, vomiting, diarrhea, change in bowel habits, loss of appetite  Resp: No shortness of breath with exertion or at rest.  No excess mucus, no productive cough,  No non-productive cough,  No coughing up of blood.  No change in color of mucus.  No wheezing.  No chest wall deformity  Skin: no rash or lesions.  GU: no dysuria, change in color of urine, no urgency or frequency.  No flank pain.  MS:  No joint pain or swelling.  No decreased range of motion.  No back pain.   Pain in both lower legs  neuropathic  Psych:  No change in mood or affect. No depression or anxiety.  No memory loss.     Objective:   Physical Exam   Vitals:   01/17/21 1410  BP: (!) 178/104  Pulse: (!) 104  Resp: 16  SpO2: 96%  Weight: 200 lb (90.7 kg)    Gen: Pleasant, well-nourished, in no distress,  normal affect  ENT: No lesions, abscess in the gum of the right upper jaw first molar has resolved but poor dentition in the molar areas,  oropharynx clear, no postnasal drip  Neck: No JVD, no TMG, no carotid bruits  Lungs: No use of accessory muscles, no dullness to percussion, clear without rales or rhonchi  Cardiovascular: RRR, heart sounds normal, no murmur or gallops, no peripheral edema  Abdomen: soft and NT, no HSM,  BS normal  Musculoskeletal: No deformities, no cyanosis or clubbing  Neuro: alert, non focal  Skin: Warm, no lesions or rashes   Assessment & Plan:  I personally reviewed all images and lab data in the [facility] system as well as any outside material available during this office visit and agree with the  radiology impressions.   Essential hypertension Hypertension not well controlled plan will be to continue amlodipine 10 mg daily discontinue metoprolol begin Coreg 25 mg twice daily  Patient is encouraged to focus again on smoking cessation  Patient to begin valsartan HCT 320/25 daily  Chronic paranoid schizophrenia ([place]) Controlled paranoid schizophrenia continue Zyprexa  DM (diabetes mellitus), type 2 with neurological complications (HCC) Neuropathy controlled currently with duloxetine  Controlled type 2 diabetes mellitus ([place]) Plan to continue Lantus 50 units daily Trulicity 75 mcg weekly and Metformin at 1000 mg twice daily currently under good control  Tobacco use    . Current smoking consumption amount: 1 pack a day  . Dicsussion on advise to quit smoking and smoking impacts: Impacts of cardiovascular health  . Patient's willingness to quit: Not ready  quit  . Methods to quit smoking discussed: Behavioral modification  . Medication management of smoking session drugs discussed: None  . Resources provided:  AVS   . Setting quit date not established  . Follow-up arranged 3 months   Time spent counseling the patient: 5 minutes ...   Diagnoses and all orders for this visit:  DM (diabetes mellitus), type 2 with neurological complications ([place]) -     Glucose (CBG), Fasting -     HgB A1c -     Comprehensive metabolic panel -     Lipid panel  Essential hypertension -     CBC with Differential/Platelet; Future -     CBC with Differential/Platelet  Colon cancer screening -     Fecal occult blood, imunochemical  Chronic paranoid schizophrenia ([place])  Controlled type 2 diabetes mellitus without complication, without long-term current use of insulin (HCC)  Tobacco use  Other orders -     DULoxetine (CYMBALTA) 60 MG capsule; Take 1 capsule (60 mg total) by mouth daily. -     insulin glargine (LANTUS SOLOSTAR) 100 UNIT/ML Solostar Pen; Inject 50 Units into the skin at bedtime. -     atorvastatin (LIPITOR) 20 MG tablet; Take 1 tablet (20 mg total) by mouth daily. Reported on 05/01/2016 -     OLANZapine (ZYPREXA) 15 MG tablet; Take 1 tablet (15 mg  total) by mouth at bedtime. -     tadalafil (CIALIS) 10 MG tablet; Take 1 tablet (10 mg total) by mouth daily as needed for erectile dysfunction. -     amLODipine (NORVASC) 10 MG tablet; Take 1 tablet (10 mg total) by mouth daily. -     metFORMIN (GLUCOPHAGE) 1000 MG tablet; Take 1 tablet (1,000 mg total) by mouth 2 (two) times daily with a meal. -     Dulaglutide (TRULICITY) 2.10 ZX/2.8FV SOPN; Inject 0.75 mg into the skin once a week. -     albuterol (PROAIR HFA) 108 (90 Base) MCG/ACT inhaler; INHALE TWO PUFFS INTO THE LUNGS EVERY 4 HOURS AS NEEDED FOR WHEEZING/SOB -     valsartan-hydrochlorothiazide (DIOVAN-HCT) 320-25 MG tablet; Take 1 tablet by mouth daily. -     carvedilol (COREG) 25 MG  tablet; Take 1 tablet (25 mg total) by mouth 2 (two) times daily with a meal.  .  We will give colon cancer screening kit

## 2021-01-17 NOTE — Assessment & Plan Note (Signed)
Neuropathy controlled currently with duloxetine

## 2021-01-17 NOTE — Patient Instructions (Signed)
Refills on all your medicines sent to your [facility] pharmacy  Stop metoprolol, stop hydrochlorothiazide  Begin valsartan HCT 1 daily Begin Coreg 1 twice daily Both of the above are blood pressure medicines  No other medication changes  You declined the flu vaccine and Covid vaccine on religious reasons  Labs today include metabolic panel blood count lipid panel  Return to see Dr. Delford Field 6 weeks

## 2021-01-17 NOTE — Assessment & Plan Note (Signed)
Plan to continue Lantus 50 units daily Trulicity 75 mcg weekly and Metformin at 1000 mg twice daily currently under good control

## 2021-01-17 NOTE — Assessment & Plan Note (Signed)
Hypertension not well controlled plan will be to continue amlodipine 10 mg daily discontinue metoprolol begin Coreg 25 mg twice daily  Patient is encouraged to focus again on smoking cessation  Patient to begin valsartan HCT 320/25 daily

## 2021-01-17 NOTE — Progress Notes (Signed)
3 month f/u DM

## 2021-01-18 ENCOUNTER — Encounter: Payer: Self-pay | Admitting: *Deleted

## 2021-01-18 LAB — COMPREHENSIVE METABOLIC PANEL
ALT: 21 IU/L (ref 0–44)
AST: 22 IU/L (ref 0–40)
Albumin/Globulin Ratio: 1.5 (ref 1.2–2.2)
Albumin: 4.7 g/dL (ref 3.8–4.9)
Alkaline Phosphatase: 83 IU/L (ref 44–121)
BUN/Creatinine Ratio: 14 (ref 9–20)
BUN: 18 mg/dL (ref 6–24)
Bilirubin Total: 0.3 mg/dL (ref 0.0–1.2)
CO2: 24 mmol/L (ref 20–29)
Calcium: 9.5 mg/dL (ref 8.7–10.2)
Chloride: 100 mmol/L (ref 96–106)
Creatinine, Ser: 1.28 mg/dL — ABNORMAL HIGH (ref 0.76–1.27)
GFR calc Af Amer: 70 mL/min/{1.73_m2} (ref >59)
GFR calc non Af Amer: 61 mL/min/{1.73_m2} (ref >59)
Globulin, Total: 3.2 g/dL (ref 1.5–4.5)
Glucose: 95 mg/dL (ref 65–99)
Potassium: 3.9 mmol/L (ref 3.5–5.2)
Sodium: 138 mmol/L (ref 134–144)
Total Protein: 7.9 g/dL (ref 6.0–8.5)

## 2021-01-18 LAB — CBC WITH DIFFERENTIAL/PLATELET
Basophils Absolute: 0 10*3/uL (ref 0.0–0.2)
Basos: 0 %
EOS (ABSOLUTE): 0.4 10*3/uL (ref 0.0–0.4)
Eos: 4 %
Hematocrit: 45.4 % (ref 37.5–51.0)
Hemoglobin: 15 g/dL (ref 13.0–17.7)
Immature Grans (Abs): 0.1 10*3/uL (ref 0.0–0.1)
Immature Granulocytes: 1 %
Lymphocytes Absolute: 1.7 10*3/uL (ref 0.7–3.1)
Lymphs: 16 %
MCH: 28.2 pg (ref 26.6–33.0)
MCHC: 33 g/dL (ref 31.5–35.7)
MCV: 86 fL (ref 79–97)
Monocytes Absolute: 0.7 10*3/uL (ref 0.1–0.9)
Monocytes: 7 %
Neutrophils Absolute: 7.6 10*3/uL — ABNORMAL HIGH (ref 1.4–7.0)
Neutrophils: 72 %
Platelets: 194 10*3/uL (ref 150–450)
RBC: 5.31 x10E6/uL (ref 4.14–5.80)
RDW: 14.3 % (ref 11.6–15.4)
WBC: 10.4 10*3/uL (ref 3.4–10.8)

## 2021-01-18 LAB — LIPID PANEL
Chol/HDL Ratio: 2.7 ratio (ref 0.0–5.0)
Cholesterol, Total: 112 mg/dL (ref 100–199)
HDL: 42 mg/dL (ref >39)
LDL Chol Calc (NIH): 56 mg/dL (ref 0–99)
Triglycerides: 67 mg/dL (ref 0–149)
VLDL Cholesterol Cal: 14 mg/dL (ref 5–40)

## 2021-01-31 ENCOUNTER — Other Ambulatory Visit: Payer: Self-pay | Admitting: Critical Care Medicine

## 2021-01-31 MED ORDER — TRULICITY 0.75 MG/0.5ML ~~LOC~~ SOAJ: 0.7500 mg | SUBCUTANEOUS | 1 refills | Status: DC

## 2021-01-31 NOTE — Telephone Encounter (Signed)
Requested Prescriptions  Pending Prescriptions Disp Refills  . Dulaglutide (TRULICITY) 0.75 MG/0.5ML SOPN 2 mL 1    Sig: Inject 0.75 mg into the skin once a week.     Endocrinology:  Diabetes - GLP-1 Receptor Agonists Passed - 01/31/2021  7:05 PM      Passed - HBA1C is between 0 and 7.9 and within 180 days    HbA1c, POC (controlled diabetic range)  Date Value Ref Range Status  01/17/2021 6.6 0.0 - 7.0 % Final         Passed - Valid encounter within last 6 months    Recent Outpatient Visits          2 weeks ago DM (diabetes mellitus), type 2 with neurological complications [facility])   [facility] Storm Frisk, MD   3 months ago Controlled type 2 diabetes mellitus without complication, without long-term current use of insulin (HCC)   [facility] Storm Frisk, MD   6 months ago DM (diabetes mellitus), type 2 with neurological complications [facility])   [facility] Storm Frisk, MD   7 months ago DM (diabetes mellitus), type 2 with neurological complications [facility])   [facility] Storm Frisk, MD   8 months ago Essential hypertension   [facility] Storm Frisk, MD      Future Appointments            In 1 month Delford Field Charlcie Cradle, MD [facility]

## 2021-01-31 NOTE — Telephone Encounter (Signed)
Copied from CRM [id number]. Topic: Quick Communication - Rx Refill/Question >> Jan 31, 2021  4:34 PM Gaetana Michaelis A wrote: Medication: Dulaglutide (TRULICITY) 0.75 MG/0.5ML SOPN   Has the patient contacted their pharmacy? No. Patient has not contacted pharmacy  Preferred Pharmacy (with phone number or street name): [facility] - [place], [place] - [address]  Phone:  [phone]  Agent: Please be advised that RX refills may take up to 3 business days. We ask that you follow-up with your pharmacy.

## 2021-02-21 ENCOUNTER — Telehealth: Payer: Self-pay | Admitting: Critical Care Medicine

## 2021-02-21 NOTE — Telephone Encounter (Signed)
Copied from CRM [id number]. Topic: General - Other >> Feb 20, 2021  3:39 PM Gaetana Michaelis A wrote: Reason for CRM: Patient's granddaughter has called to notify practice of an incoming fax related to home health care coordination for the patient. Please be advised >> Feb 21, 2021  9:32 AM Crist Infante wrote: Granddaughter wants to know if you got the fax concerning the pt? Please call her and let her know. Thanks [phone] Talbert Cage   Do not remember labeling paperwork for this patient. Please advise if received.

## 2021-02-21 NOTE — Telephone Encounter (Signed)
Called phone number given within previous message.  Left message with male, asking to have patient grand daughter cal office back.   Wanted to inform that the fax has not been received

## 2021-02-27 NOTE — Telephone Encounter (Signed)
Granddaughter [name] states she personally brought the paper in for the aid one day last week. Wants to know if this has been done. [phone]  Attn Dr Delford Field on the top. To send to [facility]/ [place], [place]  She wants the aid for med management. Pt also has urinary incontinence at times.

## 2021-02-27 NOTE — Telephone Encounter (Signed)
Please follow up

## 2021-03-02 NOTE — Telephone Encounter (Signed)
Pt's granddaughter called again saying she brought papers to the office to be signed by Dr. Delford Field for nurse care for her grandfather.  She has not heard anything back from the office to say these have been completed and faxed to [facility].  She would like someone to call her back and let her know if this is done.  CB#  [phone]

## 2021-03-07 ENCOUNTER — Ambulatory Visit: Payer: Medicaid Other | Admitting: Critical Care Medicine

## 2021-03-07 NOTE — Progress Notes (Deleted)
Subjective:    Patient ID: [name], male    DOB: October 19, 1961, 60 y.o.   MRN: [id number]  History of Present Illness:  08/17/19 This is a 60 year old male seen recently in the [place] [facility] and was released from prison recently has history of hypertension type 2 diabetes and bipolar disorder.  The patient has had now given access to Lantus and was taking 35 units at bedtime patient also maintains atorvastatin, hydrochlorthiazide,Metformin, and amlodipine.  The patient notes occasional burning in the feet.  He had been on gabapentin previously but is not on gabapentin at this time.  I did resume Zyprexa on this patient 15 mg at bedtime and he has an upcoming appointment with mental health at [facility].  Glucoses have been in the 170 to as high as 300 after meals.  Denies any chest pain or dyspnea.  01/20/2020 This is a telehealth phone visit.  This patient formally was in the [place] but now has moved in with his daughter in [place].  He became ill on January 22 and was tested the same day and was Covid positive in [facility].  The patient notes wheezing cough lightheadedness and significant fatigue at this time.  He did have also some diarrhea which is slowly resolving but still present.  The patient is also lost his sense of smell.  He also has headaches as well.  Patient denies significant shortness of breath.  He is currently on his fifth day of illness and he is a candidate for monoclonal antibody infusion secondary to his diabetes and hypertension and age  He does not monitor his blood sugar and he is run out of his Lantus  02/16/2020 This is a follow-up telephone visit from a January phone visit.  This patient has history of type 2 diabetes with diabetic neuropathy ongoing tobacco use and hypertension.  Note in January he did develop Covid infection and did receive monoclonal antibody therapy on January 28.  He states he did very well with this.  He  states he has had no other symptoms from the Covid at this time.  Patient states his blood sugars have been 130-140 before meals.  He denies any alcohol use.  He now has full Medicaid.  He is in need refills on his medications.  He would like to have his meds to go to a pharmacy in [place] where he is now living.  He currently is still not have any employment.  He still complaining of significant neuropathy type pain and he awakens it night with this  05/10/2020 Patient seen by way of a telephone visit and is doing well overall blood sugars in the 130s fasting he is not drinking his smoking is down to half a pack a day he does not monitor his blood pressure at home.  He does complain of chronic neuropathy.  06/07/2020 Patient returns today for an in office exam complaining of pain in the left leg with recent emergency room visit negative for deep venous thrombosis.  He states the gabapentin has been given for this and it is not helping much.  The pain is in the left upper leg and goes down to the knee.  It feels like a cramping sensation.  Note his weight is up from previous encounters.  He states he is compliant with his diabetes regimen but he is not compliant with his diet.  He denies any shortness of breath or headaches.  He still smokes 1/2 pack a  day of cigarettes.  He is on the Lantus 40 units at bedtime 1000 mg twice daily of metformin.  He follows up with his mental health provider as well.  08/02/2020  This patient is seen in return follow-up for hypertension and poorly controlled diabetes type 2 Schizophrenia now complains of erectile dysfunction  [name] been very resistive to receiving a Covid vaccine due to prior comments will have made with this patient on this.  The patient states his blood sugars have been in the 170s to 150 range at home and he has been compliant with his insulin program.  Patient continues to smoke about 5 cigarettes daily.  There are no other new  complaints.  10/11/20 Patient comes in return follow-up and is having no complaints.  On arrival no blood pressure is elevated 174/106.  He is on amlodipine alone.  Note he has severe allergic reaction to lisinopril.  Heart rates in the 110 range  Note the patient is considering the flu vaccine and will receive it at this visit  Patient is yet to receive the Covid vaccine is pondering this  01/17/21 Patient is seen in return follow-up for hypertension type 2 diabetes.  On arrival hemoglobin A1c was 6.6 blood sugar 104 however blood pressure is 178/104.  The patient had been missing some doses of metoprolol and amlodipine which are his only 2 blood pressure medications.  Patient still smoking a pack a day of cigarettes and still remains under a great deal of stress as he staying at his daughter's home. Patient declined to get a Covid vaccine it is a religious exemption issue as he is in [place] Patient has been compliant with Zyprexa and this is helped his mental health.  The dental abscess he had at the last visit has resolved but he still has poor dentition  03/07/2021  Essential hypertension Hypertension not well controlled plan will be to continue amlodipine 10 mg daily discontinue metoprolol begin Coreg 25 mg twice daily  Patient is encouraged to focus again on smoking cessation  Patient to begin valsartan HCT 320/25 daily  Chronic paranoid schizophrenia ([place]) Controlled paranoid schizophrenia continue Zyprexa  DM (diabetes mellitus), type 2 with neurological complications (HCC) Neuropathy controlled currently with duloxetine  Controlled type 2 diabetes mellitus ([place]) Plan to continue Lantus 50 units daily Trulicity 75 mcg weekly and Metformin at 1000 mg twice daily currently under good control  Tobacco use    Current smoking consumption amount: 1 pack a day  Dicsussion on advise to quit smoking and smoking impacts: Impacts of cardiovascular health  Patient's willingness  to quit: Not ready quit  Methods to quit smoking discussed: Behavioral modification  Medication management of smoking session drugs discussed: None  Resources provided:  AVS   Setting quit date not established  Follow-up arranged 3 months   Time spent counseling the patient: 5 minutes ...   Diagnoses and all orders for this visit:  DM (diabetes mellitus), type 2 with neurological complications ([place]) -     Glucose (CBG), Fasting -     HgB A1c -     Comprehensive metabolic panel -     Lipid panel  Essential hypertension -     CBC with Differential/Platelet; Future -     CBC with Differential/Platelet  Colon cancer screening -     Fecal occult blood, imunochemical  Chronic paranoid schizophrenia ([place])  Controlled type 2 diabetes mellitus without complication, without long-term current use of insulin (HCC)  Tobacco use  Other  orders -     DULoxetine (CYMBALTA) 60 MG capsule; Take 1 capsule (60 mg total) by mouth daily. -     insulin glargine (LANTUS SOLOSTAR) 100 UNIT/ML Solostar Pen; Inject 50 Units into the skin at bedtime. -     atorvastatin (LIPITOR) 20 MG tablet; Take 1 tablet (20 mg total) by mouth daily. Reported on 05/01/2016 -     OLANZapine (ZYPREXA) 15 MG tablet; Take 1 tablet (15 mg total) by mouth at bedtime. -     tadalafil (CIALIS) 10 MG tablet; Take 1 tablet (10 mg total) by mouth daily as needed for erectile dysfunction. -     amLODipine (NORVASC) 10 MG tablet; Take 1 tablet (10 mg total) by mouth daily. -     metFORMIN (GLUCOPHAGE) 1000 MG tablet; Take 1 tablet (1,000 mg total) by mouth 2 (two) times daily with a meal. -     Dulaglutide (TRULICITY) 2.72 ZD/6.6YQ SOPN; Inject 0.75 mg into the skin once a week. -     albuterol (PROAIR HFA) 108 (90 Base) MCG/ACT inhaler; INHALE TWO PUFFS INTO THE LUNGS EVERY 4 HOURS AS NEEDED FOR WHEEZING/SOB -     valsartan-hydrochlorothiazide (DIOVAN-HCT) 320-25 MG tablet; Take 1 tablet by mouth daily. -     carvedilol  (COREG) 25 MG tablet; Take 1 tablet (25 mg total) by mouth 2 (two) times daily with a meal.  .  We will give colon cancer screening kit    Past Medical History:  Diagnosis Date  . COVID-19 virus infection 01/20/2020  . Dental abscess 10/11/2020  . Diabetes mellitus   . Diabetic neuropathy ([place])   . High cholesterol   . Hypertension   . Schizophrenia ([place])   . Toenail fungus 08/17/2019     Family History  Problem Relation Age of Onset  . Hypertension Mother   . Cancer Father   . Alcoholism Other      Social History   Socioeconomic History  . Marital status: Single    Spouse name: Not on file  . Number of children: Not on file  . Years of education: Not on file  . Highest education level: Not on file  Occupational History  . Not on file  Tobacco Use  . Smoking status: Current Every Day Smoker    Packs/day: 0.50    Types: Cigarettes  . Smokeless tobacco: Never Used  Vaping Use  . Vaping Use: Never used  Substance and Sexual Activity  . Alcohol use: No  . Drug use: No  . Sexual activity: Yes  Other Topics Concern  . Not on file  Social History Narrative  . Not on file   Social Determinants of Health   Financial Resource Strain: Not on file  Food Insecurity: Not on file  Transportation Needs: Not on file  Physical Activity: Not on file  Stress: Not on file  Social Connections: Not on file  Intimate Partner Violence: Not on file     Allergies  Allergen Reactions  . Benadryl [Diphenhydramine Hcl]   . Diphenhydramine     GI upset, paradoxic agitation  . Haldol [Haloperidol Lactate]   . Haldol [Haloperidol]     "makes me tremble, bite my tongue"  . Lisinopril Swelling  . Thorazine [Chlorpromazine]     Tremble, bite my tongue  . Thorazine [Chlorpromazine]      Outpatient Medications Prior to Visit  Medication Sig Dispense Refill  . albuterol (PROAIR HFA) 108 (90 Base) MCG/ACT inhaler INHALE TWO PUFFS INTO THE LUNGS  EVERY 4 HOURS AS NEEDED FOR  WHEEZING/SOB 8.5 g 1  . amLODipine (NORVASC) 10 MG tablet Take 1 tablet (10 mg total) by mouth daily. 90 tablet 0  . aspirin EC 81 MG tablet Take 1 tablet (81 mg total) by mouth daily. 30 tablet 1  . atorvastatin (LIPITOR) 20 MG tablet Take 1 tablet (20 mg total) by mouth daily. Reported on 05/01/2016 90 tablet 3  . BD ULTRA-FINE PEN NEEDLES 29G X 12.7MM MISC USE WITH LANTUS PEN 100 each 1  . Blood Glucose Monitoring Suppl (TRUE METRIX METER) w/Device KIT Use as instructed to check blood sugar daily. 1 kit 0  . carvedilol (COREG) 25 MG tablet Take 1 tablet (25 mg total) by mouth 2 (two) times daily with a meal. 120 tablet 3  . Dulaglutide (TRULICITY) 1.60 VP/7.1GG SOPN Inject 0.75 mg into the skin once a week. 2 mL 1  . DULoxetine (CYMBALTA) 60 MG capsule Take 1 capsule (60 mg total) by mouth daily. 90 capsule 0  . glucose blood (TRUE METRIX BLOOD GLUCOSE TEST) test strip Use as instructed to check blood sugar daily. 100 each 12  . insulin glargine (LANTUS SOLOSTAR) 100 UNIT/ML Solostar Pen Inject 50 Units into the skin at bedtime. 3 mL 11  . metFORMIN (GLUCOPHAGE) 1000 MG tablet Take 1 tablet (1,000 mg total) by mouth 2 (two) times daily with a meal. 180 tablet 1  . OLANZapine (ZYPREXA) 15 MG tablet Take 1 tablet (15 mg total) by mouth at bedtime. 30 tablet 0  . tadalafil (CIALIS) 10 MG tablet Take 1 tablet (10 mg total) by mouth daily as needed for erectile dysfunction. 10 tablet 0  . TRUEplus Lancets 28G MISC Use as instructed to check blood sugar daily. 100 each 11  . valsartan-hydrochlorothiazide (DIOVAN-HCT) 320-25 MG tablet Take 1 tablet by mouth daily. 90 tablet 1   No facility-administered medications prior to visit.   Review of Systems  Pos In BOLD  Constitutional:  weight loss, night sweats,  Fevers, chills, fatigue, lassitude. HEENT:   headaches,  Difficulty swallowing,  Tooth/dental problems,  Sore throat,                No sneezing, itching, ear ache, nasal congestion, post nasal  drip,   CV:   chest pain,  Orthopnea, PND, swelling in lower extremities, anasarca, dizziness, palpitations  GI heartburn, indigestion, abdominal pain, nausea, vomiting, diarrhea, change in bowel habits, loss of appetite  Resp: No shortness of breath with exertion or at rest.  No excess mucus, no productive cough,  No non-productive cough,  No coughing up of blood.  No change in color of mucus.  No wheezing.  No chest wall deformity  Skin: no rash or lesions.  GU: no dysuria, change in color of urine, no urgency or frequency.  No flank pain.  MS:  No joint pain or swelling.  No decreased range of motion.  No back pain.   Pain in both lower legs neuropathic  Psych:  No change in mood or affect. No depression or anxiety.  No memory loss.     Objective:   Physical Exam   There were no vitals filed for this visit.  Gen: Pleasant, well-nourished, in no distress,  normal affect  ENT: No lesions, abscess in the gum of the right upper jaw first molar has resolved but poor dentition in the molar areas,  oropharynx clear, no postnasal drip  Neck: No JVD, no TMG, no carotid bruits  Lungs: No use  of accessory muscles, no dullness to percussion, clear without rales or rhonchi  Cardiovascular: RRR, heart sounds normal, no murmur or gallops, no peripheral edema  Abdomen: soft and NT, no HSM,  BS normal  Musculoskeletal: No deformities, no cyanosis or clubbing  Neuro: alert, non focal  Skin: Warm, no lesions or rashes   Assessment & Plan:  I personally reviewed all images and lab data in the [facility] system as well as any outside material available during this office visit and agree with the  radiology impressions.   No problem-specific Assessment & Plan notes found for this encounter.   There are no diagnoses linked to this encounter..  We will give colon cancer screening kit

## 2021-03-21 ENCOUNTER — Other Ambulatory Visit: Payer: Self-pay | Admitting: Critical Care Medicine

## 2021-04-11 ENCOUNTER — Other Ambulatory Visit: Payer: Self-pay | Admitting: Critical Care Medicine

## 2021-04-18 ENCOUNTER — Other Ambulatory Visit: Payer: Self-pay | Admitting: Critical Care Medicine

## 2021-04-25 ENCOUNTER — Ambulatory Visit: Payer: Medicaid Other | Attending: Critical Care Medicine | Admitting: Critical Care Medicine

## 2021-04-25 ENCOUNTER — Encounter: Payer: Self-pay | Admitting: Critical Care Medicine

## 2021-04-25 ENCOUNTER — Other Ambulatory Visit: Payer: Self-pay

## 2021-04-25 VITALS — BP 134/84 | HR 68 | Resp 17 | Ht 71.0 in | Wt 209.2 lb

## 2021-04-25 DIAGNOSIS — N5201 Erectile dysfunction due to arterial insufficiency: Secondary | ICD-10-CM | POA: Diagnosis not present

## 2021-04-25 DIAGNOSIS — F200 Paranoid schizophrenia: Secondary | ICD-10-CM | POA: Diagnosis not present

## 2021-04-25 DIAGNOSIS — F319 Bipolar disorder, unspecified: Secondary | ICD-10-CM | POA: Diagnosis present

## 2021-04-25 DIAGNOSIS — E119 Type 2 diabetes mellitus without complications: Secondary | ICD-10-CM

## 2021-04-25 DIAGNOSIS — Z1211 Encounter for screening for malignant neoplasm of colon: Secondary | ICD-10-CM

## 2021-04-25 DIAGNOSIS — Z794 Long term (current) use of insulin: Secondary | ICD-10-CM | POA: Diagnosis not present

## 2021-04-25 DIAGNOSIS — E1149 Type 2 diabetes mellitus with other diabetic neurological complication: Secondary | ICD-10-CM

## 2021-04-25 DIAGNOSIS — Z7982 Long term (current) use of aspirin: Secondary | ICD-10-CM | POA: Diagnosis not present

## 2021-04-25 DIAGNOSIS — Z720 Tobacco use: Secondary | ICD-10-CM

## 2021-04-25 DIAGNOSIS — Z79899 Other long term (current) drug therapy: Secondary | ICD-10-CM | POA: Insufficient documentation

## 2021-04-25 DIAGNOSIS — Z28310 Unvaccinated for covid-19: Secondary | ICD-10-CM | POA: Diagnosis not present

## 2021-04-25 DIAGNOSIS — Z8616 Personal history of COVID-19: Secondary | ICD-10-CM | POA: Insufficient documentation

## 2021-04-25 DIAGNOSIS — I10 Essential (primary) hypertension: Secondary | ICD-10-CM

## 2021-04-25 DIAGNOSIS — F17210 Nicotine dependence, cigarettes, uncomplicated: Secondary | ICD-10-CM | POA: Insufficient documentation

## 2021-04-25 DIAGNOSIS — E1100 Type 2 diabetes mellitus with hyperosmolarity without nonketotic hyperglycemic-hyperosmolar coma (NKHHC): Secondary | ICD-10-CM | POA: Diagnosis not present

## 2021-04-25 DIAGNOSIS — R195 Other fecal abnormalities: Secondary | ICD-10-CM | POA: Insufficient documentation

## 2021-04-25 DIAGNOSIS — Z7984 Long term (current) use of oral hypoglycemic drugs: Secondary | ICD-10-CM | POA: Insufficient documentation

## 2021-04-25 LAB — POCT GLYCOSYLATED HEMOGLOBIN (HGB A1C): HbA1c, POC (controlled diabetic range): 6.9 % (ref 0.0–7.0)

## 2021-04-25 LAB — GLUCOSE, POCT (MANUAL RESULT ENTRY): POC Glucose: 135 mg/dl — AB (ref 70–99)

## 2021-04-25 MED ORDER — TADALAFIL 10 MG PO TABS: 10.0000 mg | ORAL_TABLET | Freq: Every day | ORAL | 3 refills | Status: DC | PRN

## 2021-04-25 MED ORDER — OLANZAPINE 15 MG PO TABS: 15.0000 mg | ORAL_TABLET | Freq: Every day | ORAL | 4 refills | Status: DC

## 2021-04-25 MED ORDER — VALSARTAN-HYDROCHLOROTHIAZIDE 320-25 MG PO TABS: 1.0000 | ORAL_TABLET | Freq: Every day | ORAL | 3 refills | Status: DC

## 2021-04-25 MED ORDER — TRUEPLUS LANCETS 28G MISC: 11 refills | Status: DC

## 2021-04-25 MED ORDER — ALBUTEROL SULFATE HFA 108 (90 BASE) MCG/ACT IN AERS: INHALATION_SPRAY | RESPIRATORY_TRACT | 2 refills | Status: DC

## 2021-04-25 MED ORDER — TRULICITY 0.75 MG/0.5ML ~~LOC~~ SOAJ: 0.7500 mg | SUBCUTANEOUS | 3 refills | Status: DC

## 2021-04-25 MED ORDER — LANTUS SOLOSTAR 100 UNIT/ML ~~LOC~~ SOPN: 50.0000 [IU] | PEN_INJECTOR | Freq: Every day | SUBCUTANEOUS | 11 refills | Status: DC

## 2021-04-25 MED ORDER — TRUE METRIX BLOOD GLUCOSE TEST VI STRP: ORAL_STRIP | 12 refills | Status: DC

## 2021-04-25 MED ORDER — ATORVASTATIN CALCIUM 20 MG PO TABS: 20.0000 mg | ORAL_TABLET | Freq: Every day | ORAL | 3 refills | Status: DC

## 2021-04-25 MED ORDER — BD PEN NEEDLE ORIGINAL U/F 29G X 12.7MM MISC: 1 refills | Status: DC

## 2021-04-25 MED ORDER — ASPIRIN EC 81 MG PO TBEC: 81.0000 mg | DELAYED_RELEASE_TABLET | Freq: Every day | ORAL | 1 refills | Status: DC

## 2021-04-25 MED ORDER — AMLODIPINE BESYLATE 10 MG PO TABS: 10.0000 mg | ORAL_TABLET | Freq: Every day | ORAL | 3 refills | Status: DC

## 2021-04-25 MED ORDER — CARVEDILOL 25 MG PO TABS: 25.0000 mg | ORAL_TABLET | Freq: Two times a day (BID) | ORAL | 3 refills | Status: DC

## 2021-04-25 MED ORDER — METFORMIN HCL 1000 MG PO TABS: ORAL_TABLET | ORAL | 2 refills | Status: DC

## 2021-04-25 MED ORDER — DULOXETINE HCL 60 MG PO CPEP: 60.0000 mg | ORAL_CAPSULE | Freq: Every day | ORAL | 3 refills | Status: DC

## 2021-04-25 NOTE — Progress Notes (Signed)
Subjective:    Patient ID: [name], male    DOB: 1961-01-30, 60 y.o.   MRN: [id number]  History of Present Illness:  08/17/19 This is a 60 year old male seen recently in the [place] clinic and was released from prison recently has history of hypertension type 2 diabetes and bipolar disorder.  The patient has had now given access to Lantus and was taking 35 units at bedtime patient also maintains atorvastatin, hydrochlorthiazide,Metformin, and amlodipine.  The patient notes occasional burning in the feet.  He had been on gabapentin previously but is not on gabapentin at this time.  I did resume Zyprexa on this patient 15 mg at bedtime and he has an upcoming appointment with mental health at [facility].  Glucoses have been in the 170 to as high as 300 after meals.  Denies any chest pain or dyspnea.  01/20/2020 This is a telehealth phone visit.  This patient formally was in the [place] but now has moved in with his daughter in [place].  He became ill on January 22 and was tested the same day and was Covid positive in [facility].  The patient notes wheezing cough lightheadedness and significant fatigue at this time.  He did have also some diarrhea which is slowly resolving but still present.  The patient is also lost his sense of smell.  He also has headaches as well.  Patient denies significant shortness of breath.  He is currently on his fifth day of illness and he is a candidate for monoclonal antibody infusion secondary to his diabetes and hypertension and age  He does not monitor his blood sugar and he is run out of his Lantus  02/16/2020 This is a follow-up telephone visit from a January phone visit.  This patient has history of type 2 diabetes with diabetic neuropathy ongoing tobacco use and hypertension.  Note in January he did develop Covid infection and did receive monoclonal antibody therapy on January 28.  He states he did very well with this.  He  states he has had no other symptoms from the Covid at this time.  Patient states his blood sugars have been 130-140 before meals.  He denies any alcohol use.  He now has full Medicaid.  He is in need refills on his medications.  He would like to have his meds to go to a pharmacy in [place] where he is now living.  He currently is still not have any employment.  He still complaining of significant neuropathy type pain and he awakens it night with this  05/10/2020 Patient seen by way of a telephone visit and is doing well overall blood sugars in the 130s fasting he is not drinking his smoking is down to half a pack a day he does not monitor his blood pressure at home.  He does complain of chronic neuropathy.  06/07/2020 Patient returns today for an in office exam complaining of pain in the left leg with recent emergency room visit negative for deep venous thrombosis.  He states the gabapentin has been given for this and it is not helping much.  The pain is in the left upper leg and goes down to the knee.  It feels like a cramping sensation.  Note his weight is up from previous encounters.  He states he is compliant with his diabetes regimen but he is not compliant with his diet.  He denies any shortness of breath or headaches.  He still smokes 1/2 pack a  day of cigarettes.  He is on the Lantus 40 units at bedtime 1000 mg twice daily of metformin.  He follows up with his mental health provider as well.  08/02/2020  This patient is seen in return follow-up for hypertension and poorly controlled diabetes type 2 Schizophrenia now complains of erectile dysfunction  [name] been very resistive to receiving a Covid vaccine due to prior comments will have made with this patient on this.  The patient states his blood sugars have been in the 170s to 150 range at home and he has been compliant with his insulin program.  Patient continues to smoke about 5 cigarettes daily.  There are no other new  complaints.  10/11/20 Patient comes in return follow-up and is having no complaints.  On arrival no blood pressure is elevated 174/106.  He is on amlodipine alone.  Note he has severe allergic reaction to lisinopril.  Heart rates in the 110 range  Note the patient is considering the flu vaccine and will receive it at this visit  Patient is yet to receive the Covid vaccine is pondering this  01/17/21 Patient is seen in return follow-up for hypertension type 2 diabetes.  On arrival hemoglobin A1c was 6.6 blood sugar 104 however blood pressure is 178/104.  The patient had been missing some doses of metoprolol and amlodipine which are his only 2 blood pressure medications.  Patient still smoking a pack a day of cigarettes and still remains under a great deal of stress as he staying at his daughter's home. Patient declined to get a Covid vaccine it is a religious exemption issue as he is in [place] Patient has been compliant with Zyprexa and this is helped his mental health.  The dental abscess he had at the last visit has resolved but he still has poor dentition  04/25/2021 Since the last visit this patient has moved to [place] living in an apartment with another gentleman he met in the homeless shelter.  He is hoping to get his own apartment soon.  On arrival A1c is 6.9 blood sugar 335 and blood pressure 134/84.  Note he is out of aspirin needs refills also just ran out of Coreg.  He has been compliant with his blood pressure. Note patient has neuropathy currently controlled with duloxetine and paranoid schizophrenia controlled with Zyprexa.  Patient does maintain Lantus 50 units daily Trulicity 75 mcg weekly and metformin 1000 mg twice daily and his diet has varied a bit so we reeducated him on his diet  The patient lost his colon cancer screening kit this will need to be reissued  Patient has no other complaints  Past Medical History:  Diagnosis Date  . COVID-19 virus infection 01/20/2020   . Dental abscess 10/11/2020  . Diabetes mellitus   . Diabetic neuropathy ([place])   . High cholesterol   . Hypertension   . Schizophrenia ([place])   . Toenail fungus 08/17/2019     Family History  Problem Relation Age of Onset  . Hypertension Mother   . Cancer Father   . Alcoholism Other      Social History   Socioeconomic History  . Marital status: Single    Spouse name: Not on file  . Number of children: Not on file  . Years of education: Not on file  . Highest education level: Not on file  Occupational History  . Not on file  Tobacco Use  . Smoking status: Current Every Day Smoker    Packs/day: 0.50  Types: Cigarettes  . Smokeless tobacco: Never Used  Vaping Use  . Vaping Use: Never used  Substance and Sexual Activity  . Alcohol use: No  . Drug use: No  . Sexual activity: Yes  Other Topics Concern  . Not on file  Social History Narrative  . Not on file   Social Determinants of Health   Financial Resource Strain: Not on file  Food Insecurity: Not on file  Transportation Needs: Not on file  Physical Activity: Not on file  Stress: Not on file  Social Connections: Not on file  Intimate Partner Violence: Not on file     Allergies  Allergen Reactions  . Benadryl [Diphenhydramine Hcl]   . Diphenhydramine     GI upset, paradoxic agitation  . Haldol [Haloperidol Lactate]   . Haldol [Haloperidol]     "makes me tremble, bite my tongue"  . Lisinopril Swelling  . Thorazine [Chlorpromazine]     Tremble, bite my tongue  . Thorazine [Chlorpromazine]      Outpatient Medications Prior to Visit  Medication Sig Dispense Refill  . Blood Glucose Monitoring Suppl (TRUE METRIX METER) w/Device KIT Use as instructed to check blood sugar daily. 1 kit 0  . albuterol (PROAIR HFA) 108 (90 Base) MCG/ACT inhaler INHALE 2 PUFFS INTO LUNGS EVERY FOUR HOURS AS NEEDED FOR WHEEZING/ SHORTNESS OF BREATH 8.5 g 0  . amLODipine (NORVASC) 10 MG tablet Take 1 tablet (10 mg total) by mouth  daily. 90 tablet 0  . aspirin EC 81 MG tablet Take 1 tablet (81 mg total) by mouth daily. 30 tablet 1  . atorvastatin (LIPITOR) 20 MG tablet Take 1 tablet (20 mg total) by mouth daily. Reported on 05/01/2016 90 tablet 3  . BD ULTRA-FINE PEN NEEDLES 29G X 12.7MM MISC USE WITH LANTUS PEN 100 each 1  . carvedilol (COREG) 25 MG tablet Take 1 tablet (25 mg total) by mouth 2 (two) times daily with a meal. 120 tablet 3  . DULoxetine (CYMBALTA) 60 MG capsule Take 1 capsule (60 mg total) by mouth daily. 90 capsule 0  . glucose blood (TRUE METRIX BLOOD GLUCOSE TEST) test strip Use as instructed to check blood sugar daily. 100 each 12  . insulin glargine (LANTUS SOLOSTAR) 100 UNIT/ML Solostar Pen Inject 50 Units into the skin at bedtime. 3 mL 11  . metFORMIN (GLUCOPHAGE) 1000 MG tablet TAKE ONE TABLET BY MOUTH TWICE A DAY DAILY WITH A MEAL 180 tablet 0  . OLANZapine (ZYPREXA) 15 MG tablet Take 1 tablet (15 mg total) by mouth at bedtime. 30 tablet 0  . tadalafil (CIALIS) 10 MG tablet Take 1 tablet (10 mg total) by mouth daily as needed for erectile dysfunction. 10 tablet 0  . TRUEplus Lancets 28G MISC Use as instructed to check blood sugar daily. 100 each 11  . TRULICITY 7.67 MC/9.4BS SOPN INJECT 0.75 MG INTO THE SKIN ONCE A WEEK 2 mL 2  . valsartan-hydrochlorothiazide (DIOVAN-HCT) 320-25 MG tablet Take 1 tablet by mouth daily. 90 tablet 1   No facility-administered medications prior to visit.   Review of Systems  Constitutional: Negative.   HENT: Positive for dental problem.   Eyes: Negative.   Respiratory: Negative.   Cardiovascular: Negative.   Gastrointestinal: Negative.   Genitourinary: Negative.        ED  Musculoskeletal: Negative.   Skin: Positive for rash.  Neurological: Negative.   Psychiatric/Behavioral: Negative for behavioral problems, confusion, decreased concentration, dysphoric mood, hallucinations, self-injury, sleep disturbance and suicidal ideas. The  patient is not nervous/anxious  and is not hyperactive.       Objective:   Physical Exam   Vitals:   04/25/21 1354  BP: 134/84  Pulse: 68  Resp: 17  SpO2: 98%  Weight: 209 lb 3.2 oz (94.9 kg)  Height: 5' 11"  (1.803 m)    Gen: Pleasant, well-nourished, in no distress,  normal affect  ENT: No lesions, abscess in the gum of the right upper jaw first molar has resolved but poor dentition in the molar areas,  oropharynx clear, no postnasal drip  Neck: No JVD, no TMG, no carotid bruits  Lungs: No use of accessory muscles, no dullness to percussion, clear without rales or rhonchi  Cardiovascular: RRR, heart sounds normal, no murmur or gallops, no peripheral edema  Abdomen: soft and NT, no HSM,  BS normal  Musculoskeletal: No deformities, no cyanosis or clubbing  Neuro: alert, non focal  Skin: Warm, no lesions or rashes   Assessment & Plan:  I personally reviewed all images and lab data in the [facility] system as well as any outside material available during this office visit and agree with the  radiology impressions.   Essential hypertension Patient currently has controlled hypertension on triple therapy that is beta-blocker valsartan HCT and amlodipine will refill and make no further changes  Controlled type 2 diabetes mellitus ([place]) Diabetes type 2 still at goal at less than 7 we will reeducate as to proper diet and continue Trulicity Lantus and metformin  DM (diabetes mellitus), type 2 with neurological complications ([place]) Continue duloxetine for neuropathy  Tobacco use    . Current smoking consumption amount: 1 pack a day  . Dicsussion on advise to quit smoking and smoking impacts: Impacts of cardiovascular health  . Patient's willingness to quit: Not ready quit  . Methods to quit smoking discussed: Behavioral modification  . Medication management of smoking session drugs discussed: None  . Resources provided:  AVS   . Setting quit date not established  . Follow-up arranged 81month   Time  spent counseling the patient: 5 minutes ...  Erectile dysfunction Plan to refill Cialis this has been effective  Chronic paranoid schizophrenia ([facility] Stable on Zyprexa we will refill   Alfredo was seen today for diabetes.  Diagnoses and all orders for this visit:  DM (diabetes mellitus), type 2 with neurological complications ([place] -     POCT glucose (manual entry) -     POCT glycosylated hemoglobin (Hb A1C)  Colon cancer screening -     Fecal occult blood, imunochemical  Type 2 diabetes mellitus with hyperosmolarity without coma, with long-term current use of insulin (HCC) -     glucose blood (TRUE METRIX BLOOD GLUCOSE TEST) test strip; Use as instructed to check blood sugar daily. -     TRUEplus Lancets 28G MISC; Use as instructed to check blood sugar daily.  Essential hypertension  Controlled type 2 diabetes mellitus without complication, without long-term current use of insulin (HCC)  Tobacco use  Erectile dysfunction due to arterial insufficiency  Chronic paranoid schizophrenia ([place]  Other orders -     albuterol (PROAIR HFA) 108 (90 Base) MCG/ACT inhaler; INHALE 2 PUFFS INTO LUNGS EVERY FOUR HOURS AS NEEDED FOR WHEEZING/ SHORTNESS OF BREATH -     amLODipine (NORVASC) 10 MG tablet; Take 1 tablet (10 mg total) by mouth daily. -     aspirin EC 81 MG tablet; Take 1 tablet (81 mg total) by mouth daily. -     atorvastatin (  LIPITOR) 20 MG tablet; Take 1 tablet (20 mg total) by mouth daily. Reported on 05/01/2016 -     BD ULTRA-FINE PEN NEEDLES 29G X 12.7MM MISC; USE WITH LANTUS PEN -     carvedilol (COREG) 25 MG tablet; Take 1 tablet (25 mg total) by mouth 2 (two) times daily with a meal. -     DULoxetine (CYMBALTA) 60 MG capsule; Take 1 capsule (60 mg total) by mouth daily. -     insulin glargine (LANTUS SOLOSTAR) 100 UNIT/ML Solostar Pen; Inject 50 Units into the skin at bedtime. -     metFORMIN (GLUCOPHAGE) 1000 MG tablet; TAKE ONE TABLET BY MOUTH TWICE A DAY DAILY WITH A  MEAL -     OLANZapine (ZYPREXA) 15 MG tablet; Take 1 tablet (15 mg total) by mouth at bedtime. -     tadalafil (CIALIS) 10 MG tablet; Take 1 tablet (10 mg total) by mouth daily as needed for erectile dysfunction. -     Dulaglutide (TRULICITY) 7.01 TY/0.3EJ SOPN; Inject 0.75 mg into the skin once a week. -     valsartan-hydrochlorothiazide (DIOVAN-HCT) 320-25 MG tablet; Take 1 tablet by mouth daily.  .  We will give colon cancer screening kit again Pt given dental resources I spent 28 minutes interviewing the patient reviewing his records refilling medications and formulating a moderate complexity medical decision making plan

## 2021-04-25 NOTE — Assessment & Plan Note (Signed)
Plan to refill Cialis this has been effective

## 2021-04-25 NOTE — Assessment & Plan Note (Signed)
  .   Current smoking consumption amount: 1 pack a day  . Dicsussion on advise to quit smoking and smoking impacts: Impacts of cardiovascular health  . Patient's willingness to quit: Not ready quit  . Methods to quit smoking discussed: Behavioral modification  . Medication management of smoking session drugs discussed: None  . Resources provided:  AVS   . Setting quit date not established  . Follow-up arranged 3months   Time spent counseling the patient: 5 minutes .Marland KitchenMarland Kitchen

## 2021-04-25 NOTE — Patient Instructions (Signed)
Please obtain a dental appointment within the recommended dentists to assess having some of the teeth removed  Refills on all your medications sent to your [place]  Please obtain a stool sample and follow the process and bring the fecal occult kit back in so we can check you for colon cancer  Return to see Dr. Joya Gaskins 4 months

## 2021-04-25 NOTE — Assessment & Plan Note (Signed)
Diabetes type 2 still at goal at less than 7 we will reeducate as to proper diet and continue Trulicity Lantus and metformin

## 2021-04-25 NOTE — Assessment & Plan Note (Signed)
Stable on Zyprexa we will refill

## 2021-04-25 NOTE — Assessment & Plan Note (Signed)
Continue duloxetine for neuropathy 

## 2021-04-25 NOTE — Assessment & Plan Note (Signed)
Patient currently has controlled hypertension on triple therapy that is beta-blocker valsartan HCT and amlodipine will refill and make no further changes

## 2021-07-11 ENCOUNTER — Other Ambulatory Visit: Payer: Self-pay | Admitting: Critical Care Medicine

## 2021-07-11 ENCOUNTER — Ambulatory Visit: Payer: Self-pay

## 2021-07-11 NOTE — Telephone Encounter (Signed)
Pt. Calling back. Requests Dr. Delford Field "make the appointment for me now.I don't want to wait to get a list in the mail." Instructed pt. To go to ED if pain is not relieved with OTC. Verbalizes understanding. Please advise pt.

## 2021-07-11 NOTE — Telephone Encounter (Signed)
Requested medication (s) are due for refill today:   Requested medication (s) are on the active medication list: Yes  Last refill:  04/25/21  Future visit scheduled: Yes  Notes to clinic:  See pharmacy request.    Requested Prescriptions  Pending Prescriptions Disp Refills   LANTUS SOLOSTAR 100 UNIT/ML Solostar Pen [Pharmacy Med Name: LANTUS SOLOSTAR 100U/ML INJ] 150 mL 8    Sig: INJECT 50 UNITS INTO SKIN AT BEDTIME      Endocrinology:  Diabetes - Insulins Passed - 07/11/2021  1:50 PM      Passed - HBA1C is between 0 and 7.9 and within 180 days    HbA1c, POC (controlled diabetic range)  Date Value Ref Range Status  04/25/2021 6.9 0.0 - 7.0 % Final          Passed - Valid encounter within last 6 months    Recent Outpatient Visits           2 months ago Controlled type 2 diabetes mellitus without complication, without long-term current use of insulin (HCC)   [facility] Storm Frisk, MD   5 months ago DM (diabetes mellitus), type 2 with neurological complications [facility])   [facility] Storm Frisk, MD   9 months ago Controlled type 2 diabetes mellitus without complication, without long-term current use of insulin [facility])   [facility] Storm Frisk, MD   11 months ago DM (diabetes mellitus), type 2 with neurological complications [facility])   [facility] Storm Frisk, MD   1 year ago DM (diabetes mellitus), type 2 with neurological complications [facility])   [facility] Storm Frisk, MD       Future Appointments             In 1 month Delford Field Charlcie Cradle, MD [facility]

## 2021-07-11 NOTE — Telephone Encounter (Signed)
Pt. States he has several teeth that have broken "off in my mouth and I'm having a lot of pain. I'm taking some gabapentin I have for pain." States the teeth are broken off into the gums and his gums are swollen.Does not have a dentist. Requests a referral to a dentist. Please advise.

## 2021-07-11 NOTE — Telephone Encounter (Signed)
Patient will be mailed medicaid dental listing.

## 2021-07-11 NOTE — Telephone Encounter (Signed)
Answer Assessment - Initial Assessment Questions 1. LOCATION: "Which tooth is hurting?"  (e.g., right-side/left-side, upper/lower, front/back)     Several teeth 2. ONSET: "When did the toothache start?"  (e.g., hours, days)      2 days ago 3. SEVERITY: "How bad is the toothache?"  (Scale 1-10; mild, moderate or severe)   - MILD (1-3): doesn't interfere with chewing    - MODERATE (4-7): interferes with chewing, interferes with normal activities, awakens from sleep     - SEVERE (8-10): unable to eat, unable to do any normal activities, excruciating pain        Severe 4. SWELLING: "Is there any visible swelling of your face?"     Gums are swollen 5. OTHER SYMPTOMS: "Do you have any other symptoms?" (e.g., fever)     No 6. PREGNANCY: "Is there any chance you are pregnant?" "When was your last menstrual period?"     N/a  Protocols used: Toothache-A-AH

## 2021-07-31 ENCOUNTER — Other Ambulatory Visit: Payer: Self-pay | Admitting: Critical Care Medicine

## 2021-08-28 ENCOUNTER — Ambulatory Visit: Payer: Medicaid Other | Admitting: Critical Care Medicine

## 2021-09-11 ENCOUNTER — Other Ambulatory Visit: Payer: Self-pay | Admitting: Critical Care Medicine

## 2021-10-09 ENCOUNTER — Other Ambulatory Visit: Payer: Self-pay | Admitting: Critical Care Medicine

## 2021-10-16 NOTE — Progress Notes (Signed)
Established Patient Office Visit  Subjective:  Patient ID: [name], male    DOB: 16-Jun-1961  Age: 59 y.o. MRN: [id number]  CC:  Chief Complaint  Patient presents with   Diabetes    HPI [name] presents for primary care follow-up.  Patient does have type 2 diabetes and on arrival A1c is 7.7 which is up from 6.9 earlier this year.  Patient is already had his flu vaccine.  Patient's been on metformin and Trulicity but at a lower dose of the Trulicity.  Patient is yet to get his colon cancer screening performed.  He does have a severe teeth cavities.  Also has periodontal disease.  He now has an apartment here in [place].  Patient agreed to receive his flu vaccine at this visit. Still complains of some epigastric abdominal pain at times with reflux Past Medical History:  Diagnosis Date   COVID-19 virus infection 01/20/2020   Dental abscess 10/11/2020   Diabetes mellitus    Diabetic neuropathy (HCC)    High cholesterol    Hypertension    Schizophrenia ([place])    Toenail fungus 08/17/2019    Past Surgical History:  Procedure Laterality Date   TONSILLECTOMY      Family History  Problem Relation Age of Onset   Hypertension Mother    Cancer Father    Alcoholism Other     Social History   Socioeconomic History   Marital status: Single    Spouse name: Not on file   Number of children: Not on file   Years of education: Not on file   Highest education level: Not on file  Occupational History   Not on file  Tobacco Use   Smoking status: Every Day    Packs/day: 0.50    Types: Cigarettes   Smokeless tobacco: Never  Vaping Use   Vaping Use: Never used  Substance and Sexual Activity   Alcohol use: No   Drug use: No   Sexual activity: Yes  Other Topics Concern   Not on file  Social History Narrative   Not on file   Social Determinants of Health   Financial Resource Strain: Not on file  Food Insecurity: Not on file  Transportation Needs: Not on  file  Physical Activity: Not on file  Stress: Not on file  Social Connections: Not on file  Intimate Partner Violence: Not on file    Outpatient Medications Prior to Visit  Medication Sig Dispense Refill   albuterol (PROAIR HFA) 108 (90 Base) MCG/ACT inhaler INHALE TWO PUFFS BY MOUTH INTO THE LUNGS EVERY 4 HOURS AS NEEDED FOR WHEEZING OR SHORTNESS OF BREATH 8.5 g 1   glucose blood (TRUE METRIX BLOOD GLUCOSE TEST) test strip Use as instructed to check blood sugar daily. 100 each 12   amLODipine (NORVASC) 10 MG tablet Take 1 tablet (10 mg total) by mouth daily. 90 tablet 3   aspirin EC 81 MG tablet Take 1 tablet (81 mg total) by mouth daily. 100 tablet 1   atorvastatin (LIPITOR) 20 MG tablet Take 1 tablet (20 mg total) by mouth daily. Reported on 05/01/2016 90 tablet 3   BD ULTRA-FINE PEN NEEDLES 29G X 12.7MM MISC USE WITH LANTUS PEN 100 each 1   Blood Glucose Monitoring Suppl (TRUE METRIX METER) w/Device KIT Use as instructed to check blood sugar daily. 1 kit 0   carvedilol (COREG) 25 MG tablet TAKE ONE TABLET BY MOUTH TWICE A DAY WITH MEALS 120 tablet 4   DULoxetine (CYMBALTA)  60 MG capsule Take 1 capsule (60 mg total) by mouth daily. 90 capsule 3   LANTUS SOLOSTAR 100 UNIT/ML Solostar Pen INJECT 50 UNITS INTO SKIN AT BEDTIME 15 mL 0   metFORMIN (GLUCOPHAGE) 1000 MG tablet TAKE ONE TABLET BY MOUTH TWICE A DAY DAILY WITH A MEAL 180 tablet 2   OLANZapine (ZYPREXA) 15 MG tablet Take 1 tablet (15 mg total) by mouth at bedtime. 30 tablet 4   tadalafil (CIALIS) 10 MG tablet Take 1 tablet (10 mg total) by mouth daily as needed for erectile dysfunction. 10 tablet 3   TRUEplus Lancets 28G MISC Use as instructed to check blood sugar daily. 161 each 11   TRULICITY 0.96 EA/5.4UJ SOPN INJECT 0.75 MG INTO THE SKIN ONCE WEEKLY 2 mL 0   valsartan-hydrochlorothiazide (DIOVAN-HCT) 320-25 MG tablet Take 1 tablet by mouth daily. 90 tablet 3   No facility-administered medications prior to visit.    Allergies   Allergen Reactions   Benadryl [Diphenhydramine Hcl]    Diphenhydramine     GI upset, paradoxic agitation   Haldol [Haloperidol Lactate]    Haldol [Haloperidol]     "makes me tremble, bite my tongue"   Lisinopril Swelling   Thorazine [Chlorpromazine]     Tremble, bite my tongue   Thorazine [Chlorpromazine]     ROS Review of Systems  Constitutional: Negative.  Negative for chills, diaphoresis and fever.  HENT: Negative.  Negative for congestion, ear pain, hearing loss, nosebleeds, postnasal drip, rhinorrhea, sinus pressure, sore throat, tinnitus, trouble swallowing and voice change.   Eyes: Negative.  Negative for photophobia and redness.  Respiratory: Negative.  Negative for apnea, cough, choking, chest tightness, shortness of breath, wheezing and stridor.   Cardiovascular: Negative.  Negative for chest pain, palpitations and leg swelling.  Gastrointestinal:  Positive for abdominal pain. Negative for abdominal distention, blood in stool, constipation, diarrhea, nausea and vomiting.       LUQ pain  Endocrine: Negative for polydipsia.  Genitourinary: Negative.  Negative for dysuria, flank pain, frequency, hematuria and urgency.  Musculoskeletal: Negative.  Negative for arthralgias, back pain, myalgias and neck pain.  Skin: Negative.  Negative for rash.  Allergic/Immunologic: Negative.  Negative for environmental allergies and food allergies.  Neurological: Negative.  Negative for dizziness, tremors, seizures, syncope, weakness and headaches.  Hematological: Negative.  Negative for adenopathy. Does not bruise/bleed easily.  Psychiatric/Behavioral: Negative.  Negative for agitation, behavioral problems, confusion, decreased concentration, dysphoric mood, self-injury, sleep disturbance and suicidal ideas. The patient is not nervous/anxious and is not hyperactive.      Objective:    Physical Exam Vitals reviewed.  Constitutional:      Appearance: Normal appearance. He is  well-developed. He is not diaphoretic.  HENT:     Head: Normocephalic and atraumatic.     Nose: No nasal deformity, septal deviation, mucosal edema or rhinorrhea.     Right Sinus: No maxillary sinus tenderness or frontal sinus tenderness.     Left Sinus: No maxillary sinus tenderness or frontal sinus tenderness.     Mouth/Throat:     Pharynx: No oropharyngeal exudate.  Eyes:     General: No scleral icterus.    Conjunctiva/sclera: Conjunctivae normal.     Pupils: Pupils are equal, round, and reactive to light.  Neck:     Thyroid: No thyromegaly.     Vascular: No carotid bruit or JVD.     Trachea: Trachea normal. No tracheal tenderness or tracheal deviation.  Cardiovascular:     Rate and Rhythm:  Normal rate and regular rhythm.     Chest Wall: PMI is not displaced.     Pulses: Normal pulses. No decreased pulses.     Heart sounds: Normal heart sounds, S1 normal and S2 normal. Heart sounds not distant. No murmur heard. No systolic murmur is present.  No diastolic murmur is present.    No friction rub. No gallop. No S3 or S4 sounds.  Pulmonary:     Effort: No tachypnea, accessory muscle usage or respiratory distress.     Breath sounds: No stridor. No decreased breath sounds, wheezing, rhonchi or rales.  Chest:     Chest wall: No tenderness.  Abdominal:     General: Bowel sounds are normal. There is no distension.     Palpations: Abdomen is soft. Abdomen is not rigid.     Tenderness: There is abdominal tenderness. There is no guarding or rebound.     Comments: Mild epigastric tenderness no rebound or guarding  Musculoskeletal:        General: Normal range of motion.     Cervical back: Normal range of motion and neck supple. No edema, erythema or rigidity. No muscular tenderness. Normal range of motion.  Lymphadenopathy:     Head:     Right side of head: No submental or submandibular adenopathy.     Left side of head: No submental or submandibular adenopathy.     Cervical: No  cervical adenopathy.  Skin:    General: Skin is warm and dry.     Coloration: Skin is not pale.     Findings: No rash.     Nails: There is no clubbing.  Neurological:     Mental Status: He is alert and oriented to person, place, and time.     Sensory: No sensory deficit.  Psychiatric:        Speech: Speech normal.        Behavior: Behavior normal.    BP (!) 164/99   Pulse 64   Resp 16   Wt 220 lb 12.8 oz (100.2 kg)   SpO2 100%   BMI 30.80 kg/m  Wt Readings from Last 3 Encounters:  10/17/21 220 lb 12.8 oz (100.2 kg)  04/25/21 209 lb 3.2 oz (94.9 kg)  01/17/21 200 lb (90.7 kg)     Health Maintenance Due  Topic Date Due   COLON CANCER SCREENING ANNUAL FOBT  Never done   Zoster Vaccines- Shingrix (1 of 2) Never done   Pneumococcal Vaccine 70-25 Years old (2 - PCV) 08/16/2020   OPHTHALMOLOGY EXAM  05/03/2021   FOOT EXAM  10/11/2021    There are no preventive care reminders to display for this patient.  Lab Results  Component Value Date   TSH 0.992 10/31/2016   Lab Results  Component Value Date   WBC 10.4 01/17/2021   HGB 15.0 01/17/2021   HCT 45.4 01/17/2021   MCV 86 01/17/2021   PLT 194 01/17/2021   Lab Results  Component Value Date   NA 138 01/17/2021   K 3.9 01/17/2021   CO2 24 01/17/2021   GLUCOSE 95 01/17/2021   BUN 18 01/17/2021   CREATININE 1.28 (H) 01/17/2021   BILITOT 0.3 01/17/2021   ALKPHOS 83 01/17/2021   AST 22 01/17/2021   ALT 21 01/17/2021   PROT 7.9 01/17/2021   ALBUMIN 4.7 01/17/2021   CALCIUM 9.5 01/17/2021   ANIONGAP 8 05/31/2017   Lab Results  Component Value Date   CHOL 112 01/17/2021   Lab Results  Component Value Date   HDL 42 01/17/2021   Lab Results  Component Value Date   LDLCALC 56 01/17/2021   Lab Results  Component Value Date   TRIG 67 01/17/2021   Lab Results  Component Value Date   CHOLHDL 2.7 01/17/2021   Lab Results  Component Value Date   HGBA1C 7.7 (A) 10/17/2021      Assessment & Plan:    Problem List Items Addressed This Visit       Cardiovascular and Mediastinum   Essential hypertension (Chronic)    Hypertension not at goal patient's been missing medications and eating quite a bit of excess salt in the diet  Continue current medications as prescribed bring the patient back for short-term follow-up with improved dietary habits hopefully this blood pressure will lower      Relevant Medications   amLODipine (NORVASC) 10 MG tablet   aspirin EC 81 MG tablet   atorvastatin (LIPITOR) 20 MG tablet   carvedilol (COREG) 25 MG tablet   tadalafil (CIALIS) 10 MG tablet   valsartan-hydrochlorothiazide (DIOVAN-HCT) 320-25 MG tablet     Endocrine   DM (diabetes mellitus), type 2 with neurological complications ([place]) - Primary    Type 2 diabetes with neuropathy A1c is increased  Will increase Trulicity to 1.5 mg weekly continue metformin as prescribed      Relevant Medications   aspirin EC 81 MG tablet   atorvastatin (LIPITOR) 20 MG tablet   insulin glargine (LANTUS SOLOSTAR) 100 UNIT/ML Solostar Pen   metFORMIN (GLUCOPHAGE) 1000 MG tablet   valsartan-hydrochlorothiazide (DIOVAN-HCT) 320-25 MG tablet   Dulaglutide (TRULICITY) 1.5 XQ/1.1HE SOPN   Other Relevant Orders   HgB A1c (Completed)     Other   Chronic paranoid schizophrenia ([place])    Mental health has stabilized continue current medications      Tobacco use       Current smoking consumption amount: 1 pack a day  Dicsussion on advise to quit smoking and smoking impacts: Impacts of cardiovascular health  Patient's willingness to quit: Not ready quit  Methods to quit smoking discussed: Behavioral modification  Medication management of smoking session drugs discussed: None  Resources provided:  AVS   Setting quit date not established  Follow-up arranged 62month   Time spent counseling the patient: 5 minutes ...      Other Visit Diagnoses     Type 2 diabetes mellitus with hyperosmolarity  without coma, with long-term current use of insulin (HCC)       Relevant Medications   aspirin EC 81 MG tablet   atorvastatin (LIPITOR) 20 MG tablet   Blood Glucose Monitoring Suppl (TRUE METRIX METER) w/Device KIT   insulin glargine (LANTUS SOLOSTAR) 100 UNIT/ML Solostar Pen   metFORMIN (GLUCOPHAGE) 1000 MG tablet   TRUEplus Lancets 28G MISC   valsartan-hydrochlorothiazide (DIOVAN-HCT) 320-25 MG tablet   Dulaglutide (TRULICITY) 1.5 MRD/4.0CXSOPN   Colon cancer screening       Relevant Orders   Fecal occult blood, imunochemical   Need for immunization against influenza       Relevant Orders   Flu Vaccine QUAD 637moM (Fluarix, Fluzone & Alfiuria Quad PF) (Completed)       Meds ordered this encounter  Medications   amLODipine (NORVASC) 10 MG tablet    Sig: Take 1 tablet (10 mg total) by mouth daily.    Dispense:  90 tablet    Refill:  3   aspirin EC 81 MG tablet    Sig: Take 1 tablet (  81 mg total) by mouth daily.    Dispense:  100 tablet    Refill:  1   atorvastatin (LIPITOR) 20 MG tablet    Sig: Take 1 tablet (20 mg total) by mouth daily. Reported on 05/01/2016    Dispense:  90 tablet    Refill:  3   BD ULTRA-FINE PEN NEEDLES 29G X 12.7MM MISC    Sig: Use with insulin pen    Dispense:  100 each    Refill:  1   Blood Glucose Monitoring Suppl (TRUE METRIX METER) w/Device KIT    Sig: Use as instructed to check blood sugar daily.    Dispense:  1 kit    Refill:  0   carvedilol (COREG) 25 MG tablet    Sig: Take 1 tablet (25 mg total) by mouth 2 (two) times daily with a meal.    Dispense:  120 tablet    Refill:  4   DULoxetine (CYMBALTA) 60 MG capsule    Sig: Take 1 capsule (60 mg total) by mouth daily.    Dispense:  90 capsule    Refill:  3   insulin glargine (LANTUS SOLOSTAR) 100 UNIT/ML Solostar Pen    Sig: INJECT 50 UNITS INTO SKIN AT BEDTIME    Dispense:  15 mL    Refill:  4   metFORMIN (GLUCOPHAGE) 1000 MG tablet    Sig: TAKE ONE TABLET BY MOUTH TWICE A DAY DAILY  WITH A MEAL    Dispense:  180 tablet    Refill:  2   OLANZapine (ZYPREXA) 15 MG tablet    Sig: Take 1 tablet (15 mg total) by mouth at bedtime.    Dispense:  30 tablet    Refill:  4   tadalafil (CIALIS) 10 MG tablet    Sig: Take 1 tablet (10 mg total) by mouth daily as needed for erectile dysfunction.    Dispense:  10 tablet    Refill:  3   TRUEplus Lancets 28G MISC    Sig: Use as instructed to check blood sugar daily.    Dispense:  100 each    Refill:  11   valsartan-hydrochlorothiazide (DIOVAN-HCT) 320-25 MG tablet    Sig: Take 1 tablet by mouth daily.    Dispense:  90 tablet    Refill:  3   Dulaglutide (TRULICITY) 1.5 HC/6.2BJ SOPN    Sig: Inject 1.5 mg into the skin once a week.    Dispense:  2 mL    Refill:  4    Follow-up: Return in about 2 months (around 12/17/2021).    Asencion Noble, MD

## 2021-10-17 ENCOUNTER — Other Ambulatory Visit: Payer: Self-pay

## 2021-10-17 ENCOUNTER — Encounter: Payer: Self-pay | Admitting: Critical Care Medicine

## 2021-10-17 ENCOUNTER — Ambulatory Visit: Payer: Medicaid Other | Attending: Critical Care Medicine | Admitting: Critical Care Medicine

## 2021-10-17 VITALS — BP 164/99 | HR 64 | Resp 16 | Wt 220.8 lb

## 2021-10-17 DIAGNOSIS — Z794 Long term (current) use of insulin: Secondary | ICD-10-CM

## 2021-10-17 DIAGNOSIS — Z23 Encounter for immunization: Secondary | ICD-10-CM | POA: Diagnosis not present

## 2021-10-17 DIAGNOSIS — Z720 Tobacco use: Secondary | ICD-10-CM

## 2021-10-17 DIAGNOSIS — I10 Essential (primary) hypertension: Secondary | ICD-10-CM

## 2021-10-17 DIAGNOSIS — E1100 Type 2 diabetes mellitus with hyperosmolarity without nonketotic hyperglycemic-hyperosmolar coma (NKHHC): Secondary | ICD-10-CM

## 2021-10-17 DIAGNOSIS — F200 Paranoid schizophrenia: Secondary | ICD-10-CM

## 2021-10-17 DIAGNOSIS — Z1211 Encounter for screening for malignant neoplasm of colon: Secondary | ICD-10-CM

## 2021-10-17 DIAGNOSIS — E1149 Type 2 diabetes mellitus with other diabetic neurological complication: Secondary | ICD-10-CM

## 2021-10-17 DIAGNOSIS — F17210 Nicotine dependence, cigarettes, uncomplicated: Secondary | ICD-10-CM

## 2021-10-17 LAB — POCT GLYCOSYLATED HEMOGLOBIN (HGB A1C): HbA1c, POC (controlled diabetic range): 7.7 % — AB (ref 0.0–7.0)

## 2021-10-17 MED ORDER — TRUEPLUS LANCETS 28G MISC: 11 refills | Status: DC

## 2021-10-17 MED ORDER — VALSARTAN-HYDROCHLOROTHIAZIDE 320-25 MG PO TABS: 1.0000 | ORAL_TABLET | Freq: Every day | ORAL | 3 refills | Status: DC

## 2021-10-17 MED ORDER — OLANZAPINE 15 MG PO TABS: 15.0000 mg | ORAL_TABLET | Freq: Every day | ORAL | 4 refills | Status: DC

## 2021-10-17 MED ORDER — BD PEN NEEDLE ORIGINAL U/F 29G X 12.7MM MISC: 1 refills | Status: DC

## 2021-10-17 MED ORDER — METFORMIN HCL 1000 MG PO TABS: ORAL_TABLET | ORAL | 2 refills | Status: DC

## 2021-10-17 MED ORDER — DULOXETINE HCL 60 MG PO CPEP: 60.0000 mg | ORAL_CAPSULE | Freq: Every day | ORAL | 3 refills | Status: DC

## 2021-10-17 MED ORDER — TADALAFIL 10 MG PO TABS: 10.0000 mg | ORAL_TABLET | Freq: Every day | ORAL | 3 refills | Status: DC | PRN

## 2021-10-17 MED ORDER — AMLODIPINE BESYLATE 10 MG PO TABS: 10.0000 mg | ORAL_TABLET | Freq: Every day | ORAL | 3 refills | Status: DC

## 2021-10-17 MED ORDER — TRUE METRIX METER W/DEVICE KIT: PACK | 0 refills | Status: DC

## 2021-10-17 MED ORDER — ATORVASTATIN CALCIUM 20 MG PO TABS: 20.0000 mg | ORAL_TABLET | Freq: Every day | ORAL | 3 refills | Status: DC

## 2021-10-17 MED ORDER — CARVEDILOL 25 MG PO TABS: 25.0000 mg | ORAL_TABLET | Freq: Two times a day (BID) | ORAL | 4 refills | Status: DC

## 2021-10-17 MED ORDER — TRULICITY 1.5 MG/0.5ML ~~LOC~~ SOAJ: 1.5000 mg | SUBCUTANEOUS | 4 refills | Status: DC

## 2021-10-17 MED ORDER — LANTUS SOLOSTAR 100 UNIT/ML ~~LOC~~ SOPN: PEN_INJECTOR | SUBCUTANEOUS | 4 refills | Status: DC

## 2021-10-17 MED ORDER — ASPIRIN EC 81 MG PO TBEC: 81.0000 mg | DELAYED_RELEASE_TABLET | Freq: Every day | ORAL | 1 refills | Status: DC

## 2021-10-17 NOTE — Assessment & Plan Note (Signed)
Mental health has stabilized continue current medications

## 2021-10-17 NOTE — Assessment & Plan Note (Addendum)
  .   Current smoking consumption amount: 1 pack a day  . Dicsussion on advise to quit smoking and smoking impacts: Impacts of cardiovascular health  . Patient's willingness to quit: Not ready quit  . Methods to quit smoking discussed: Behavioral modification  . Medication management of smoking session drugs discussed: None  . Resources provided:  AVS   . Setting quit date not established  . Follow-up arranged 57months   Time spent counseling the patient: 5 minutes .Marland KitchenMarland Kitchen

## 2021-10-17 NOTE — Progress Notes (Signed)
Patient also states he is having left side pain

## 2021-10-17 NOTE — Assessment & Plan Note (Signed)
Type 2 diabetes with neuropathy A1c is increased  Will increase Trulicity to 1.5 mg weekly continue metformin as prescribed

## 2021-10-17 NOTE — Patient Instructions (Signed)
Increase Trulicity to 1.5 mg weekly a new prescription will be issued  No change in other medications refill sent to your [facility] for delivery  Follow a low-salt diet see attached diet per our discussion  Dental resources that take Medicaid were given to you please schedule a dental exam to have some of the teeth that are fractured removed and other dental care to occur  Flu vaccine was given  A new colon cancer screening fecal occult kit was given today process this the day before you come to see me again in 2 months and bring it with you  Return to see Dr. Joya Gaskins 2 months

## 2021-10-17 NOTE — Assessment & Plan Note (Signed)
Hypertension not at goal patient's been missing medications and eating quite a bit of excess salt in the diet  Continue current medications as prescribed bring the patient back for short-term follow-up with improved dietary habits hopefully this blood pressure will lower

## 2021-11-29 ENCOUNTER — Ambulatory Visit: Payer: Medicaid Other | Attending: Critical Care Medicine | Admitting: Critical Care Medicine

## 2021-11-29 ENCOUNTER — Encounter: Payer: Self-pay | Admitting: Critical Care Medicine

## 2021-11-29 ENCOUNTER — Other Ambulatory Visit: Payer: Self-pay

## 2021-11-29 VITALS — BP 132/85 | HR 76 | Resp 16 | Wt 213.4 lb

## 2021-11-29 DIAGNOSIS — E119 Type 2 diabetes mellitus without complications: Secondary | ICD-10-CM

## 2021-11-29 DIAGNOSIS — I10 Essential (primary) hypertension: Secondary | ICD-10-CM | POA: Diagnosis not present

## 2021-11-29 DIAGNOSIS — F17210 Nicotine dependence, cigarettes, uncomplicated: Secondary | ICD-10-CM

## 2021-11-29 DIAGNOSIS — Z794 Long term (current) use of insulin: Secondary | ICD-10-CM | POA: Diagnosis not present

## 2021-11-29 DIAGNOSIS — E1100 Type 2 diabetes mellitus with hyperosmolarity without nonketotic hyperglycemic-hyperosmolar coma (NKHHC): Secondary | ICD-10-CM

## 2021-11-29 DIAGNOSIS — Z1211 Encounter for screening for malignant neoplasm of colon: Secondary | ICD-10-CM

## 2021-11-29 DIAGNOSIS — Z720 Tobacco use: Secondary | ICD-10-CM

## 2021-11-29 LAB — GLUCOSE, POCT (MANUAL RESULT ENTRY): POC Glucose: 154 mg/dl — AB (ref 70–99)

## 2021-11-29 MED ORDER — AMLODIPINE BESYLATE 10 MG PO TABS: 10.0000 mg | ORAL_TABLET | Freq: Every day | ORAL | 3 refills | Status: DC

## 2021-11-29 MED ORDER — OLANZAPINE 15 MG PO TABS: 15.0000 mg | ORAL_TABLET | Freq: Every day | ORAL | 4 refills | Status: DC

## 2021-11-29 MED ORDER — TRULICITY 1.5 MG/0.5ML ~~LOC~~ SOAJ: 1.5000 mg | SUBCUTANEOUS | 4 refills | Status: DC

## 2021-11-29 MED ORDER — METFORMIN HCL 1000 MG PO TABS: ORAL_TABLET | ORAL | 2 refills | Status: DC

## 2021-11-29 MED ORDER — LANTUS SOLOSTAR 100 UNIT/ML ~~LOC~~ SOPN: PEN_INJECTOR | SUBCUTANEOUS | 4 refills | Status: DC

## 2021-11-29 MED ORDER — VALSARTAN-HYDROCHLOROTHIAZIDE 320-25 MG PO TABS: 1.0000 | ORAL_TABLET | Freq: Every day | ORAL | 3 refills | Status: DC

## 2021-11-29 MED ORDER — ATORVASTATIN CALCIUM 20 MG PO TABS: 20.0000 mg | ORAL_TABLET | Freq: Every day | ORAL | 3 refills | Status: DC

## 2021-11-29 MED ORDER — CARVEDILOL 25 MG PO TABS: 25.0000 mg | ORAL_TABLET | Freq: Two times a day (BID) | ORAL | 4 refills | Status: DC

## 2021-11-29 MED ORDER — BD PEN NEEDLE ORIGINAL U/F 29G X 12.7MM MISC: 1 refills | Status: DC

## 2021-11-29 MED ORDER — DULOXETINE HCL 60 MG PO CPEP: 60.0000 mg | ORAL_CAPSULE | Freq: Every day | ORAL | 3 refills | Status: DC

## 2021-11-29 NOTE — Patient Instructions (Signed)
Complete set of screening labs were obtained today along with issuing you a colon cancer screening kit  Focus on smoking cessation see attachment  Refills on all medications sent to [facility] declined the pneumonia vaccine  See attachment for back exercises with your back pain  Please get your eyes checked this coming year we gave you eye resource sheet to go to one of the sites listed  Please check your blood sugars least twice daily  Return to Dr. Joya Gaskins in 2 months  We sent the clearance for you to get your teeth pulled to the dentist

## 2021-11-29 NOTE — Progress Notes (Signed)
Established Patient Office Visit  Subjective:  Patient ID: [name], male    DOB: [birth year]  Age: 60 y.o. MRN: [id number]  CC:  Chief Complaint  Patient presents with   Hypertension    HPI [name] presents for primary care follow-up.  On arrival blood sugar is 154 blood pressure 132/85.  He is been out of his blood pressure medicines for several days does need a refill on his Trulicity as well.  He does complain of some upper left-sided back pain after lifting heavy objects.  He now has an apartment in [place] living with shelter.  He does need colon cancer screening at this time.  He has no other complaints.     Past Medical History:  Diagnosis Date   COVID-19 virus infection 01/20/2020   Dental abscess 10/11/2020   Diabetes mellitus    Diabetic neuropathy (HCC)    High cholesterol    Hypertension    Schizophrenia ([place])    Toenail fungus 08/17/2019    Past Surgical History:  Procedure Laterality Date   TONSILLECTOMY      Family History  Problem Relation Age of Onset   Hypertension Mother    Cancer Father    Alcoholism Other     Social History   Socioeconomic History   Marital status: Single    Spouse name: Not on file   Number of children: Not on file   Years of education: Not on file   Highest education level: Not on file  Occupational History   Not on file  Tobacco Use   Smoking status: Every Day    Packs/day: 0.50    Types: Cigarettes   Smokeless tobacco: Never  Vaping Use   Vaping Use: Never used  Substance and Sexual Activity   Alcohol use: No   Drug use: No   Sexual activity: Yes  Other Topics Concern   Not on file  Social History Narrative   Not on file   Social Determinants of Health   Financial Resource Strain: Not on file  Food Insecurity: Not on file  Transportation Needs: Not on file  Physical Activity: Not on file  Stress: Not on file  Social Connections: Not on file  Intimate Partner Violence: Not on file     Outpatient Medications Prior to Visit  Medication Sig Dispense Refill   albuterol (PROAIR HFA) 108 (90 Base) MCG/ACT inhaler INHALE TWO PUFFS BY MOUTH INTO THE LUNGS EVERY 4 HOURS AS NEEDED FOR WHEEZING OR SHORTNESS OF BREATH 8.5 g 1   aspirin EC 81 MG tablet Take 1 tablet (81 mg total) by mouth daily. 100 tablet 1   Blood Glucose Monitoring Suppl (TRUE METRIX METER) w/Device KIT Use as instructed to check blood sugar daily. 1 kit 0   glucose blood (TRUE METRIX BLOOD GLUCOSE TEST) test strip Use as instructed to check blood sugar daily. 100 each 12   tadalafil (CIALIS) 10 MG tablet Take 1 tablet (10 mg total) by mouth daily as needed for erectile dysfunction. 10 tablet 3   TRUEplus Lancets 28G MISC Use as instructed to check blood sugar daily. 100 each 11   amLODipine (NORVASC) 10 MG tablet Take 1 tablet (10 mg total) by mouth daily. 90 tablet 3   atorvastatin (LIPITOR) 20 MG tablet Take 1 tablet (20 mg total) by mouth daily. Reported on 05/01/2016 90 tablet 3   BD ULTRA-FINE PEN NEEDLES 29G X 12.7MM MISC Use with insulin pen 100 each 1   carvedilol (COREG) 25  MG tablet Take 1 tablet (25 mg total) by mouth 2 (two) times daily with a meal. 120 tablet 4   Dulaglutide (TRULICITY) 1.5 UQ/3.3HL SOPN Inject 1.5 mg into the skin once a week. 2 mL 4   DULoxetine (CYMBALTA) 60 MG capsule Take 1 capsule (60 mg total) by mouth daily. 90 capsule 3   insulin glargine (LANTUS SOLOSTAR) 100 UNIT/ML Solostar Pen INJECT 50 UNITS INTO SKIN AT BEDTIME 15 mL 4   metFORMIN (GLUCOPHAGE) 1000 MG tablet TAKE ONE TABLET BY MOUTH TWICE A DAY DAILY WITH A MEAL 180 tablet 2   OLANZapine (ZYPREXA) 15 MG tablet Take 1 tablet (15 mg total) by mouth at bedtime. 30 tablet 4   valsartan-hydrochlorothiazide (DIOVAN-HCT) 320-25 MG tablet Take 1 tablet by mouth daily. 90 tablet 3   No facility-administered medications prior to visit.    Allergies  Allergen Reactions   Benadryl [Diphenhydramine Hcl]    Diphenhydramine      GI upset, paradoxic agitation   Haldol [Haloperidol Lactate]    Haldol [Haloperidol]     "makes me tremble, bite my tongue"   Lisinopril Swelling   Thorazine [Chlorpromazine]     Tremble, bite my tongue   Thorazine [Chlorpromazine]     ROS Review of Systems  Constitutional: Negative.  Negative for chills, diaphoresis and fever.  HENT:  Positive for dental problem. Negative for congestion, ear pain, hearing loss, nosebleeds, postnasal drip, rhinorrhea, sinus pressure, sore throat, tinnitus, trouble swallowing and voice change.   Eyes: Negative.  Negative for photophobia and redness.  Respiratory: Negative.  Negative for apnea, cough, choking, chest tightness, shortness of breath, wheezing and stridor.   Cardiovascular: Negative.  Negative for chest pain, palpitations and leg swelling.  Gastrointestinal: Negative.  Negative for abdominal distention, abdominal pain, blood in stool, constipation, diarrhea, nausea and vomiting.  Endocrine: Negative for polydipsia.  Genitourinary: Negative.  Negative for dysuria, flank pain, frequency, hematuria and urgency.  Musculoskeletal:  Positive for back pain. Negative for arthralgias, myalgias and neck pain.  Skin: Negative.  Negative for rash.  Allergic/Immunologic: Negative.  Negative for environmental allergies and food allergies.  Neurological: Negative.  Negative for dizziness, tremors, seizures, syncope, weakness and headaches.  Hematological: Negative.  Negative for adenopathy. Does not bruise/bleed easily.  Psychiatric/Behavioral:  Positive for decreased concentration. Negative for agitation, dysphoric mood, sleep disturbance and suicidal ideas. The patient is not nervous/anxious.      Objective:    Physical Exam Vitals reviewed.  Constitutional:      Appearance: Normal appearance. He is well-developed. He is not diaphoretic.  HENT:     Head: Normocephalic and atraumatic.     Nose: No nasal deformity, septal deviation, mucosal edema or  rhinorrhea.     Right Sinus: No maxillary sinus tenderness or frontal sinus tenderness.     Left Sinus: No maxillary sinus tenderness or frontal sinus tenderness.     Mouth/Throat:     Pharynx: No oropharyngeal exudate.  Eyes:     General: No scleral icterus.    Conjunctiva/sclera: Conjunctivae normal.     Pupils: Pupils are equal, round, and reactive to light.  Neck:     Thyroid: No thyromegaly.     Vascular: No carotid bruit or JVD.     Trachea: Trachea normal. No tracheal tenderness or tracheal deviation.  Cardiovascular:     Rate and Rhythm: Normal rate and regular rhythm.     Chest Wall: PMI is not displaced.     Pulses: Normal pulses. No decreased pulses.  Heart sounds: Normal heart sounds, S1 normal and S2 normal. Heart sounds not distant. No murmur heard. No systolic murmur is present.  No diastolic murmur is present.    No friction rub. No gallop. No S3 or S4 sounds.  Pulmonary:     Effort: No tachypnea, accessory muscle usage or respiratory distress.     Breath sounds: No stridor. No decreased breath sounds, wheezing, rhonchi or rales.  Chest:     Chest wall: No tenderness.  Abdominal:     General: Bowel sounds are normal. There is no distension.     Palpations: Abdomen is soft. Abdomen is not rigid.     Tenderness: There is no abdominal tenderness. There is no guarding or rebound.  Musculoskeletal:        General: Tenderness present. Normal range of motion.     Cervical back: Normal range of motion and neck supple. No edema, erythema or rigidity. No muscular tenderness. Normal range of motion.     Comments: Posterior left upper back with trapezius muscle spasticity  Lymphadenopathy:     Head:     Right side of head: No submental or submandibular adenopathy.     Left side of head: No submental or submandibular adenopathy.     Cervical: No cervical adenopathy.  Skin:    General: Skin is warm and dry.     Coloration: Skin is not pale.     Findings: No rash.      Nails: There is no clubbing.  Neurological:     Mental Status: He is alert and oriented to person, place, and time.     Sensory: No sensory deficit.  Psychiatric:        Mood and Affect: Mood normal.        Speech: Speech normal.        Behavior: Behavior normal.    BP 132/85   Pulse 76   Resp 16   Wt 213 lb 6.4 oz (96.8 kg)   SpO2 95%   BMI 29.76 kg/m  Wt Readings from Last 3 Encounters:  11/29/21 213 lb 6.4 oz (96.8 kg)  10/17/21 220 lb 12.8 oz (100.2 kg)  04/25/21 209 lb 3.2 oz (94.9 kg)     Health Maintenance Due  Topic Date Due   COLON CANCER SCREENING ANNUAL FOBT  Never done   Zoster Vaccines- Shingrix (1 of 2) Never done   Pneumococcal Vaccine 67-103 Years old (2 - PCV) 08/16/2020   OPHTHALMOLOGY EXAM  05/03/2021   FOOT EXAM  10/11/2021    There are no preventive care reminders to display for this patient.  Lab Results  Component Value Date   TSH 0.992 10/31/2016   Lab Results  Component Value Date   WBC 10.4 01/17/2021   HGB 15.0 01/17/2021   HCT 45.4 01/17/2021   MCV 86 01/17/2021   PLT 194 01/17/2021   Lab Results  Component Value Date   NA 138 01/17/2021   K 3.9 01/17/2021   CO2 24 01/17/2021   GLUCOSE 95 01/17/2021   BUN 18 01/17/2021   CREATININE 1.28 (H) 01/17/2021   BILITOT 0.3 01/17/2021   ALKPHOS 83 01/17/2021   AST 22 01/17/2021   ALT 21 01/17/2021   PROT 7.9 01/17/2021   ALBUMIN 4.7 01/17/2021   CALCIUM 9.5 01/17/2021   ANIONGAP 8 05/31/2017   Lab Results  Component Value Date   CHOL 112 01/17/2021   Lab Results  Component Value Date   HDL 42 01/17/2021   Lab  Results  Component Value Date   LDLCALC 56 01/17/2021   Lab Results  Component Value Date   TRIG 67 01/17/2021   Lab Results  Component Value Date   CHOLHDL 2.7 01/17/2021   Lab Results  Component Value Date   HGBA1C 7.7 (A) 10/17/2021      Assessment & Plan:   Problem List Items Addressed This Visit       Cardiovascular and Mediastinum   Essential  hypertension (Chronic)    Hypertension stable at this time refill all medications      Relevant Medications   amLODipine (NORVASC) 10 MG tablet   atorvastatin (LIPITOR) 20 MG tablet   carvedilol (COREG) 25 MG tablet   valsartan-hydrochlorothiazide (DIOVAN-HCT) 320-25 MG tablet   Other Relevant Orders   CBC with Differential/Platelet     Endocrine   Controlled type 2 diabetes mellitus ([place])    Diabetes reasonably controlled at this time refill Trulicity continue metformin      Relevant Medications   atorvastatin (LIPITOR) 20 MG tablet   Dulaglutide (TRULICITY) 1.5 KV/4.2VZ SOPN   insulin glargine (LANTUS SOLOSTAR) 100 UNIT/ML Solostar Pen   metFORMIN (GLUCOPHAGE) 1000 MG tablet   valsartan-hydrochlorothiazide (DIOVAN-HCT) 320-25 MG tablet     Other   Tobacco use       Current smoking consumption amount: 1 pack a day  Dicsussion on advise to quit smoking and smoking impacts: Impacts of cardiovascular health  Patient's willingness to quit: Not ready quit  Methods to quit smoking discussed: Behavioral modification  Medication management of smoking session drugs discussed: None  Resources provided:  AVS   Setting quit date not established  Follow-up arranged 48month   Time spent counseling the patient: 5 minutes ...      Other Visit Diagnoses     Type 2 diabetes mellitus with hyperosmolarity without coma, with long-term current use of insulin (HCC)    -  Primary   Relevant Medications   atorvastatin (LIPITOR) 20 MG tablet   Dulaglutide (TRULICITY) 1.5 MDG/3.8VFSOPN   insulin glargine (LANTUS SOLOSTAR) 100 UNIT/ML Solostar Pen   metFORMIN (GLUCOPHAGE) 1000 MG tablet   valsartan-hydrochlorothiazide (DIOVAN-HCT) 320-25 MG tablet   Other Relevant Orders   POCT glucose (manual entry) (Completed)   Comprehensive metabolic panel   Lipid panel   Hemoglobin A1c   Colon cancer screening       Relevant Orders   Fecal occult blood, imunochemical       Meds  ordered this encounter  Medications   amLODipine (NORVASC) 10 MG tablet    Sig: Take 1 tablet (10 mg total) by mouth daily.    Dispense:  90 tablet    Refill:  3   atorvastatin (LIPITOR) 20 MG tablet    Sig: Take 1 tablet (20 mg total) by mouth daily. Reported on 05/01/2016    Dispense:  90 tablet    Refill:  3   BD ULTRA-FINE PEN NEEDLES 29G X 12.7MM MISC    Sig: Use with insulin pen    Dispense:  100 each    Refill:  1   carvedilol (COREG) 25 MG tablet    Sig: Take 1 tablet (25 mg total) by mouth 2 (two) times daily with a meal.    Dispense:  120 tablet    Refill:  4   Dulaglutide (TRULICITY) 1.5 MIE/3.3IRSOPN    Sig: Inject 1.5 mg into the skin once a week.    Dispense:  2 mL    Refill:  4  DULoxetine (CYMBALTA) 60 MG capsule    Sig: Take 1 capsule (60 mg total) by mouth daily.    Dispense:  90 capsule    Refill:  3   insulin glargine (LANTUS SOLOSTAR) 100 UNIT/ML Solostar Pen    Sig: INJECT 50 UNITS INTO SKIN AT BEDTIME    Dispense:  15 mL    Refill:  4   metFORMIN (GLUCOPHAGE) 1000 MG tablet    Sig: TAKE ONE TABLET BY MOUTH TWICE A DAY DAILY WITH A MEAL    Dispense:  180 tablet    Refill:  2   OLANZapine (ZYPREXA) 15 MG tablet    Sig: Take 1 tablet (15 mg total) by mouth at bedtime.    Dispense:  30 tablet    Refill:  4   valsartan-hydrochlorothiazide (DIOVAN-HCT) 320-25 MG tablet    Sig: Take 1 tablet by mouth daily.    Dispense:  90 tablet    Refill:  3    Follow-up: Return in about 2 months (around 01/30/2022).    Asencion Noble, MD

## 2021-11-29 NOTE — Assessment & Plan Note (Signed)
Hypertension stable at this time refill all medications

## 2021-11-29 NOTE — Assessment & Plan Note (Signed)
Diabetes reasonably controlled at this time refill Trulicity continue metformin

## 2021-11-29 NOTE — Assessment & Plan Note (Signed)
  .   Current smoking consumption amount: 1 pack a day  . Dicsussion on advise to quit smoking and smoking impacts: Impacts of cardiovascular health  . Patient's willingness to quit: Not ready quit  . Methods to quit smoking discussed: Behavioral modification  . Medication management of smoking session drugs discussed: None  . Resources provided:  AVS   . Setting quit date not established  . Follow-up arranged 57months   Time spent counseling the patient: 5 minutes .Marland KitchenMarland Kitchen

## 2021-11-30 ENCOUNTER — Telehealth: Payer: Self-pay

## 2021-11-30 LAB — CBC WITH DIFFERENTIAL/PLATELET
Basophils Absolute: 0 10*3/uL (ref 0.0–0.2)
Basos: 0 %
EOS (ABSOLUTE): 0.6 10*3/uL — ABNORMAL HIGH (ref 0.0–0.4)
Eos: 6 %
Hematocrit: 44.9 % (ref 37.5–51.0)
Hemoglobin: 14.7 g/dL (ref 13.0–17.7)
Immature Grans (Abs): 0.1 10*3/uL (ref 0.0–0.1)
Immature Granulocytes: 1 %
Lymphocytes Absolute: 1.9 10*3/uL (ref 0.7–3.1)
Lymphs: 20 %
MCH: 29.3 pg (ref 26.6–33.0)
MCHC: 32.7 g/dL (ref 31.5–35.7)
MCV: 89 fL (ref 79–97)
Monocytes Absolute: 1 10*3/uL — ABNORMAL HIGH (ref 0.1–0.9)
Monocytes: 10 %
Neutrophils Absolute: 6.1 10*3/uL (ref 1.4–7.0)
Neutrophils: 63 %
Platelets: 201 10*3/uL (ref 150–450)
RBC: 5.02 x10E6/uL (ref 4.14–5.80)
RDW: 13.8 % (ref 11.6–15.4)
WBC: 9.6 10*3/uL (ref 3.4–10.8)

## 2021-11-30 LAB — COMPREHENSIVE METABOLIC PANEL
ALT: 19 IU/L (ref 0–44)
AST: 23 IU/L (ref 0–40)
Albumin/Globulin Ratio: 1.6 (ref 1.2–2.2)
Albumin: 4.5 g/dL (ref 3.8–4.9)
Alkaline Phosphatase: 68 IU/L (ref 44–121)
BUN/Creatinine Ratio: 19 (ref 10–24)
BUN: 26 mg/dL (ref 8–27)
Bilirubin Total: 0.3 mg/dL (ref 0.0–1.2)
CO2: 24 mmol/L (ref 20–29)
Calcium: 9.4 mg/dL (ref 8.6–10.2)
Chloride: 100 mmol/L (ref 96–106)
Creatinine, Ser: 1.34 mg/dL — ABNORMAL HIGH (ref 0.76–1.27)
Globulin, Total: 2.8 g/dL (ref 1.5–4.5)
Glucose: 117 mg/dL — ABNORMAL HIGH (ref 70–99)
Potassium: 4.4 mmol/L (ref 3.5–5.2)
Sodium: 137 mmol/L (ref 134–144)
Total Protein: 7.3 g/dL (ref 6.0–8.5)
eGFR: 61 mL/min/{1.73_m2} (ref >59)

## 2021-11-30 LAB — LIPID PANEL
Chol/HDL Ratio: 2.5 ratio (ref 0.0–5.0)
Cholesterol, Total: 86 mg/dL — ABNORMAL LOW (ref 100–199)
HDL: 35 mg/dL — ABNORMAL LOW (ref >39)
LDL Chol Calc (NIH): 37 mg/dL (ref 0–99)
Triglycerides: 63 mg/dL (ref 0–149)
VLDL Cholesterol Cal: 14 mg/dL (ref 5–40)

## 2021-11-30 LAB — HEMOGLOBIN A1C
Est. average glucose Bld gHb Est-mCnc: 154 mg/dL
Hgb A1c MFr Bld: 7 % — ABNORMAL HIGH (ref 4.8–5.6)

## 2021-11-30 NOTE — Telephone Encounter (Signed)
-----   Message from Storm Frisk, MD sent at 11/30/2021  6:01 AM EST ----- Let the patient know kidney and liver stable/normal,  cholesterol at goal, blood counts normal, his A1C is at goal 7.0  no change in medications

## 2021-11-30 NOTE — Telephone Encounter (Signed)
Pt was called and vm was left, Information has been sent to nurse pool.   

## 2021-12-11 ENCOUNTER — Other Ambulatory Visit: Payer: Self-pay | Admitting: Critical Care Medicine

## 2021-12-19 ENCOUNTER — Ambulatory Visit: Payer: Medicaid Other | Admitting: Critical Care Medicine

## 2022-01-30 ENCOUNTER — Ambulatory Visit: Payer: Medicaid Other | Admitting: Critical Care Medicine

## 2022-01-31 ENCOUNTER — Ambulatory Visit: Payer: Medicaid Other | Attending: Critical Care Medicine | Admitting: Critical Care Medicine

## 2022-01-31 ENCOUNTER — Encounter: Payer: Self-pay | Admitting: Critical Care Medicine

## 2022-01-31 VITALS — BP 114/78 | HR 59 | Resp 16 | Wt 218.8 lb

## 2022-01-31 DIAGNOSIS — I10 Essential (primary) hypertension: Secondary | ICD-10-CM

## 2022-01-31 DIAGNOSIS — Z1211 Encounter for screening for malignant neoplasm of colon: Secondary | ICD-10-CM

## 2022-01-31 DIAGNOSIS — Z09 Encounter for follow-up examination after completed treatment for conditions other than malignant neoplasm: Secondary | ICD-10-CM | POA: Diagnosis not present

## 2022-01-31 DIAGNOSIS — Z7901 Long term (current) use of anticoagulants: Secondary | ICD-10-CM | POA: Diagnosis not present

## 2022-01-31 DIAGNOSIS — Z720 Tobacco use: Secondary | ICD-10-CM

## 2022-01-31 DIAGNOSIS — Z79899 Other long term (current) drug therapy: Secondary | ICD-10-CM | POA: Diagnosis not present

## 2022-01-31 DIAGNOSIS — F17210 Nicotine dependence, cigarettes, uncomplicated: Secondary | ICD-10-CM | POA: Diagnosis not present

## 2022-01-31 DIAGNOSIS — Z8616 Personal history of COVID-19: Secondary | ICD-10-CM | POA: Insufficient documentation

## 2022-01-31 DIAGNOSIS — R195 Other fecal abnormalities: Secondary | ICD-10-CM | POA: Diagnosis not present

## 2022-01-31 DIAGNOSIS — E1149 Type 2 diabetes mellitus with other diabetic neurological complication: Secondary | ICD-10-CM | POA: Diagnosis not present

## 2022-01-31 DIAGNOSIS — Z794 Long term (current) use of insulin: Secondary | ICD-10-CM | POA: Diagnosis not present

## 2022-01-31 DIAGNOSIS — N529 Male erectile dysfunction, unspecified: Secondary | ICD-10-CM | POA: Insufficient documentation

## 2022-01-31 DIAGNOSIS — E1100 Type 2 diabetes mellitus with hyperosmolarity without nonketotic hyperglycemic-hyperosmolar coma (NKHHC): Secondary | ICD-10-CM | POA: Diagnosis present

## 2022-01-31 DIAGNOSIS — Z7984 Long term (current) use of oral hypoglycemic drugs: Secondary | ICD-10-CM | POA: Diagnosis not present

## 2022-01-31 DIAGNOSIS — F200 Paranoid schizophrenia: Secondary | ICD-10-CM | POA: Diagnosis not present

## 2022-01-31 DIAGNOSIS — N5201 Erectile dysfunction due to arterial insufficiency: Secondary | ICD-10-CM | POA: Diagnosis not present

## 2022-01-31 DIAGNOSIS — R531 Weakness: Secondary | ICD-10-CM | POA: Diagnosis not present

## 2022-01-31 LAB — GLUCOSE, POCT (MANUAL RESULT ENTRY)
POC Glucose: 44 mg/dl — AB (ref 70–99)
POC Glucose: 162 mg/dl — AB (ref 70–99)

## 2022-01-31 MED ORDER — LANTUS SOLOSTAR 100 UNIT/ML ~~LOC~~ SOPN: 30.0000 [IU] | PEN_INJECTOR | Freq: Every day | SUBCUTANEOUS | 4 refills | Status: DC

## 2022-01-31 MED ORDER — SILDENAFIL CITRATE 50 MG PO TABS: 50.0000 mg | ORAL_TABLET | Freq: Every day | ORAL | 0 refills | Status: DC | PRN

## 2022-01-31 MED ORDER — METFORMIN HCL 1000 MG PO TABS: 1000.0000 mg | ORAL_TABLET | Freq: Every day | ORAL | 2 refills | Status: DC

## 2022-01-31 MED ORDER — GLUCOSE-VITAMIN C 4-6 GM-MG PO CHEW: 1.0000 | CHEWABLE_TABLET | Freq: Once | ORAL | Status: AC

## 2022-01-31 NOTE — Assessment & Plan Note (Signed)
Smoking cessation was discussed again with the patient. He is now only smoking 5-6 cigarettes per day.

## 2022-01-31 NOTE — Progress Notes (Signed)
Established Patient Office Visit  Subjective:  Patient ID: [name], male    DOB: 12-28-1960  Age: 61 y.o. MRN: [id number]  CC:  Chief Complaint  Patient presents with   Diabetes    HPI [name] presents for diabetes follow up. He stated that he was feeling weak, and when the CMA took his blood sugar, it was 44 mg/dL. He was given a glucose tablet, orange juice, and goldfish crackers in an attempt to raise his blood glucose. 45 minutes later, his blood sugar had increased to 162 mg/dL. Prior to coming to the office today, he had eaten two eggs around 4 AM.   The patient states that he does not take his blood sugars at home. If he feels weakness like he did upon presentation to the office, he will eat a snack and tends to feel better afterwards. He has made concerted efforts to improve his diet, and eat healthier foods including fruits and vegetables. He reports that he takes his diabetes medications as prescribed, including weekly Trulicity, metformin 8372 mg twice daily, and Lantus insulin, 50 units at bedtime. He reports no other diabetic symptoms, except for his continued neuropathy in the hands and feet.  The patient also has a history of hypertension. He is currently taking amlodipine 10 mg daily, carvedilol 73m twice daily, and valsartan-HCTZ 320-25 daily. His blood pressure was excellent upon measurement in office today, at 114/78 mmHg. He is pleased with this result, as he has been making concerted efforts at lifestyle management.  He has a concern that the Cialis he has been taking for erectile dysfunction is not working. He would like to try a different medication. His other concern is regarding his vision because he is not seeing as well as he used to. He has not been to an ophthalmologist in over a year, and would like an appointment to see one.  He continues to smoke, but has decreased his usage to about 6 cigarettes per day, a decrease from about 1 PPD. He says  that he tends to smoke when he is reading his Bible or writing.  The patient is due for colon cancer screening. An FOBT kit was ordered at his last visit, but he ended up being unable to pick it up. He plans on picking the kit up today after his appointment.  Past Medical History:  Diagnosis Date   COVID-19 virus infection 01/20/2020   Dental abscess 10/11/2020   Diabetes mellitus    Diabetic neuropathy (HCC)    High cholesterol    Hypertension    Schizophrenia ([facility]    Toenail fungus 08/17/2019    Past Surgical History:  Procedure Laterality Date   TONSILLECTOMY      Family History  Problem Relation Age of Onset   Hypertension Mother    Cancer Father    Alcoholism Other     Social History   Socioeconomic History   Marital status: Single    Spouse name: Not on file   Number of children: Not on file   Years of education: Not on file   Highest education level: Not on file  Occupational History   Not on file  Tobacco Use   Smoking status: Every Day    Packs/day: 0.50    Types: Cigarettes   Smokeless tobacco: Never  Vaping Use   Vaping Use: Never used  Substance and Sexual Activity   Alcohol use: No   Drug use: No   Sexual activity: Yes  Other Topics  Concern   Not on file  Social History Narrative   Not on file   Social Determinants of Health   Financial Resource Strain: Not on file  Food Insecurity: Not on file  Transportation Needs: Not on file  Physical Activity: Not on file  Stress: Not on file  Social Connections: Not on file  Intimate Partner Violence: Not on file    Outpatient Medications Prior to Visit  Medication Sig Dispense Refill   albuterol (VENTOLIN HFA) 108 (90 Base) MCG/ACT inhaler INHALE TWO PUFFS BY MOUTH INTO THE LUNGS EVERY 4 HOURS AS NEEDED FOR WHEEZING OR SHORTNESS OF BREATH 18 g 2   amLODipine (NORVASC) 10 MG tablet Take 1 tablet (10 mg total) by mouth daily. 90 tablet 3   aspirin EC 81 MG tablet Take 1 tablet (81 mg total) by  mouth daily. 100 tablet 1   atorvastatin (LIPITOR) 20 MG tablet Take 1 tablet (20 mg total) by mouth daily. Reported on 05/01/2016 90 tablet 3   BD ULTRA-FINE PEN NEEDLES 29G X 12.7MM MISC Use with insulin pen 100 each 1   Blood Glucose Monitoring Suppl (TRUE METRIX METER) w/Device KIT Use as instructed to check blood sugar daily. 1 kit 0   carvedilol (COREG) 25 MG tablet Take 1 tablet (25 mg total) by mouth 2 (two) times daily with a meal. 120 tablet 4   Dulaglutide (TRULICITY) 1.5 ZO/1.0RU SOPN Inject 1.5 mg into the skin once a week. 2 mL 4   DULoxetine (CYMBALTA) 60 MG capsule Take 1 capsule (60 mg total) by mouth daily. 90 capsule 3   glucose blood (TRUE METRIX BLOOD GLUCOSE TEST) test strip Use as instructed to check blood sugar daily. 100 each 12   OLANZapine (ZYPREXA) 15 MG tablet Take 1 tablet (15 mg total) by mouth at bedtime. 30 tablet 4   TRUEplus Lancets 28G MISC Use as instructed to check blood sugar daily. 100 each 11   valsartan-hydrochlorothiazide (DIOVAN-HCT) 320-25 MG tablet Take 1 tablet by mouth daily. 90 tablet 3   insulin glargine (LANTUS SOLOSTAR) 100 UNIT/ML Solostar Pen INJECT 50 UNITS INTO SKIN AT BEDTIME 15 mL 4   metFORMIN (GLUCOPHAGE) 1000 MG tablet TAKE ONE TABLET BY MOUTH TWICE A DAY DAILY WITH A MEAL 180 tablet 2   tadalafil (CIALIS) 10 MG tablet Take 1 tablet (10 mg total) by mouth daily as needed for erectile dysfunction. 10 tablet 3   No facility-administered medications prior to visit.    Allergies  Allergen Reactions   Benadryl [Diphenhydramine Hcl]    Diphenhydramine     GI upset, paradoxic agitation   Haldol [Haloperidol Lactate]    Haldol [Haloperidol]     "makes me tremble, bite my tongue"   Lisinopril Swelling   Thorazine [Chlorpromazine]     Tremble, bite my tongue   Thorazine [Chlorpromazine]     ROS Review of Systems  Constitutional: Negative.  Negative for fatigue.  HENT: Negative.  Negative for ear pain.   Eyes:  Positive for visual  disturbance.  Respiratory:  Negative for chest tightness and shortness of breath.   Cardiovascular:  Negative for chest pain and leg swelling.  Gastrointestinal:  Negative for constipation, diarrhea and nausea.  Genitourinary:  Negative for dysuria and frequency.  Musculoskeletal: Negative.   Skin: Negative.   Neurological:  Positive for numbness. Negative for dizziness and weakness.  Psychiatric/Behavioral:  Negative for dysphoric mood.      Objective:    Physical Exam Constitutional:  Appearance: Normal appearance. He is normal weight.  HENT:     Head: Normocephalic and atraumatic.     Right Ear: External ear normal.     Left Ear: External ear normal.  Eyes:     General: No scleral icterus. Cardiovascular:     Rate and Rhythm: Normal rate and regular rhythm.     Pulses: Normal pulses.     Heart sounds: Normal heart sounds. No murmur heard.   No friction rub. No gallop.  Pulmonary:     Effort: Pulmonary effort is normal.     Breath sounds: Normal breath sounds. No wheezing, rhonchi or rales.  Musculoskeletal:        General: Normal range of motion.  Skin:    General: Skin is warm and dry.  Neurological:     General: No focal deficit present.     Mental Status: He is alert and oriented to person, place, and time.  Psychiatric:        Attention and Perception: Attention normal.        Mood and Affect: Mood normal.        Speech: Speech normal.    BP 114/78    Pulse (!) 59    Resp 16    Wt 218 lb 12.8 oz (99.2 kg)    SpO2 91%    BMI 30.52 kg/m  Wt Readings from Last 3 Encounters:  01/31/22 218 lb 12.8 oz (99.2 kg)  11/29/21 213 lb 6.4 oz (96.8 kg)  10/17/21 220 lb 12.8 oz (100.2 kg)     Health Maintenance Due  Topic Date Due   COLON CANCER SCREENING ANNUAL FOBT  Never done   Zoster Vaccines- Shingrix (1 of 2) Never done   OPHTHALMOLOGY EXAM  05/03/2021    There are no preventive care reminders to display for this patient.  Lab Results  Component Value  Date   TSH 0.992 10/31/2016   Lab Results  Component Value Date   WBC 9.6 11/29/2021   HGB 14.7 11/29/2021   HCT 44.9 11/29/2021   MCV 89 11/29/2021   PLT 201 11/29/2021   Lab Results  Component Value Date   NA 137 11/29/2021   K 4.4 11/29/2021   CO2 24 11/29/2021   GLUCOSE 117 (H) 11/29/2021   BUN 26 11/29/2021   CREATININE 1.34 (H) 11/29/2021   BILITOT 0.3 11/29/2021   ALKPHOS 68 11/29/2021   AST 23 11/29/2021   ALT 19 11/29/2021   PROT 7.3 11/29/2021   ALBUMIN 4.5 11/29/2021   CALCIUM 9.4 11/29/2021   ANIONGAP 8 05/31/2017   EGFR 61 11/29/2021   Lab Results  Component Value Date   CHOL 86 (L) 11/29/2021   Lab Results  Component Value Date   HDL 35 (L) 11/29/2021   Lab Results  Component Value Date   LDLCALC 37 11/29/2021   Lab Results  Component Value Date   TRIG 63 11/29/2021   Lab Results  Component Value Date   CHOLHDL 2.5 11/29/2021   Lab Results  Component Value Date   HGBA1C 7.0 (H) 11/29/2021      Assessment & Plan:   Problem List Items Addressed This Visit       Cardiovascular and Mediastinum   Essential hypertension (Chronic)    Hypertension remains stable at this time on his current medication regimen. Patient will continue with Amlodipine 10 mg, Carvedilol 25 mg twice daily, and Valsartan-HCTZ 320-25 mg. He was encouraged to continue with the lifestyle changes he has made  to manage his hypertension as well.       Relevant Medications   sildenafil (VIAGRA) 50 MG tablet     Endocrine   DM (diabetes mellitus), type 2 with neurological complications ([place])    Patient's foot exam demonstrated some loss of sensation in the great toe of his left foot and the left heel. Otherwise, it was normal. He should continue on Duloxetine (Cymbalta) 60 mg for his neuropathy. An ophthalmology referral was made so that he can have a dilated eye exam.   Patient is having hypoglycemic incidents on his current diabetes medication regimen. He was advised  that he needs to begin taking his blood sugars at least once daily, preferably fasting, to help prevent this from occurring. Additionally, changes were made to his medications. Metformin will be reduced to 1068m once daily, with breakfast. Lantus will be reduced to 30 units, injected at bedtime. Trulicity injection will remain the same.  HbA1C will be collected in the lab today to assess his diabetic management.      Relevant Medications   glucose-Vitamin C 4-0.006 GM per chewable tablet 1 tablet   insulin glargine (LANTUS SOLOSTAR) 100 UNIT/ML Solostar Pen   metFORMIN (GLUCOPHAGE) 1000 MG tablet     Other   Chronic paranoid schizophrenia ([place]    Patient will continue on current medical management for mental health conditions.       Erectile dysfunction    Cialis is no longer effective for this patient. Prescription for Sildenafil (Viagra) was provided. Patient will trial this medication. If this is not effective for his erectile dysfunction, will make a referral to urology for further workup.      Tobacco use    Smoking cessation was discussed again with the patient. He is now only smoking 5-6 cigarettes per day.       Colon cancer screening    Patient was unable to pick up the screening kit after his last visit. Colon cancer FOBT kit has been ordered again, and patient plans to pick it up today and return it with a sample. He affirmed that he has transportation available so should be able to return the kit to the office.      Relevant Orders   Fecal occult blood, imunochemical   Other Visit Diagnoses     Type 2 diabetes mellitus with hyperosmolarity without coma, with long-term current use of insulin (HCC)    -  Primary   Relevant Medications   glucose-Vitamin C 4-0.006 GM per chewable tablet 1 tablet   insulin glargine (LANTUS SOLOSTAR) 100 UNIT/ML Solostar Pen   metFORMIN (GLUCOPHAGE) 1000 MG tablet   Other Relevant Orders   POCT glucose (manual entry) (Completed)    Ambulatory referral to Ophthalmology   Hemoglobin A1c   POCT glucose (manual entry) (Completed)       Meds ordered this encounter  Medications   glucose-Vitamin C 4-0.006 GM per chewable tablet 1 tablet   insulin glargine (LANTUS SOLOSTAR) 100 UNIT/ML Solostar Pen    Sig: Inject 30 Units into the skin at bedtime.    Dispense:  15 mL    Refill:  4    Dose change   metFORMIN (GLUCOPHAGE) 1000 MG tablet    Sig: Take 1 tablet (1,000 mg total) by mouth daily with breakfast. TAKE ONE TABLET BY MOUTH TWICE A DAY DAILY WITH A MEAL    Dispense:  90 tablet    Refill:  2    Dose change   sildenafil (VIAGRA) 50 MG  tablet    Sig: Take 1 tablet (50 mg total) by mouth daily as needed for erectile dysfunction.    Dispense:  10 tablet    Refill:  0    Follow-up: Return in about 3 months (around 04/30/2022).    Asencion Noble, MD

## 2022-01-31 NOTE — Assessment & Plan Note (Signed)
Patient will continue on current medical management for mental health conditions.

## 2022-01-31 NOTE — Assessment & Plan Note (Signed)
Patient was unable to pick up the screening kit after his last visit. Colon cancer FOBT kit has been ordered again, and patient plans to pick it up today and return it with a sample. He affirmed that he has transportation available so should be able to return the kit to the office.

## 2022-01-31 NOTE — Patient Instructions (Addendum)
Your insulin dosing has changed to 30 units at bedtime. Your metformin has been decreased to once per day.  Please try Viagra (sildenafil) instead of the Cialis.  A referral has been made to ophthalmology so that you to get an eye exam.  Labs will be done today to check your A1C.  Please pick up the colon cancer screening kit today and return it with a sample.  Please return to see Dr. Joya Gaskins in 3 months.

## 2022-01-31 NOTE — Assessment & Plan Note (Signed)
Cialis is no longer effective for this patient. Prescription for Sildenafil (Viagra) was provided. Patient will trial this medication. If this is not effective for his erectile dysfunction, will make a referral to urology for further workup.

## 2022-01-31 NOTE — Progress Notes (Signed)
Pt was given a glucose tablet,orange juice, and snack to bring his blood sugar up  Bs: 44

## 2022-01-31 NOTE — Assessment & Plan Note (Signed)
Patient's foot exam demonstrated some loss of sensation in the great toe of his left foot and the left heel. Otherwise, it was normal. He should continue on Duloxetine (Cymbalta) 60 mg for his neuropathy. An ophthalmology referral was made so that he can have a dilated eye exam.   Patient is having hypoglycemic incidents on his current diabetes medication regimen. He was advised that he needs to begin taking his blood sugars at least once daily, preferably fasting, to help prevent this from occurring. Additionally, changes were made to his medications. Metformin will be reduced to 1000mg  once daily, with breakfast. Lantus will be reduced to 30 units, injected at bedtime. Trulicity injection will remain the same.  HbA1C will be collected in the lab today to assess his diabetic management.

## 2022-01-31 NOTE — Assessment & Plan Note (Signed)
Hypertension remains stable at this time on his current medication regimen. Patient will continue with Amlodipine 10 mg, Carvedilol 25 mg twice daily, and Valsartan-HCTZ 320-25 mg. He was encouraged to continue with the lifestyle changes he has made to manage his hypertension as well.

## 2022-02-01 LAB — HEMOGLOBIN A1C
Est. average glucose Bld gHb Est-mCnc: 134 mg/dL
Hgb A1c MFr Bld: 6.3 % — ABNORMAL HIGH (ref 4.8–5.6)

## 2022-02-02 ENCOUNTER — Telehealth: Payer: Self-pay

## 2022-02-02 NOTE — Telephone Encounter (Signed)
-----   Message from Storm Frisk, MD sent at 02/01/2022  6:34 AM EST ----- Let pt know his A1C is Excellent down to 6.3 ! Follow the current medication plan

## 2022-02-02 NOTE — Telephone Encounter (Signed)
Pt was called and vm was left, Information has been sent to nurse pool.   

## 2022-03-14 ENCOUNTER — Other Ambulatory Visit: Payer: Self-pay | Admitting: Critical Care Medicine

## 2022-04-02 ENCOUNTER — Other Ambulatory Visit: Payer: Self-pay | Admitting: Critical Care Medicine

## 2022-04-03 NOTE — Telephone Encounter (Signed)
Requested Prescriptions  ?Pending Prescriptions Disp Refills  ?? BD ULTRA-FINE PEN NEEDLES 29G X 12.7MM MISC [Pharmacy Med Name: BD PEN ND UF 29G  EA] 100 each 1  ?  Sig: USE WITH LANTUS PEN  ?  ? Endocrinology: Diabetes - Testing Supplies Passed - 04/02/2022  1:22 PM  ?  ?  Passed - Valid encounter within last 12 months  ?  Recent Outpatient Visits   ?      ? 2 months ago Type 2 diabetes mellitus with hyperosmolarity without coma, with long-term current use of insulin (HCC)  ? [facility] Storm Frisk, MD  ? 4 months ago Type 2 diabetes mellitus with hyperosmolarity without coma, with long-term current use of insulin (HCC)  ? [facility] Storm Frisk, MD  ? 5 months ago DM (diabetes mellitus), type 2 with neurological complications [facility])  ? [facility] Storm Frisk, MD  ? 11 months ago Controlled type 2 diabetes mellitus without complication, without long-term current use of insulin (HCC)  ? [facility] Storm Frisk, MD  ? 1 year ago DM (diabetes mellitus), type 2 with neurological complications [name])  ? [facility] Storm Frisk, MD  ?  ?  ?Future Appointments   ?        ? In 4 weeks Storm Frisk, MD [facility]  ?  ? ?  ?  ?  ? ?

## 2022-04-23 LAB — HM DIABETES EYE EXAM

## 2022-05-01 ENCOUNTER — Ambulatory Visit: Payer: Medicaid Other | Admitting: Critical Care Medicine

## 2022-05-01 NOTE — Progress Notes (Incomplete)
? ?Established Patient Office Visit ? ?Subjective:  ?Patient ID: [name], male    DOB: 1961/01/04  Age: 61 y.o. MRN: [id number] ? ?CC:  ?No chief complaint on file. ? ? ?HPI ?[name] presents for diabetes follow up. He stated that he was feeling weak, and when the CMA took his blood sugar, it was 44 mg/dL. He was given a glucose tablet, orange juice, and goldfish crackers in an attempt to raise his blood glucose. 45 minutes later, his blood sugar had increased to 162 mg/dL. Prior to coming to the office today, he had eaten two eggs around 4 AM.  ? ?The patient states that he does not take his blood sugars at home. If he feels weakness like he did upon presentation to the office, he will eat a snack and tends to feel better afterwards. He has made concerted efforts to improve his diet, and eat healthier foods including fruits and vegetables. He reports that he takes his diabetes medications as prescribed, including weekly Trulicity, metformin 6767 mg twice daily, and Lantus insulin, 50 units at bedtime. He reports no other diabetic symptoms, except for his continued neuropathy in the hands and feet. ? ?The patient also has a history of hypertension. He is currently taking amlodipine 10 mg daily, carvedilol 32m twice daily, and valsartan-HCTZ 320-25 daily. His blood pressure was excellent upon measurement in office today, at 114/78 mmHg. He is pleased with this result, as he has been making concerted efforts at lifestyle management. ? ?He has a concern that the Cialis he has been taking for erectile dysfunction is not working. He would like to try a different medication. His other concern is regarding his vision because he is not seeing as well as he used to. He has not been to an ophthalmologist in over a year, and would like an appointment to see one. ? ?He continues to smoke, but has decreased his usage to about 6 cigarettes per day, a decrease from about 1 PPD. He says that he tends to smoke  when he is reading his Bible or writing. ? ?The patient is due for colon cancer screening. An FOBT kit was ordered at his last visit, but he ended up being unable to pick it up. He plans on picking the kit up today after his appointment. ? ?Past Medical History:  ?Diagnosis Date  ?? COVID-19 virus infection 01/20/2020  ?? Dental abscess 10/11/2020  ?? Diabetes mellitus   ?? Diabetic neuropathy ([place]   ?? High cholesterol   ?? Hypertension   ?? Schizophrenia ([facility]   ?? Toenail fungus 08/17/2019  ? ? ?Past Surgical History:  ?Procedure Laterality Date  ?? TONSILLECTOMY    ? ? ?Family History  ?Problem Relation Age of Onset  ?? Hypertension Mother   ?? Cancer Father   ?? Alcoholism Other   ? ? ?Social History  ? ?Socioeconomic History  ?? Marital status: Single  ?  Spouse name: Not on file  ?? Number of children: Not on file  ?? Years of education: Not on file  ?? Highest education level: Not on file  ?Occupational History  ?? Not on file  ?Tobacco Use  ?? Smoking status: Every Day  ?  Packs/day: 0.50  ?  Types: Cigarettes  ?? Smokeless tobacco: Never  ?Vaping Use  ?? Vaping Use: Never used  ?Substance and Sexual Activity  ?? Alcohol use: No  ?? Drug use: No  ?? Sexual activity: Yes  ?Other Topics Concern  ?? Not on  file  ?Social History Narrative  ?? Not on file  ? ?Social Determinants of Health  ? ?Financial Resource Strain: Not on file  ?Food Insecurity: Not on file  ?Transportation Needs: Not on file  ?Physical Activity: Not on file  ?Stress: Not on file  ?Social Connections: Not on file  ?Intimate Partner Violence: Not on file  ? ? ?Outpatient Medications Prior to Visit  ?Medication Sig Dispense Refill  ?? albuterol (VENTOLIN HFA) 108 (90 Base) MCG/ACT inhaler INHALE TWO PUFFS BY MOUTH INTO THE LUNGS EVERY 4 HOURS AS NEEDED FOR WHEEZING OR SHORTNESS OF BREATH 18 g 1  ?? amLODipine (NORVASC) 10 MG tablet Take 1 tablet (10 mg total) by mouth daily. 90 tablet 3  ?? aspirin EC 81 MG tablet Take 1 tablet (81 mg total) by  mouth daily. 100 tablet 1  ?? atorvastatin (LIPITOR) 20 MG tablet Take 1 tablet (20 mg total) by mouth daily. Reported on 05/01/2016 90 tablet 3  ?? BD ULTRA-FINE PEN NEEDLES 29G X 12.7MM MISC USE WITH LANTUS PEN 100 each 1  ?? Blood Glucose Monitoring Suppl (TRUE METRIX METER) w/Device KIT Use as instructed to check blood sugar daily. 1 kit 0  ?? carvedilol (COREG) 25 MG tablet Take 1 tablet (25 mg total) by mouth 2 (two) times daily with a meal. 120 tablet 4  ?? Dulaglutide (TRULICITY) 1.5 WU/9.8JX SOPN Inject 1.5 mg into the skin once a week. 2 mL 4  ?? DULoxetine (CYMBALTA) 60 MG capsule Take 1 capsule (60 mg total) by mouth daily. 90 capsule 3  ?? glucose blood (TRUE METRIX BLOOD GLUCOSE TEST) test strip Use as instructed to check blood sugar daily. 100 each 12  ?? insulin glargine (LANTUS SOLOSTAR) 100 UNIT/ML Solostar Pen Inject 30 Units into the skin at bedtime. 15 mL 4  ?? metFORMIN (GLUCOPHAGE) 1000 MG tablet Take 1 tablet (1,000 mg total) by mouth daily with breakfast. TAKE ONE TABLET BY MOUTH TWICE A DAY DAILY WITH A MEAL 90 tablet 2  ?? OLANZapine (ZYPREXA) 15 MG tablet Take 1 tablet (15 mg total) by mouth at bedtime. 30 tablet 4  ?? sildenafil (VIAGRA) 50 MG tablet Take 1 tablet (50 mg total) by mouth daily as needed for erectile dysfunction. 10 tablet 0  ?? TRUEplus Lancets 28G MISC Use as instructed to check blood sugar daily. 100 each 11  ?? valsartan-hydrochlorothiazide (DIOVAN-HCT) 320-25 MG tablet TAKE ONE TABLET BY MOUTH DAILY 90 tablet 0  ? ?Facility-Administered Medications Prior to Visit  ?Medication Dose Route Frequency Provider Last Rate Last Admin  ?? glucose-Vitamin C 4-0.006 GM per chewable tablet 1 tablet  1 tablet Oral Once Elsie Stain, MD      ? ? ?Allergies  ?Allergen Reactions  ?? Benadryl [Diphenhydramine Hcl]   ?? Diphenhydramine   ?  GI upset, paradoxic agitation  ?? Haldol [Haloperidol Lactate]   ?? Haldol [Haloperidol]   ?  "makes me tremble, bite my tongue"  ?? Lisinopril  Swelling  ?? Thorazine [Chlorpromazine]   ?  Tremble, bite my tongue  ?? Thorazine [Chlorpromazine]   ? ? ?ROS ?Review of Systems  ?Constitutional: Negative.  Negative for fatigue.  ?HENT: Negative.  Negative for ear pain.   ?Eyes:  Positive for visual disturbance.  ?Respiratory:  Negative for chest tightness and shortness of breath.   ?Cardiovascular:  Negative for chest pain and leg swelling.  ?Gastrointestinal:  Negative for constipation, diarrhea and nausea.  ?Genitourinary:  Negative for dysuria and frequency.  ?Musculoskeletal: Negative.   ?  Skin: Negative.   ?Neurological:  Positive for numbness. Negative for dizziness and weakness.  ?Psychiatric/Behavioral:  Negative for dysphoric mood.   ? ?  ?Objective:  ?  ?Physical Exam ?Constitutional:   ?   Appearance: Normal appearance. He is normal weight.  ?HENT:  ?   Head: Normocephalic and atraumatic.  ?   Right Ear: External ear normal.  ?   Left Ear: External ear normal.  ?Eyes:  ?   General: No scleral icterus. ?Cardiovascular:  ?   Rate and Rhythm: Normal rate and regular rhythm.  ?   Pulses: Normal pulses.  ?   Heart sounds: Normal heart sounds. No murmur heard. ?  No friction rub. No gallop.  ?Pulmonary:  ?   Effort: Pulmonary effort is normal.  ?   Breath sounds: Normal breath sounds. No wheezing, rhonchi or rales.  ?Musculoskeletal:     ?   General: Normal range of motion.  ?Skin: ?   General: Skin is warm and dry.  ?Neurological:  ?   General: No focal deficit present.  ?   Mental Status: He is alert and oriented to person, place, and time.  ?Psychiatric:     ?   Attention and Perception: Attention normal.     ?   Mood and Affect: Mood normal.     ?   Speech: Speech normal.  ? ? ?There were no vitals taken for this visit. ?Wt Readings from Last 3 Encounters:  ?01/31/22 218 lb 12.8 oz (99.2 kg)  ?11/29/21 213 lb 6.4 oz (96.8 kg)  ?10/17/21 220 lb 12.8 oz (100.2 kg)  ? ? ? ?Health Maintenance Due  ?Topic Date Due  ?? COLON CANCER SCREENING ANNUAL FOBT  Never  done  ?? Zoster Vaccines- Shingrix (1 of 2) Never done  ? ? ?There are no preventive care reminders to display for this patient. ? ?Lab Results  ?Component Value Date  ? TSH 0.992 10/31/2016  ? ?Lab R

## 2022-05-02 ENCOUNTER — Other Ambulatory Visit: Payer: Self-pay | Admitting: Critical Care Medicine

## 2022-05-15 ENCOUNTER — Other Ambulatory Visit: Payer: Self-pay | Admitting: Critical Care Medicine

## 2022-05-16 NOTE — Telephone Encounter (Signed)
Requested Prescriptions  Pending Prescriptions Disp Refills  . ASPIRIN LOW DOSE 81 MG tablet [Pharmacy Med Name: ASPIRIN 81 MG TAB 81MG TAB] 100 tablet 0    Sig: TAKE ONE TABLET BY MOUTH DAILY     Analgesics:  NSAIDS - aspirin Failed - 05/15/2022  2:57 PM      Failed - Cr in normal range and within 360 days    Creatinine  Date Value Ref Range Status  12/04/2014 1.00 0.60 - 1.30 mg/dL Final   Creatinine, Ser  Date Value Ref Range Status  11/29/2021 1.34 (H) 0.76 - 1.27 mg/dL Final         Passed - eGFR is 10 or above and within 360 days    EGFR (African American)  Date Value Ref Range Status  12/04/2014 >60 >18m/min Final   GFR calc Af Amer  Date Value Ref Range Status  01/17/2021 70 >59 mL/min/1.73 Final    Comment:    **In accordance with recommendations from the NKF-ASN Task force,**   [facility] is in the process of updating its eGFR calculation to the   2021 CKD-EPI creatinine equation that estimates kidney function   without a race variable.    EGFR (Non-African Amer.)  Date Value Ref Range Status  12/04/2014 >60 >663mmin Final    Comment:    eGFR values <6043min/1.73 m2 may be an indication of chronic kidney disease (CKD). Calculated eGFR, using the MRDR Study equation, is useful in  patients with stable renal function. The eGFR calculation will not be reliable in acutely ill patients when serum creatinine is changing rapidly. It is not useful in patients on dialysis. The eGFR calculation may not be applicable to patients at the low and high extremes of body sizes, pregnant women, and vegetarians.    GFR calc non Af Amer  Date Value Ref Range Status  01/17/2021 61 >59 mL/min/1.73 Final   eGFR  Date Value Ref Range Status  11/29/2021 61 >59 mL/min/1.73 Final         Passed - Patient is not pregnant      Passed - Valid encounter within last 12 months    Recent Outpatient Visits          3 months ago Type 2 diabetes mellitus with hyperosmolarity without  coma, with long-term current use of insulin [place]   5 months ago Type 2 diabetes mellitus with hyperosmolarity without coma, with long-term current use of insulin [facility]   7 months ago DM (diabetes mellitus), type 2 with neurological complications ([facility] [place]   1 year ago Controlled type 2 diabetes mellitus without complication, without long-term current use of insulin ([facility]   1 year ago DM [facility]diabetes mellitus), type 2 with neurological complications [facility] [place]      Future Appointments            In 1 month [facility] [place]

## 2022-06-27 ENCOUNTER — Other Ambulatory Visit: Payer: Self-pay | Admitting: Critical Care Medicine

## 2022-07-03 ENCOUNTER — Encounter: Payer: Self-pay | Admitting: Critical Care Medicine

## 2022-07-03 ENCOUNTER — Ambulatory Visit: Payer: Medicaid Other | Attending: Critical Care Medicine | Admitting: Critical Care Medicine

## 2022-07-03 VITALS — BP 139/84 | HR 68 | Wt 213.6 lb

## 2022-07-03 DIAGNOSIS — E1100 Type 2 diabetes mellitus with hyperosmolarity without nonketotic hyperglycemic-hyperosmolar coma (NKHHC): Secondary | ICD-10-CM

## 2022-07-03 DIAGNOSIS — I10 Essential (primary) hypertension: Secondary | ICD-10-CM | POA: Diagnosis not present

## 2022-07-03 DIAGNOSIS — E1149 Type 2 diabetes mellitus with other diabetic neurological complication: Secondary | ICD-10-CM

## 2022-07-03 DIAGNOSIS — Z794 Long term (current) use of insulin: Secondary | ICD-10-CM | POA: Insufficient documentation

## 2022-07-03 DIAGNOSIS — Z1211 Encounter for screening for malignant neoplasm of colon: Secondary | ICD-10-CM

## 2022-07-03 DIAGNOSIS — B351 Tinea unguium: Secondary | ICD-10-CM | POA: Diagnosis not present

## 2022-07-03 DIAGNOSIS — Z7984 Long term (current) use of oral hypoglycemic drugs: Secondary | ICD-10-CM | POA: Diagnosis not present

## 2022-07-03 DIAGNOSIS — N5201 Erectile dysfunction due to arterial insufficiency: Secondary | ICD-10-CM

## 2022-07-03 DIAGNOSIS — Z7985 Long-term (current) use of injectable non-insulin antidiabetic drugs: Secondary | ICD-10-CM | POA: Insufficient documentation

## 2022-07-03 DIAGNOSIS — F17210 Nicotine dependence, cigarettes, uncomplicated: Secondary | ICD-10-CM | POA: Insufficient documentation

## 2022-07-03 DIAGNOSIS — N529 Male erectile dysfunction, unspecified: Secondary | ICD-10-CM | POA: Diagnosis not present

## 2022-07-03 DIAGNOSIS — E119 Type 2 diabetes mellitus without complications: Secondary | ICD-10-CM | POA: Diagnosis not present

## 2022-07-03 DIAGNOSIS — Z8616 Personal history of COVID-19: Secondary | ICD-10-CM | POA: Diagnosis not present

## 2022-07-03 DIAGNOSIS — Z79899 Other long term (current) drug therapy: Secondary | ICD-10-CM | POA: Diagnosis not present

## 2022-07-03 DIAGNOSIS — Z720 Tobacco use: Secondary | ICD-10-CM

## 2022-07-03 LAB — POCT GLYCOSYLATED HEMOGLOBIN (HGB A1C): HbA1c, POC (controlled diabetic range): 7.2 % — AB (ref 0.0–7.0)

## 2022-07-03 LAB — GLUCOSE, POCT (MANUAL RESULT ENTRY): POC Glucose: 94 mg/dl (ref 70–99)

## 2022-07-03 MED ORDER — TRUE METRIX BLOOD GLUCOSE TEST VI STRP: ORAL_STRIP | 12 refills | Status: DC

## 2022-07-03 MED ORDER — ATORVASTATIN CALCIUM 20 MG PO TABS: 20.0000 mg | ORAL_TABLET | Freq: Every day | ORAL | 3 refills | Status: DC

## 2022-07-03 MED ORDER — SILDENAFIL CITRATE 50 MG PO TABS: 100.0000 mg | ORAL_TABLET | Freq: Every day | ORAL | 0 refills | Status: DC | PRN

## 2022-07-03 MED ORDER — CARVEDILOL 25 MG PO TABS: 25.0000 mg | ORAL_TABLET | Freq: Two times a day (BID) | ORAL | 4 refills | Status: DC

## 2022-07-03 MED ORDER — TRULICITY 1.5 MG/0.5ML ~~LOC~~ SOAJ: 1.5000 mg | SUBCUTANEOUS | 0 refills | Status: DC

## 2022-07-03 MED ORDER — LANTUS SOLOSTAR 100 UNIT/ML ~~LOC~~ SOPN
30.0000 [IU] | PEN_INJECTOR | Freq: Every day | SUBCUTANEOUS | 4 refills | Status: DC
Start: 2022-07-03 — End: 2022-10-01

## 2022-07-03 MED ORDER — BD PEN NEEDLE ORIGINAL U/F 29G X 12.7MM MISC: 1 refills | Status: DC

## 2022-07-03 MED ORDER — DULOXETINE HCL 60 MG PO CPEP: 60.0000 mg | ORAL_CAPSULE | Freq: Every day | ORAL | 3 refills | Status: DC

## 2022-07-03 MED ORDER — METFORMIN HCL 1000 MG PO TABS: 1000.0000 mg | ORAL_TABLET | Freq: Every day | ORAL | 2 refills | Status: DC

## 2022-07-03 MED ORDER — VALSARTAN 320 MG PO TABS: 320.0000 mg | ORAL_TABLET | Freq: Every day | ORAL | 2 refills | Status: DC

## 2022-07-03 MED ORDER — AMLODIPINE BESYLATE 10 MG PO TABS: 10.0000 mg | ORAL_TABLET | Freq: Every day | ORAL | 3 refills | Status: DC

## 2022-07-03 MED ORDER — OLANZAPINE 15 MG PO TABS: 15.0000 mg | ORAL_TABLET | Freq: Every day | ORAL | 4 refills | Status: DC

## 2022-07-03 MED ORDER — TRUEPLUS LANCETS 28G MISC: 11 refills | Status: DC

## 2022-07-03 NOTE — Progress Notes (Signed)
Established Patient Office Visit  Subjective:  Patient ID: [name], male    DOB: [birth year]  Age: 61 y.o. MRN: [id number]  CC:  Chief Complaint  Patient presents with   Diabetes    HPI 01/2022 [name] presents for diabetes follow up. He stated that he was feeling weak, and when the CMA took his blood sugar, it was 44 mg/dL. He was given a glucose tablet, orange juice, and goldfish crackers in an attempt to raise his blood glucose. 45 minutes later, his blood sugar had increased to 162 mg/dL. Prior to coming to the office today, he had eaten two eggs around 4 AM.   The patient states that he does not take his blood sugars at home. If he feels weakness like he did upon presentation to the office, he will eat a snack and tends to feel better afterwards. He has made concerted efforts to improve his diet, and eat healthier foods including fruits and vegetables. He reports that he takes his diabetes medications as prescribed, including weekly Trulicity, metformin 2122 mg twice daily, and Lantus insulin, 50 units at bedtime. He reports no other diabetic symptoms, except for his continued neuropathy in the hands and feet.  The patient also has a history of hypertension. He is currently taking amlodipine 10 mg daily, carvedilol 73m twice daily, and valsartan-HCTZ 320-25 daily. His blood pressure was excellent upon measurement in office today, at 114/78 mmHg. He is pleased with this result, as he has been making concerted efforts at lifestyle management.  He has a concern that the Cialis he has been taking for erectile dysfunction is not working. He would like to try a different medication. His other concern is regarding his vision because he is not seeing as well as he used to. He has not been to an ophthalmologist in over a year, and would like an appointment to see one.  He continues to smoke, but has decreased his usage to about 6 cigarettes per day, a decrease from about 1 PPD.  He says that he tends to smoke when he is reading his Bible or writing.  The patient is due for colon cancer screening. An FOBT kit was ordered at his last visit, but he ended up being unable to pick it up. He plans on picking the kit up today after his appointment.  7//11 Patient is seen in return visit and doing well overall arrival blood pressure 139/84 blood sugar 84 A1c though is up to 7.3.  Patient is still smoking about 6 cigarettes daily.  He is trying to be healthier diet.  He has not been able to reduce his tobacco intake however.  Patient states he has not been successful with his erectile dysfunction on the lower dose of Viagra would like a higher dose. Past Medical History:  Diagnosis Date   COVID-19 virus infection 01/20/2020   Dental abscess 10/11/2020   Diabetes mellitus    Diabetic neuropathy (HCC)    High cholesterol    Hypertension    [place]    Toenail fungus 08/17/2019    Past Surgical History:  Procedure Laterality Date   TONSILLECTOMY      Family History  Problem Relation Age of Onset   Hypertension Mother    Cancer Father    Alcoholism Other     Social History   Socioeconomic History   Marital status: Single    Spouse name: Not on file   Number of children: Not on file   Years  of education: Not on file   Highest education level: Not on file  Occupational History   Not on file  Tobacco Use   Smoking status: Every Day    Packs/day: 0.50    Types: Cigarettes   Smokeless tobacco: Never  Vaping Use   Vaping Use: Never used  Substance and Sexual Activity   Alcohol use: No   Drug use: No   Sexual activity: Yes  Other Topics Concern   Not on file  Social History Narrative   Not on file   Social Determinants of Health   Financial Resource Strain: Not on file  Food Insecurity: Not on file  Transportation Needs: Not on file  Physical Activity: Not on file  Stress: Not on file  Social Connections: Not on file  Intimate Partner  Violence: Not on file    Outpatient Medications Prior to Visit  Medication Sig Dispense Refill   albuterol (VENTOLIN HFA) 108 (90 Base) MCG/ACT inhaler INHALE TWO PUFFS BY MOUTH INTO THE LUNGS EVERY 4 HOURS AS NEEDED FOR WHEEZING OR SHORTNESS OF BREATH 18 g 2   ASPIRIN LOW DOSE 81 MG tablet TAKE ONE TABLET BY MOUTH DAILY 100 tablet 0   Blood Glucose Monitoring Suppl (TRUE METRIX METER) w/Device KIT Use as instructed to check blood sugar daily. 1 kit 0   amLODipine (NORVASC) 10 MG tablet Take 1 tablet (10 mg total) by mouth daily. 90 tablet 3   atorvastatin (LIPITOR) 20 MG tablet Take 1 tablet (20 mg total) by mouth daily. Reported on 05/01/2016 90 tablet 3   BD ULTRA-FINE PEN NEEDLES 29G X 12.7MM MISC USE WITH LANTUS PEN 100 each 1   carvedilol (COREG) 25 MG tablet Take 1 tablet (25 mg total) by mouth 2 (two) times daily with a meal. 120 tablet 4   Dulaglutide (TRULICITY) 1.5 JK/0.9FG SOPN INJECT 1.5 MG INTO THE SKIN ONCE A WEEK 2 mL 0   DULoxetine (CYMBALTA) 60 MG capsule Take 1 capsule (60 mg total) by mouth daily. 90 capsule 3   glucose blood (TRUE METRIX BLOOD GLUCOSE TEST) test strip Use as instructed to check blood sugar daily. 100 each 12   insulin glargine (LANTUS SOLOSTAR) 100 UNIT/ML Solostar Pen Inject 30 Units into the skin at bedtime. 15 mL 4   metFORMIN (GLUCOPHAGE) 1000 MG tablet Take 1 tablet (1,000 mg total) by mouth daily with breakfast. TAKE ONE TABLET BY MOUTH TWICE A DAY DAILY WITH A MEAL 90 tablet 2   OLANZapine (ZYPREXA) 15 MG tablet Take 1 tablet (15 mg total) by mouth at bedtime. 30 tablet 4   sildenafil (VIAGRA) 50 MG tablet Take 1 tablet (50 mg total) by mouth daily as needed for erectile dysfunction. 10 tablet 0   TRUEplus Lancets 28G MISC Use as instructed to check blood sugar daily. 100 each 11   valsartan (DIOVAN) 320 MG tablet Take 320 mg by mouth daily.     valsartan-hydrochlorothiazide (DIOVAN-HCT) 320-25 MG tablet TAKE ONE TABLET BY MOUTH DAILY 90 tablet 0    Facility-Administered Medications Prior to Visit  Medication Dose Route Frequency Provider Last Rate Last Admin   glucose-Vitamin C 4-0.006 GM per chewable tablet 1 tablet  1 tablet Oral Once Elsie Stain, MD        Allergies  Allergen Reactions   Benadryl [Diphenhydramine Hcl]    Diphenhydramine     GI upset, paradoxic agitation   Haldol [Haloperidol Lactate]    Haldol [Haloperidol]     "makes me tremble, bite  my tongue"   Lisinopril Swelling   Thorazine [Chlorpromazine]     Tremble, bite my tongue   Thorazine [Chlorpromazine]     ROS Review of Systems  Constitutional: Negative.  Negative for fatigue.  HENT: Negative.  Negative for ear pain.   Eyes:  Positive for visual disturbance.  Respiratory:  Negative for chest tightness and shortness of breath.   Cardiovascular:  Negative for chest pain and leg swelling.  Gastrointestinal:  Negative for constipation, diarrhea and nausea.  Genitourinary:  Negative for dysuria and frequency.       Erectile dysfunction  Musculoskeletal: Negative.   Skin: Negative.   Neurological:  Positive for numbness. Negative for dizziness and weakness.  Psychiatric/Behavioral:  Negative for dysphoric mood.       Objective:    Physical Exam Vitals reviewed.  Constitutional:      Appearance: Normal appearance. He is well-developed and normal weight. He is not diaphoretic.  HENT:     Head: Normocephalic and atraumatic.     Right Ear: External ear normal.     Left Ear: External ear normal.     Nose: No nasal deformity, septal deviation, mucosal edema or rhinorrhea.     Right Sinus: No maxillary sinus tenderness or frontal sinus tenderness.     Left Sinus: No maxillary sinus tenderness or frontal sinus tenderness.     Mouth/Throat:     Pharynx: No oropharyngeal exudate.  Eyes:     General: No scleral icterus.    Conjunctiva/sclera: Conjunctivae normal.     Pupils: Pupils are equal, round, and reactive to light.  Neck:     Thyroid:  No thyromegaly.     Vascular: No carotid bruit or JVD.     Trachea: Trachea normal. No tracheal tenderness or tracheal deviation.  Cardiovascular:     Rate and Rhythm: Normal rate and regular rhythm.     Chest Wall: PMI is not displaced.     Pulses: Normal pulses. No decreased pulses.     Heart sounds: Normal heart sounds, S1 normal and S2 normal. Heart sounds not distant. No murmur heard.    No systolic murmur is present.     No diastolic murmur is present.     No friction rub. No gallop. No S3 or S4 sounds.  Pulmonary:     Effort: Pulmonary effort is normal. No tachypnea, accessory muscle usage or respiratory distress.     Breath sounds: Normal breath sounds. No stridor. No decreased breath sounds, wheezing, rhonchi or rales.  Chest:     Chest wall: No tenderness.  Abdominal:     General: Bowel sounds are normal. There is no distension.     Palpations: Abdomen is soft. Abdomen is not rigid.     Tenderness: There is no abdominal tenderness. There is no guarding or rebound.  Musculoskeletal:        General: Normal range of motion.     Cervical back: Normal range of motion and neck supple. No edema, erythema or rigidity. No muscular tenderness. Normal range of motion.  Lymphadenopathy:     Head:     Right side of head: No submental or submandibular adenopathy.     Left side of head: No submental or submandibular adenopathy.     Cervical: No cervical adenopathy.  Skin:    General: Skin is warm and dry.     Coloration: Skin is not pale.     Findings: No rash.     Nails: There is no clubbing.  Neurological:  General: No focal deficit present.     Mental Status: He is alert and oriented to person, place, and time.     Sensory: No sensory deficit.  Psychiatric:        Attention and Perception: Attention normal.        Mood and Affect: Mood normal.        Speech: Speech normal.        Behavior: Behavior normal.     BP 139/84   Pulse 68   Wt 213 lb 9.6 oz (96.9 kg)   SpO2  95%   BMI 29.79 kg/m  Wt Readings from Last 3 Encounters:  07/03/22 213 lb 9.6 oz (96.9 kg)  01/31/22 218 lb 12.8 oz (99.2 kg)  11/29/21 213 lb 6.4 oz (96.8 kg)     Health Maintenance Due  Topic Date Due   COLON CANCER SCREENING ANNUAL FOBT  Never done   Zoster Vaccines- Shingrix (1 of 2) Never done    There are no preventive care reminders to display for this patient.  Lab Results  Component Value Date   TSH 0.992 10/31/2016   Lab Results  Component Value Date   WBC 9.6 11/29/2021   HGB 14.7 11/29/2021   HCT 44.9 11/29/2021   MCV 89 11/29/2021   PLT 201 11/29/2021   Lab Results  Component Value Date   NA 137 11/29/2021   K 4.4 11/29/2021   CO2 24 11/29/2021   GLUCOSE 117 (H) 11/29/2021   BUN 26 11/29/2021   CREATININE 1.34 (H) 11/29/2021   BILITOT 0.3 11/29/2021   ALKPHOS 68 11/29/2021   AST 23 11/29/2021   ALT 19 11/29/2021   PROT 7.3 11/29/2021   ALBUMIN 4.5 11/29/2021   CALCIUM 9.4 11/29/2021   ANIONGAP 8 05/31/2017   EGFR 61 11/29/2021   Lab Results  Component Value Date   CHOL 86 (L) 11/29/2021   Lab Results  Component Value Date   HDL 35 (L) 11/29/2021   Lab Results  Component Value Date   LDLCALC 37 11/29/2021   Lab Results  Component Value Date   TRIG 63 11/29/2021   Lab Results  Component Value Date   CHOLHDL 2.5 11/29/2021   Lab Results  Component Value Date   HGBA1C 7.2 (A) 07/03/2022      Assessment & Plan:   Problem List Items Addressed This Visit       Cardiovascular and Mediastinum   Essential hypertension (Chronic)    Blood pressure currently at goal patient to continue current medications refills given      Relevant Medications   amLODipine (NORVASC) 10 MG tablet   atorvastatin (LIPITOR) 20 MG tablet   carvedilol (COREG) 25 MG tablet   sildenafil (VIAGRA) 50 MG tablet   valsartan (DIOVAN) 320 MG tablet     Endocrine   DM (diabetes mellitus), type 2 with neurological complications (HCC)    Continue  duloxetine for neuropathy      Relevant Medications   atorvastatin (LIPITOR) 20 MG tablet   Dulaglutide (TRULICITY) 1.5 AT/5.5DD SOPN   insulin glargine (LANTUS SOLOSTAR) 100 UNIT/ML Solostar Pen   metFORMIN (GLUCOPHAGE) 1000 MG tablet   valsartan (DIOVAN) 320 MG tablet   Controlled type 2 diabetes mellitus (HCC)    Type 2 diabetes not at goal has crept up with his A1c we will continue current medications and given him dietary recommendations      Relevant Medications   atorvastatin (LIPITOR) 20 MG tablet   Dulaglutide (TRULICITY) 1.5 UK/0.2RK  SOPN   insulin glargine (LANTUS SOLOSTAR) 100 UNIT/ML Solostar Pen   metFORMIN (GLUCOPHAGE) 1000 MG tablet   valsartan (DIOVAN) 320 MG tablet     Other   Erectile dysfunction    Plan to increase dose of Viagra      Tobacco use       Current smoking consumption amount: 5 cigarettes a day  Dicsussion on advise to quit smoking and smoking impacts: Lung impacts cardiovascular impacts  Patient's willingness to quit: Wanting to reduce smoking  Methods to quit smoking discussed:   Behavioral modification Medication management of smoking session drugs discussed: None indicated  Resources provided:  AVS   Setting quit date not established  Follow-up arranged 4 months   Time spent counseling the patient: 5 minutes       Colon cancer screening    Has yet to achieve colon cancer screening we will bring his kit in at the next visit      Other Visit Diagnoses     Type 2 diabetes mellitus with hyperosmolarity without coma, with long-term current use of insulin (HCC)    -  Primary   Relevant Medications   atorvastatin (LIPITOR) 20 MG tablet   Dulaglutide (TRULICITY) 1.5 GY/5.6LS SOPN   glucose blood (TRUE METRIX BLOOD GLUCOSE TEST) test strip   insulin glargine (LANTUS SOLOSTAR) 100 UNIT/ML Solostar Pen   metFORMIN (GLUCOPHAGE) 1000 MG tablet   TRUEplus Lancets 28G MISC   valsartan (DIOVAN) 320 MG tablet   Other Relevant Orders    POCT glucose (manual entry) (Completed)   POCT glycosylated hemoglobin (Hb A1C) (Completed)   Ambulatory referral to Podiatry   Urine microalbumin-creatinine with uACR   Onychomycosis       Relevant Orders   Ambulatory referral to Podiatry      Meds ordered this encounter  Medications   amLODipine (NORVASC) 10 MG tablet    Sig: Take 1 tablet (10 mg total) by mouth daily.    Dispense:  90 tablet    Refill:  3   atorvastatin (LIPITOR) 20 MG tablet    Sig: Take 1 tablet (20 mg total) by mouth daily. Reported on 05/01/2016    Dispense:  90 tablet    Refill:  3   BD ULTRA-FINE PEN NEEDLES 29G X 12.7MM MISC    Sig: USE WITH LANTUS PEN    Dispense:  100 each    Refill:  1   carvedilol (COREG) 25 MG tablet    Sig: Take 1 tablet (25 mg total) by mouth 2 (two) times daily with a meal.    Dispense:  120 tablet    Refill:  4   Dulaglutide (TRULICITY) 1.5 LH/7.3SK SOPN    Sig: Inject 1.5 mg into the skin once a week.    Dispense:  2 mL    Refill:  0    Must keep upcoming appt for additional refills.   DULoxetine (CYMBALTA) 60 MG capsule    Sig: Take 1 capsule (60 mg total) by mouth daily.    Dispense:  90 capsule    Refill:  3   glucose blood (TRUE METRIX BLOOD GLUCOSE TEST) test strip    Sig: Use as instructed to check blood sugar daily.    Dispense:  100 each    Refill:  12   insulin glargine (LANTUS SOLOSTAR) 100 UNIT/ML Solostar Pen    Sig: Inject 30 Units into the skin at bedtime.    Dispense:  15 mL    Refill:  4  Dose change   metFORMIN (GLUCOPHAGE) 1000 MG tablet    Sig: Take 1 tablet (1,000 mg total) by mouth daily with breakfast. TAKE ONE TABLET BY MOUTH TWICE A DAY DAILY WITH A MEAL    Dispense:  90 tablet    Refill:  2    Dose change   OLANZapine (ZYPREXA) 15 MG tablet    Sig: Take 1 tablet (15 mg total) by mouth at bedtime.    Dispense:  30 tablet    Refill:  4   sildenafil (VIAGRA) 50 MG tablet    Sig: Take 2 tablets (100 mg total) by mouth daily as needed  for erectile dysfunction.    Dispense:  10 tablet    Refill:  0   TRUEplus Lancets 28G MISC    Sig: Use as instructed to check blood sugar daily.    Dispense:  100 each    Refill:  11   valsartan (DIOVAN) 320 MG tablet    Sig: Take 1 tablet (320 mg total) by mouth daily.    Dispense:  90 tablet    Refill:  2    Follow-up: Return in about 4 months (around 11/03/2022).    Asencion Noble, MD

## 2022-07-03 NOTE — Assessment & Plan Note (Signed)
Has yet to achieve colon cancer screening we will bring his kit in at the next visit

## 2022-07-03 NOTE — Assessment & Plan Note (Signed)
Plan to increase dose of Viagra

## 2022-07-03 NOTE — Assessment & Plan Note (Signed)
Blood pressure currently at goal patient to continue current medications refills given

## 2022-07-03 NOTE — Patient Instructions (Addendum)
Working on smoking cessation as we discussed  No change in medications refill sent to your [place] [facility]  Podiatry foot doctor referral was made  Only lab needed is a urine study for albumin which is a protein to see if kidneys are being damaged by diabetes  Please bring your colon cancer screening kit in at the next visit  Return to see Dr. Joya Gaskins 4 months

## 2022-07-03 NOTE — Assessment & Plan Note (Signed)
Continue duloxetine for neuropathy

## 2022-07-03 NOTE — Assessment & Plan Note (Signed)
Type 2 diabetes not at goal has crept up with his A1c we will continue current medications and given him dietary recommendations

## 2022-07-03 NOTE — Assessment & Plan Note (Signed)
  .   Current smoking consumption amount: 5 cigarettes a day  Dicsussion on advise to quit smoking and smoking impacts: Lung impacts cardiovascular impacts  Patient's willingness to quit: Wanting to reduce smoking  Methods to quit smoking discussed:   Behavioral modification Medication management of smoking session drugs discussed: None indicated  Resources provided:  AVS   Setting quit date not established  Follow-up arranged 4 months   Time spent counseling the patient: 5 minutes  

## 2022-07-04 LAB — MICROALBUMIN / CREATININE URINE RATIO
Creatinine, Urine: 163.3 mg/dL
Microalb/Creat Ratio: 15 mg/g creat (ref 0–29)
Microalbumin, Urine: 24.5 ug/mL

## 2022-07-05 ENCOUNTER — Telehealth: Payer: Self-pay

## 2022-07-05 NOTE — Progress Notes (Signed)
Let pt know no protein in urine much better than one year ago,  keep up good work on diabetes

## 2022-07-05 NOTE — Telephone Encounter (Signed)
-----   Message from Storm Frisk, MD sent at 07/05/2022  5:20 AM EDT ----- Let pt know no protein in urine much better than one year ago,  keep up good work on diabetes

## 2022-07-05 NOTE — Telephone Encounter (Signed)
Pt was called and vm was left, Information has been sent to nurse pool.   

## 2022-07-23 ENCOUNTER — Other Ambulatory Visit: Payer: Self-pay | Admitting: Critical Care Medicine

## 2022-08-21 ENCOUNTER — Other Ambulatory Visit: Payer: Self-pay | Admitting: Critical Care Medicine

## 2022-08-21 NOTE — Telephone Encounter (Signed)
Requested Prescriptions  Pending Prescriptions Disp Refills  . TRULICITY 1.5 MG/0.5ML SOPN [Pharmacy Med Name: TRULICITY 1.5MG /0.5ML INJ] 2 mL 0    Sig: INJECT 1.5 MG INTO THE SKIN ONCE A WEEK     Endocrinology:  Diabetes - GLP-1 Receptor Agonists Passed - 08/21/2022 12:52 PM      Passed - HBA1C is between 0 and 7.9 and within 180 days    HbA1c, POC (controlled diabetic range)  Date Value Ref Range Status  07/03/2022 7.2 (A) 0.0 - 7.0 % Final         Passed - Valid encounter within last 6 months    Recent Outpatient Visits          1 month ago Type 2 diabetes mellitus with hyperosmolarity without coma, with long-term current use of insulin (HCC)   [facility] Storm Frisk, MD   6 months ago Type 2 diabetes mellitus with hyperosmolarity without coma, with long-term current use of insulin [facility])   [place] [facility] Storm Frisk, MD   8 months ago Type 2 diabetes mellitus with hyperosmolarity without coma, with long-term current use of insulin [facility])   [place] [facility] Storm Frisk, MD   10 months ago DM (diabetes mellitus), type 2 with neurological complications [facility])   [facility] Storm Frisk, MD   1 year ago Controlled type 2 diabetes mellitus without complication, without long-term current use of insulin [facility])   [facility] Storm Frisk, MD

## 2022-08-29 ENCOUNTER — Encounter: Payer: Self-pay | Admitting: Podiatry

## 2022-08-29 ENCOUNTER — Ambulatory Visit (INDEPENDENT_AMBULATORY_CARE_PROVIDER_SITE_OTHER): Payer: Medicaid Other | Admitting: Podiatry

## 2022-08-29 DIAGNOSIS — B351 Tinea unguium: Secondary | ICD-10-CM

## 2022-08-29 DIAGNOSIS — M79675 Pain in left toe(s): Secondary | ICD-10-CM | POA: Diagnosis not present

## 2022-08-29 DIAGNOSIS — L600 Ingrowing nail: Secondary | ICD-10-CM

## 2022-08-29 DIAGNOSIS — E1140 Type 2 diabetes mellitus with diabetic neuropathy, unspecified: Secondary | ICD-10-CM

## 2022-08-29 DIAGNOSIS — M79674 Pain in right toe(s): Secondary | ICD-10-CM | POA: Diagnosis not present

## 2022-08-30 NOTE — Progress Notes (Signed)
Subjective:   Patient ID: [name], male   DOB: 61 y.o.   MRN: [id number]   HPI Patient states he has several issues with 1 being elongated thickened toenails 1-5 both feet another being loose big toenails bilateral which are making walking difficult.  States that this has been ongoing and he has been problems with fungus in the right between his toes.  Patient smokes half pack per day is not active   Review of Systems  All other systems reviewed and are negative.       Objective:  Physical Exam Vitals and nursing note reviewed.  Constitutional:      Appearance: He is well-developed.  Pulmonary:     Effort: Pulmonary effort is normal.  Musculoskeletal:        General: Normal range of motion.  Skin:    General: Skin is warm.  Neurological:     Mental Status: He is alert.     Vascular status found to be intact with muscle strength found to be mildly reduced and neurological diminished both sharp dull vibratory bilateral.  Patient has loose big toenails bilateral that are partially detached and has thick nails 1-5 both feet that are painful with patient at high risk with his diabetes is not in good control.  Good digital perfusion well-oriented     Assessment:  Long-term diabetic with chronic nail disease with thickness and looseness of the hallux nails bilateral and thickness of nailbeds 2 through 5 both feet     Plan:  8 NP reviewed condition recommended current treatment for condition and did recommend debridement and discussed with care giver and him the consideration for nail removal educating him on permanent procedures which could be done and that will have to be considered but today we just went ahead and did debridement all nailbeds 1-5 both feet that are painful no iatrogenic bleeding reappoint routine care

## 2022-10-01 ENCOUNTER — Other Ambulatory Visit: Payer: Self-pay | Admitting: Critical Care Medicine

## 2022-10-15 ENCOUNTER — Other Ambulatory Visit: Payer: Self-pay | Admitting: Critical Care Medicine

## 2022-10-18 ENCOUNTER — Other Ambulatory Visit: Payer: Self-pay | Admitting: Critical Care Medicine

## 2022-11-20 ENCOUNTER — Other Ambulatory Visit: Payer: Self-pay | Admitting: Critical Care Medicine

## 2022-12-02 NOTE — Progress Notes (Unsigned)
Established Patient Office Visit  Subjective:  Patient ID: [name], male    DOB: 05/26/1961  Age: 61 y.o. MRN: [id number]  CC:  No chief complaint on file.   HPI 01/2022 [name] presents for diabetes follow up. He stated that he was feeling weak, and when the CMA took his blood sugar, it was 44 mg/dL. He was given a glucose tablet, orange juice, and goldfish crackers in an attempt to raise his blood glucose. 45 minutes later, his blood sugar had increased to 162 mg/dL. Prior to coming to the office today, he had eaten two eggs around 4 AM.   The patient states that he does not take his blood sugars at home. If he feels weakness like he did upon presentation to the office, he will eat a snack and tends to feel better afterwards. He has made concerted efforts to improve his diet, and eat healthier foods including fruits and vegetables. He reports that he takes his diabetes medications as prescribed, including weekly Trulicity, metformin 1740 mg twice daily, and Lantus insulin, 50 units at bedtime. He reports no other diabetic symptoms, except for his continued neuropathy in the hands and feet.  The patient also has a history of hypertension. He is currently taking amlodipine 10 mg daily, carvedilol 11m twice daily, and valsartan-HCTZ 320-25 daily. His blood pressure was excellent upon measurement in office today, at 114/78 mmHg. He is pleased with this result, as he has been making concerted efforts at lifestyle management.  He has a concern that the Cialis he has been taking for erectile dysfunction is not working. He would like to try a different medication. His other concern is regarding his vision because he is not seeing as well as he used to. He has not been to an ophthalmologist in over a year, and would like an appointment to see one.  He continues to smoke, but has decreased his usage to about 6 cigarettes per day, a decrease from about 1 PPD. He says that he tends to  smoke when he is reading his Bible or writing.  The patient is due for colon cancer screening. An FOBT kit was ordered at his last visit, but he ended up being unable to pick it up. He plans on picking the kit up today after his appointment.  7//11 Patient is seen in return visit and doing well overall arrival blood pressure 139/84 blood sugar 84 A1c though is up to 7.3.  Patient is still smoking about 6 cigarettes daily.  He is trying to be healthier diet.  He has not been able to reduce his tobacco intake however.  Patient states he has not been successful with his erectile dysfunction on the lower dose of Viagra would like a higher dose. Past Medical History:  Diagnosis Date   COVID-19 virus infection 01/20/2020   Dental abscess 10/11/2020   Diabetes mellitus    Diabetic neuropathy (HCC)    High cholesterol    Hypertension    Schizophrenia ([place]    Toenail fungus 08/17/2019    Past Surgical History:  Procedure Laterality Date   TONSILLECTOMY      Family History  Problem Relation Age of Onset   Hypertension Mother    Cancer Father    Alcoholism Other     Social History   Socioeconomic History   Marital status: Single    Spouse name: Not on file   Number of children: Not on file   Years of education: Not on file  Highest education level: Not on file  Occupational History   Not on file  Tobacco Use   Smoking status: Every Day    Packs/day: 0.50    Types: Cigarettes   Smokeless tobacco: Never  Vaping Use   Vaping Use: Never used  Substance and Sexual Activity   Alcohol use: No   Drug use: No   Sexual activity: Yes  Other Topics Concern   Not on file  Social History Narrative   Not on file   Social Determinants of Health   Financial Resource Strain: Not on file  Food Insecurity: Not on file  Transportation Needs: Not on file  Physical Activity: Not on file  Stress: Not on file  Social Connections: Not on file  Intimate Partner Violence: Not on file     Outpatient Medications Prior to Visit  Medication Sig Dispense Refill   albuterol (VENTOLIN HFA) 108 (90 Base) MCG/ACT inhaler INHALE TWO PUFFS BY MOUTH INTO THE LUNGS EVERY 4 HOURS AS NEEDED FOR WHEEZING OR SHORTNESS OF BREATH 18 g 2   amLODipine (NORVASC) 10 MG tablet Take 1 tablet (10 mg total) by mouth daily. 90 tablet 3   ASPIRIN LOW DOSE 81 MG tablet TAKE ONE TABLET BY MOUTH DAILY 100 tablet 0   atorvastatin (LIPITOR) 20 MG tablet Take 1 tablet (20 mg total) by mouth daily. Reported on 05/01/2016 90 tablet 3   BD ULTRA-FINE PEN NEEDLES 29G X 12.7MM MISC USE WITH LANTUS PEN 100 each 1   Blood Glucose Monitoring Suppl (TRUE METRIX METER) w/Device KIT Use as instructed to check blood sugar daily. 1 kit 0   carvedilol (COREG) 25 MG tablet Take 1 tablet (25 mg total) by mouth 2 (two) times daily with a meal. 120 tablet 4   DULoxetine (CYMBALTA) 60 MG capsule Take 1 capsule (60 mg total) by mouth daily. 90 capsule 3   glucose blood (TRUE METRIX BLOOD GLUCOSE TEST) test strip Use as instructed to check blood sugar daily. 100 each 12   insulin glargine (LANTUS SOLOSTAR) 100 UNIT/ML Solostar Pen INJECT 30 UNITS INTO THE SKIN AT BEDTIME 15 mL 1   metFORMIN (GLUCOPHAGE) 1000 MG tablet Take 1 tablet (1,000 mg total) by mouth daily with breakfast. TAKE ONE TABLET BY MOUTH TWICE A DAY DAILY WITH A MEAL 90 tablet 2   OLANZapine (ZYPREXA) 15 MG tablet Take 1 tablet (15 mg total) by mouth at bedtime. 30 tablet 4   sildenafil (VIAGRA) 50 MG tablet TAKE TWO TABLETS BY MOUTH DAILY AS NEEDED FOR ERECTILE DYSFUNCTION 10 tablet 0   TRUEplus Lancets 28G MISC Use as instructed to check blood sugar daily. 854 each 11   TRULICITY 1.5 OE/7.0JJ SOPN INJECT 1.5 MG INTO THE SKIN ONCE A WEEK 2 mL 0   valsartan (DIOVAN) 320 MG tablet Take 1 tablet (320 mg total) by mouth daily. 90 tablet 2   Facility-Administered Medications Prior to Visit  Medication Dose Route Frequency Provider Last Rate Last Admin    glucose-Vitamin C 4-0.006 GM per chewable tablet 1 tablet  1 tablet Oral Once Elsie Stain, MD        Allergies  Allergen Reactions   Benadryl [Diphenhydramine Hcl]    Diphenhydramine     GI upset, paradoxic agitation   Haldol [Haloperidol Lactate]    Haldol [Haloperidol]     "makes me tremble, bite my tongue"   Lisinopril Swelling   Thorazine [Chlorpromazine]     Tremble, bite my tongue   Thorazine [Chlorpromazine]  ROS Review of Systems  Constitutional: Negative.  Negative for fatigue.  HENT: Negative.  Negative for ear pain.   Eyes:  Positive for visual disturbance.  Respiratory:  Negative for chest tightness and shortness of breath.   Cardiovascular:  Negative for chest pain and leg swelling.  Gastrointestinal:  Negative for constipation, diarrhea and nausea.  Genitourinary:  Negative for dysuria and frequency.       Erectile dysfunction  Musculoskeletal: Negative.   Skin: Negative.   Neurological:  Positive for numbness. Negative for dizziness and weakness.  Psychiatric/Behavioral:  Negative for dysphoric mood.       Objective:    Physical Exam Vitals reviewed.  Constitutional:      Appearance: Normal appearance. He is well-developed and normal weight. He is not diaphoretic.  HENT:     Head: Normocephalic and atraumatic.     Right Ear: External ear normal.     Left Ear: External ear normal.     Nose: No nasal deformity, septal deviation, mucosal edema or rhinorrhea.     Right Sinus: No maxillary sinus tenderness or frontal sinus tenderness.     Left Sinus: No maxillary sinus tenderness or frontal sinus tenderness.     Mouth/Throat:     Pharynx: No oropharyngeal exudate.  Eyes:     General: No scleral icterus.    Conjunctiva/sclera: Conjunctivae normal.     Pupils: Pupils are equal, round, and reactive to light.  Neck:     Thyroid: No thyromegaly.     Vascular: No carotid bruit or JVD.     Trachea: Trachea normal. No tracheal tenderness or tracheal  deviation.  Cardiovascular:     Rate and Rhythm: Normal rate and regular rhythm.     Chest Wall: PMI is not displaced.     Pulses: Normal pulses. No decreased pulses.     Heart sounds: Normal heart sounds, S1 normal and S2 normal. Heart sounds not distant. No murmur heard.    No systolic murmur is present.     No diastolic murmur is present.     No friction rub. No gallop. No S3 or S4 sounds.  Pulmonary:     Effort: Pulmonary effort is normal. No tachypnea, accessory muscle usage or respiratory distress.     Breath sounds: Normal breath sounds. No stridor. No decreased breath sounds, wheezing, rhonchi or rales.  Chest:     Chest wall: No tenderness.  Abdominal:     General: Bowel sounds are normal. There is no distension.     Palpations: Abdomen is soft. Abdomen is not rigid.     Tenderness: There is no abdominal tenderness. There is no guarding or rebound.  Musculoskeletal:        General: Normal range of motion.     Cervical back: Normal range of motion and neck supple. No edema, erythema or rigidity. No muscular tenderness. Normal range of motion.  Lymphadenopathy:     Head:     Right side of head: No submental or submandibular adenopathy.     Left side of head: No submental or submandibular adenopathy.     Cervical: No cervical adenopathy.  Skin:    General: Skin is warm and dry.     Coloration: Skin is not pale.     Findings: No rash.     Nails: There is no clubbing.  Neurological:     General: No focal deficit present.     Mental Status: He is alert and oriented to person, place, and time.  Sensory: No sensory deficit.  Psychiatric:        Attention and Perception: Attention normal.        Mood and Affect: Mood normal.        Speech: Speech normal.        Behavior: Behavior normal.     There were no vitals taken for this visit. Wt Readings from Last 3 Encounters:  07/03/22 213 lb 9.6 oz (96.9 kg)  01/31/22 218 lb 12.8 oz (99.2 kg)  11/29/21 213 lb 6.4 oz (96.8  kg)     Health Maintenance Due  Topic Date Due   COLON CANCER SCREENING ANNUAL FOBT  Never done   Zoster Vaccines- Shingrix (1 of 2) Never done   INFLUENZA VACCINE  07/24/2022   Diabetic kidney evaluation - eGFR measurement  11/29/2022    There are no preventive care reminders to display for this patient.  Lab Results  Component Value Date   TSH 0.992 10/31/2016   Lab Results  Component Value Date   WBC 9.6 11/29/2021   HGB 14.7 11/29/2021   HCT 44.9 11/29/2021   MCV 89 11/29/2021   PLT 201 11/29/2021   Lab Results  Component Value Date   NA 137 11/29/2021   K 4.4 11/29/2021   CO2 24 11/29/2021   GLUCOSE 117 (H) 11/29/2021   BUN 26 11/29/2021   CREATININE 1.34 (H) 11/29/2021   BILITOT 0.3 11/29/2021   ALKPHOS 68 11/29/2021   AST 23 11/29/2021   ALT 19 11/29/2021   PROT 7.3 11/29/2021   ALBUMIN 4.5 11/29/2021   CALCIUM 9.4 11/29/2021   ANIONGAP 8 05/31/2017   EGFR 61 11/29/2021   Lab Results  Component Value Date   CHOL 86 (L) 11/29/2021   Lab Results  Component Value Date   HDL 35 (L) 11/29/2021   Lab Results  Component Value Date   LDLCALC 37 11/29/2021   Lab Results  Component Value Date   TRIG 63 11/29/2021   Lab Results  Component Value Date   CHOLHDL 2.5 11/29/2021   Lab Results  Component Value Date   HGBA1C 7.2 (A) 07/03/2022      Assessment & Plan:   Problem List Items Addressed This Visit   None No orders of the defined types were placed in this encounter.   Follow-up: No follow-ups on file.    Asencion Noble, MD

## 2022-12-04 ENCOUNTER — Encounter: Payer: Self-pay | Admitting: Critical Care Medicine

## 2022-12-04 ENCOUNTER — Ambulatory Visit: Payer: Medicaid Other | Attending: Critical Care Medicine | Admitting: Critical Care Medicine

## 2022-12-04 VITALS — BP 128/78 | HR 81 | Temp 98.2°F | Wt 216.4 lb

## 2022-12-04 DIAGNOSIS — Z23 Encounter for immunization: Secondary | ICD-10-CM | POA: Insufficient documentation

## 2022-12-04 DIAGNOSIS — K029 Dental caries, unspecified: Secondary | ICD-10-CM | POA: Diagnosis not present

## 2022-12-04 DIAGNOSIS — Z7984 Long term (current) use of oral hypoglycemic drugs: Secondary | ICD-10-CM | POA: Insufficient documentation

## 2022-12-04 DIAGNOSIS — F17210 Nicotine dependence, cigarettes, uncomplicated: Secondary | ICD-10-CM | POA: Diagnosis not present

## 2022-12-04 DIAGNOSIS — F200 Paranoid schizophrenia: Secondary | ICD-10-CM | POA: Diagnosis not present

## 2022-12-04 DIAGNOSIS — E119 Type 2 diabetes mellitus without complications: Secondary | ICD-10-CM

## 2022-12-04 DIAGNOSIS — Z1211 Encounter for screening for malignant neoplasm of colon: Secondary | ICD-10-CM

## 2022-12-04 DIAGNOSIS — Z794 Long term (current) use of insulin: Secondary | ICD-10-CM | POA: Insufficient documentation

## 2022-12-04 DIAGNOSIS — E1100 Type 2 diabetes mellitus with hyperosmolarity without nonketotic hyperglycemic-hyperosmolar coma (NKHHC): Secondary | ICD-10-CM | POA: Diagnosis not present

## 2022-12-04 DIAGNOSIS — Z7985 Long-term (current) use of injectable non-insulin antidiabetic drugs: Secondary | ICD-10-CM | POA: Diagnosis not present

## 2022-12-04 DIAGNOSIS — E1140 Type 2 diabetes mellitus with diabetic neuropathy, unspecified: Secondary | ICD-10-CM | POA: Diagnosis not present

## 2022-12-04 DIAGNOSIS — N529 Male erectile dysfunction, unspecified: Secondary | ICD-10-CM | POA: Insufficient documentation

## 2022-12-04 DIAGNOSIS — Z79899 Other long term (current) drug therapy: Secondary | ICD-10-CM | POA: Insufficient documentation

## 2022-12-04 DIAGNOSIS — E1169 Type 2 diabetes mellitus with other specified complication: Secondary | ICD-10-CM

## 2022-12-04 DIAGNOSIS — I10 Essential (primary) hypertension: Secondary | ICD-10-CM | POA: Diagnosis not present

## 2022-12-04 DIAGNOSIS — Z760 Encounter for issue of repeat prescription: Secondary | ICD-10-CM | POA: Diagnosis present

## 2022-12-04 DIAGNOSIS — E1149 Type 2 diabetes mellitus with other diabetic neurological complication: Secondary | ICD-10-CM

## 2022-12-04 DIAGNOSIS — Z720 Tobacco use: Secondary | ICD-10-CM

## 2022-12-04 DIAGNOSIS — N5201 Erectile dysfunction due to arterial insufficiency: Secondary | ICD-10-CM

## 2022-12-04 DIAGNOSIS — E785 Hyperlipidemia, unspecified: Secondary | ICD-10-CM

## 2022-12-04 LAB — POCT GLYCOSYLATED HEMOGLOBIN (HGB A1C): HbA1c, POC (controlled diabetic range): 6.8 % (ref 0.0–7.0)

## 2022-12-04 LAB — GLUCOSE, POCT (MANUAL RESULT ENTRY): POC Glucose: 122 mg/dl — AB (ref 70–99)

## 2022-12-04 MED ORDER — DULOXETINE HCL 60 MG PO CPEP: 60.0000 mg | ORAL_CAPSULE | Freq: Every day | ORAL | 3 refills | Status: DC

## 2022-12-04 MED ORDER — ALBUTEROL SULFATE HFA 108 (90 BASE) MCG/ACT IN AERS: INHALATION_SPRAY | RESPIRATORY_TRACT | 2 refills | Status: DC

## 2022-12-04 MED ORDER — AMLODIPINE BESYLATE 10 MG PO TABS: 10.0000 mg | ORAL_TABLET | Freq: Every day | ORAL | 3 refills | Status: DC

## 2022-12-04 MED ORDER — CARVEDILOL 25 MG PO TABS: 25.0000 mg | ORAL_TABLET | Freq: Two times a day (BID) | ORAL | 4 refills | Status: DC

## 2022-12-04 MED ORDER — OLANZAPINE 15 MG PO TABS: 15.0000 mg | ORAL_TABLET | Freq: Every day | ORAL | 4 refills | Status: DC

## 2022-12-04 MED ORDER — TRULICITY 1.5 MG/0.5ML ~~LOC~~ SOAJ: 1.5000 mg | SUBCUTANEOUS | 4 refills | Status: DC

## 2022-12-04 MED ORDER — TRUE METRIX BLOOD GLUCOSE TEST VI STRP: ORAL_STRIP | 12 refills | Status: DC

## 2022-12-04 MED ORDER — SILDENAFIL CITRATE 50 MG PO TABS: ORAL_TABLET | ORAL | 1 refills | Status: DC

## 2022-12-04 MED ORDER — ATORVASTATIN CALCIUM 20 MG PO TABS: 20.0000 mg | ORAL_TABLET | Freq: Every day | ORAL | 3 refills | Status: DC

## 2022-12-04 MED ORDER — METFORMIN HCL 1000 MG PO TABS: 1000.0000 mg | ORAL_TABLET | Freq: Every day | ORAL | 2 refills | Status: DC

## 2022-12-04 MED ORDER — BD PEN NEEDLE ORIGINAL U/F 29G X 12.7MM MISC: 1 refills | Status: DC

## 2022-12-04 MED ORDER — TRUEPLUS LANCETS 28G MISC: 11 refills | Status: DC

## 2022-12-04 MED ORDER — PRAZOSIN HCL 2 MG PO CAPS: 2.0000 mg | ORAL_CAPSULE | Freq: Every day | ORAL | 2 refills | Status: DC

## 2022-12-04 MED ORDER — VALSARTAN 320 MG PO TABS: 320.0000 mg | ORAL_TABLET | Freq: Every day | ORAL | 2 refills | Status: DC

## 2022-12-04 MED ORDER — NICOTINE POLACRILEX 4 MG MT LOZG: LOZENGE | OROMUCOSAL | 4 refills | Status: AC

## 2022-12-04 MED ORDER — LANTUS SOLOSTAR 100 UNIT/ML ~~LOC~~ SOPN: 30.0000 [IU] | PEN_INJECTOR | Freq: Every day | SUBCUTANEOUS | 1 refills | Status: DC

## 2022-12-04 NOTE — Assessment & Plan Note (Signed)
Multiple dental caries referral to dentist made that takes Medicaid

## 2022-12-04 NOTE — Assessment & Plan Note (Signed)
A1c at goal continue with Lantus and metformin and Trulicity refills given

## 2022-12-04 NOTE — Assessment & Plan Note (Signed)
Blood pressure reasonably well-controlled continue amlodipine daily carvedilol twice daily and valsartan daily

## 2022-12-04 NOTE — Assessment & Plan Note (Signed)
Diabetes now controlled see diabetes neuropathy assessment

## 2022-12-04 NOTE — Assessment & Plan Note (Signed)
Continue with Viagra

## 2022-12-04 NOTE — Assessment & Plan Note (Signed)
Reissue fecal occult test

## 2022-12-04 NOTE — Assessment & Plan Note (Signed)
    Current smoking consumption amount: 5 cigarettes a day  Dicsussion on advise to quit smoking and smoking impacts: Lung impacts cardiovascular impacts  Patient's willingness to quit: Wanting to reduce smoking  Methods to quit smoking discussed:   Behavioral modification Medication management of smoking session drugs discussed: None indicated  Resources provided:  AVS   Setting quit date not established  Follow-up arranged 4 months   Time spent counseling the patient: 5 minutes

## 2022-12-04 NOTE — Patient Instructions (Signed)
Stop smoking use nicotine lozenges  Flu vaccine given  Referral to dentist was made  No change in medications excellent job on your diabetes need to reduce your tobacco intake further with a nicotine lozenge  Labs today include metabolic panel blood count and lipid panel for cholesterol check  Return to Dr. Delford Field 4 months

## 2022-12-04 NOTE — Assessment & Plan Note (Signed)
Doing well with community-based ACT team therapy

## 2022-12-05 LAB — CBC WITH DIFFERENTIAL/PLATELET
Basophils Absolute: 0.1 10*3/uL (ref 0.0–0.2)
Basos: 1 %
EOS (ABSOLUTE): 0.8 10*3/uL — ABNORMAL HIGH (ref 0.0–0.4)
Eos: 8 %
Hematocrit: 49.4 % (ref 37.5–51.0)
Hemoglobin: 16.4 g/dL (ref 13.0–17.7)
Immature Grans (Abs): 0.1 10*3/uL (ref 0.0–0.1)
Immature Granulocytes: 1 %
Lymphocytes Absolute: 1.8 10*3/uL (ref 0.7–3.1)
Lymphs: 17 %
MCH: 30.4 pg (ref 26.6–33.0)
MCHC: 33.2 g/dL (ref 31.5–35.7)
MCV: 92 fL (ref 79–97)
Monocytes Absolute: 1 10*3/uL — ABNORMAL HIGH (ref 0.1–0.9)
Monocytes: 9 %
Neutrophils Absolute: 6.9 10*3/uL (ref 1.4–7.0)
Neutrophils: 64 %
Platelets: 151 10*3/uL (ref 150–450)
RBC: 5.4 x10E6/uL (ref 4.14–5.80)
RDW: 13.2 % (ref 11.6–15.4)
WBC: 10.7 10*3/uL (ref 3.4–10.8)

## 2022-12-05 LAB — RENAL FUNCTION PANEL
Albumin: 4.2 g/dL (ref 3.9–4.9)
BUN/Creatinine Ratio: 12 (ref 10–24)
BUN: 16 mg/dL (ref 8–27)
CO2: 26 mmol/L (ref 20–29)
Calcium: 9.3 mg/dL (ref 8.6–10.2)
Chloride: 103 mmol/L (ref 96–106)
Creatinine, Ser: 1.3 mg/dL — ABNORMAL HIGH (ref 0.76–1.27)
Glucose: 98 mg/dL (ref 70–99)
Phosphorus: 3.1 mg/dL (ref 2.8–4.1)
Potassium: 4.5 mmol/L (ref 3.5–5.2)
Sodium: 141 mmol/L (ref 134–144)
eGFR: 63 mL/min/{1.73_m2} (ref >59)

## 2022-12-05 LAB — MICROALBUMIN / CREATININE URINE RATIO
Creatinine, Urine: 400.1 mg/dL
Microalb/Creat Ratio: 64 mg/g creat — ABNORMAL HIGH (ref 0–29)
Microalbumin, Urine: 256.9 ug/mL

## 2022-12-05 LAB — LIPID PANEL
Chol/HDL Ratio: 2.4 ratio (ref 0.0–5.0)
Cholesterol, Total: 94 mg/dL — ABNORMAL LOW (ref 100–199)
HDL: 39 mg/dL — ABNORMAL LOW (ref >39)
LDL Chol Calc (NIH): 39 mg/dL (ref 0–99)
Triglycerides: 76 mg/dL (ref 0–149)
VLDL Cholesterol Cal: 16 mg/dL (ref 5–40)

## 2023-01-08 ENCOUNTER — Other Ambulatory Visit: Payer: Self-pay | Admitting: Critical Care Medicine

## 2023-01-08 DIAGNOSIS — I10 Essential (primary) hypertension: Secondary | ICD-10-CM

## 2023-02-01 ENCOUNTER — Other Ambulatory Visit: Payer: Self-pay | Admitting: Critical Care Medicine

## 2023-02-01 NOTE — Telephone Encounter (Signed)
Requested Prescriptions  Pending Prescriptions Disp Refills   LANTUS SOLOSTAR 100 UNIT/ML Solostar Pen [Pharmacy Med Name: LANTUS SOLOSTAR 100U/ML INJ] 15 mL 0    Sig: INJECT 30 UNITS INTO THE SKIN AT BEDTIME     Endocrinology:  Diabetes - Insulins Passed - 02/01/2023  1:44 PM      Passed - HBA1C is between 0 and 7.9 and within 180 days    HbA1c, POC (controlled diabetic range)  Date Value Ref Range Status  12/04/2022 6.8 0.0 - 7.0 % Final         Passed - Valid encounter within last 6 months    Recent Outpatient Visits           1 month ago Controlled type 2 diabetes mellitus without complication, without long-term current use of insulin ([place])   [place] Elsie Stain, MD   7 months ago Type 2 diabetes mellitus with hyperosmolarity without coma, with long-term current use of insulin [facility])   [place] Elsie Stain, MD   1 year ago Type 2 diabetes mellitus with hyperosmolarity without coma, with long-term current use of insulin [facility])   [place] Elsie Stain, MD   1 year ago Type 2 diabetes mellitus with hyperosmolarity without coma, with long-term current use of insulin [facility])   [place] Elsie Stain, MD   1 year ago DM (diabetes mellitus), type 2 with neurological complications [facility])   [place] Elsie Stain, MD       Future Appointments             In 2 months Joya Gaskins Burnett Harry, MD [place]

## 2023-02-21 ENCOUNTER — Encounter: Payer: Self-pay | Admitting: Physician Assistant

## 2023-02-21 ENCOUNTER — Ambulatory Visit: Payer: Medicaid Other | Admitting: Physician Assistant

## 2023-02-21 VITALS — BP 131/73 | HR 72 | Ht 72.0 in | Wt 210.0 lb

## 2023-02-21 DIAGNOSIS — L02225 Furuncle of perineum: Secondary | ICD-10-CM

## 2023-02-21 MED ORDER — DOXYCYCLINE HYCLATE 100 MG PO CAPS: 100.0000 mg | ORAL_CAPSULE | Freq: Two times a day (BID) | ORAL | 0 refills | Status: DC

## 2023-02-21 NOTE — Progress Notes (Signed)
Established Patient Office Visit  Subjective   Patient ID: [name], male    DOB: Apr 26, 1961  Age: 62 y.o. MRN: [id number]  Chief Complaint  Patient presents with   Groin Pain    Noticed 3 days ago,Pt had prostate exam, but no Colon screening     States that he started having groin pain approx 3 days ago.  Describes it as a tender knot between scrotum and anus, denies drainage.    Denies injury or trauma  Has not tried anything for relief     Past Medical History:  Diagnosis Date   COVID-19 virus infection 01/20/2020   Dental abscess 10/11/2020   Diabetes mellitus    Diabetic neuropathy (HCC)    High cholesterol    Hypertension    Schizophrenia ([place])    Toenail fungus 08/17/2019   Social History   Socioeconomic History   Marital status: Single    Spouse name: Not on file   Number of children: Not on file   Years of education: Not on file   Highest education level: Not on file  Occupational History   Not on file  Tobacco Use   Smoking status: Every Day    Packs/day: 0.50    Types: Cigarettes   Smokeless tobacco: Never  Vaping Use   Vaping Use: Never used  Substance and Sexual Activity   Alcohol use: No   Drug use: No   Sexual activity: Yes  Other Topics Concern   Not on file  Social History Narrative   Not on file   Social Determinants of Health   Financial Resource Strain: Not on file  Food Insecurity: Not on file  Transportation Needs: Not on file  Physical Activity: Not on file  Stress: Not on file  Social Connections: Not on file  Intimate Partner Violence: Not on file   Family History  Problem Relation Age of Onset   Hypertension Mother    Cancer Father    Alcoholism Other    Allergies  Allergen Reactions   Benadryl [Diphenhydramine Hcl]    Diphenhydramine     GI upset, paradoxic agitation   Haldol [Haloperidol Lactate]    Haldol [Haloperidol]     "makes me tremble, bite my tongue"   Lisinopril Swelling   Thorazine  [Chlorpromazine]     Tremble, bite my tongue   Thorazine [Chlorpromazine]     Review of Systems  Constitutional:  Negative for chills and fever.  HENT: Negative.    Eyes: Negative.   Respiratory:  Negative for shortness of breath.   Gastrointestinal:  Negative for abdominal pain, nausea and vomiting.  Genitourinary:  Negative for dysuria, hematuria and urgency.  Musculoskeletal: Negative.   Skin: Negative.   Neurological: Negative.   Endo/Heme/Allergies: Negative.   Psychiatric/Behavioral: Negative.        Objective:     BP 131/73   Pulse 72   Ht 6' (1.829 m)   Wt 210 lb (95.3 kg)   SpO2 98%   BMI 28.48 kg/m  BP Readings from Last 3 Encounters:  02/21/23 131/73  12/04/22 128/78  07/03/22 139/84   Wt Readings from Last 3 Encounters:  02/21/23 210 lb (95.3 kg)  12/04/22 216 lb 6.4 oz (98.2 kg)  07/03/22 213 lb 9.6 oz (96.9 kg)      Physical Exam Vitals and nursing note reviewed. Exam conducted with a chaperone present.  Constitutional:      Appearance: Normal appearance.  HENT:     Head: Normocephalic and  atraumatic.     Right Ear: External ear normal.     Left Ear: External ear normal.     Nose: Nose normal.     Mouth/Throat:     Mouth: Mucous membranes are moist.     Pharynx: Oropharynx is clear.  Eyes:     Extraocular Movements: Extraocular movements intact.     Conjunctiva/sclera: Conjunctivae normal.     Pupils: Pupils are equal, round, and reactive to light.  Cardiovascular:     Rate and Rhythm: Normal rate and regular rhythm.     Pulses: Normal pulses.     Heart sounds: Normal heart sounds.  Pulmonary:     Effort: Pulmonary effort is normal.  Genitourinary:    Penis: Normal.      Testes: Normal.     Comments: Fluctuant, non-erythematic  nodule, tender to light tough in perineum area Musculoskeletal:        General: Normal range of motion.     Cervical back: Normal range of motion and neck supple.  Skin:    General: Skin is warm and dry.   Neurological:     General: No focal deficit present.     Mental Status: He is alert and oriented to person, place, and time.  Psychiatric:        Mood and Affect: Mood normal.        Behavior: Behavior normal.        Thought Content: Thought content normal.        Judgment: Judgment normal.       Assessment & Plan:   Problem List Items Addressed This Visit   None Visit Diagnoses     Furuncle of perineum    -  Primary   Relevant Medications   doxycycline (VIBRAMYCIN) 100 MG capsule      1. Furuncle of perineum Trial doxycycline.  Patient education given on supportive care.  Red flags given for prompt reevaluation. - doxycycline (VIBRAMYCIN) 100 MG capsule; Take 1 capsule (100 mg total) by mouth 2 (two) times daily.  Dispense: 20 capsule; Refill: 0   I have reviewed the patient's medical history (PMH, PSH, Social History, Family History, Medications, and allergies) , and have been updated if relevant. I spent 20 minutes reviewing chart and  face to face time with patient.    Return if symptoms worsen or fail to improve.    Loraine Grip [place], PA-C

## 2023-02-21 NOTE — Patient Instructions (Signed)
You are going to take doxycycline twice a day for 10 days.  You can use warm compresses to help with the discomfort.  Please let us know if there is anything else we can do for you.  Kennieth Rad, PA-C Physician Assistant [facility] [email or web address]   Skin Abscess  A skin abscess is an infected area on or under your skin. It contains pus and other material. An abscess may also be called a furuncle, carbuncle, or boil. It is often the result of an infection caused by bacteria. An abscess can occur in or on almost any part of your body. Sometimes, an abscess may break open (rupture) on its own. In most cases, it will keep getting worse unless it is treated. An abscess can cause pain and make you feel ill. An untreated abscess can cause infection to spread to other parts of your body or your bloodstream. The abscess may need to be drained. You may also need to take antibiotics. What are the causes? An abscess occurs when germs, like bacteria, pass through your skin and cause an infection. This may be caused by: A scrape or cut on your skin. A puncture wound through your skin, such as a needle injection or insect bite. Blocked oil or sweat glands. Blocked and infected hair follicles. A fluid-filled sac that forms beneath your skin (sebaceous cyst) and becomes infected. What increases the risk? You may be more likely to develop an abscess if: You have problems with blood circulation, or you have a weak body defense system (immune system). You have diabetes. You have dry and irritated skin. You get injections often or use IV drugs. You have a foreign body in a wound, such as a splinter. You smoke or use tobacco products. What are the signs or symptoms? Symptoms of this condition include: A painful, firm bump under the skin. A bump with pus at the top. This may break through the skin and drain. Other symptoms include: Redness and  swelling around the abscess. Warmth or tenderness. Swelling of the lymph nodes (glands) near the abscess. A sore on the skin. How is this diagnosed? This condition may be diagnosed based on a physical exam and your medical history. You may also have tests done, such as: A test of a sample of pus. This may be done to find what is causing the infection. Blood tests. Imaging tests, such as an ultrasound, CT scan, or MRI. How is this treated? A small abscess that drains on its own may not need to be treated. Treatment for larger abscesses may include: Moist heat or a heat pack applied to the area a few times a day. Incision and drainage. This is a procedure to drain the abscess. Antibiotics. For a severe abscess, you may first get antibiotics through an IV and then change to antibiotics by mouth. Follow these instructions at home: Medicines Take over-the-counter and prescription medicines only as told by your provider. If you were prescribed antibiotics, take them as told by your provider. Do not stop using the antibiotic even if you start to feel better. Abscess care  If you have an abscess that has not drained, apply heat to the affected area. Use the heat source that your provider recommends, such as a moist heat pack or a heating pad. Place a towel between your skin and the heat source. Leave the heat on for 20-30 minutes at a time. If your skin turns bright red, remove the heat right away to  prevent burns. The risk of burns is higher if you cannot feel pain, heat, or cold. Follow instructions from your provider about how to take care of your abscess. Make sure you: Cover the abscess with a bandage (dressing). Wash your hands with soap and water for at least 20 seconds before and after you change the dressing or gauze. If soap and water are not available, use hand sanitizer. Change your dressing or gauze as told by your provider. Check your abscess every day for signs of an infection that  is getting worse. Check for: More redness, swelling, pain, or tenderness. More fluid or blood. Warmth. More pus or a worse smell. General instructions To avoid spreading the infection: Do not share personal care items, towels, or hot tubs with others. Avoid making skin contact with other people. Be careful when getting rid of used dressings, wound packing, or any drainage from the abscess. Do not use any products that contain nicotine or tobacco. These products include cigarettes, chewing tobacco, and vaping devices, such as e-cigarettes. If you need help quitting, ask your provider. Do not use any creams, ointments, or liquids unless you have been told to by your provider. Contact a health care provider if: You see redness that spreads quickly or red streaks on your skin spreading away from the abscess. You have any signs of worse infection at the abscess. You vomit every time you eat or drink. You have a fever, chills, or muscle aches. The cyst or abscess returns. Get help right away if: You have severe pain. You make less pee (urine) than normal. This information is not intended to replace advice given to you by your health care provider. Make sure you discuss any questions you have with your health care provider. Document Revised: 07/25/2022 Document Reviewed: 07/25/2022 Elsevier Patient Education  [place].

## 2023-02-27 ENCOUNTER — Ambulatory Visit: Payer: Medicaid Other | Admitting: Podiatry

## 2023-03-04 ENCOUNTER — Other Ambulatory Visit: Payer: Self-pay | Admitting: Critical Care Medicine

## 2023-04-08 NOTE — Progress Notes (Unsigned)
Established Patient Office Visit  Subjective:  Patient ID: [name], male    DOB: 05-30-1961  Age: 62 y.o. MRN: [id number]  CC:  No chief complaint on file.   HPI 01/2022 [name] presents for diabetes follow up. He stated that he was feeling weak, and when the CMA took his blood sugar, it was 44 mg/dL. He was given a glucose tablet, orange juice, and goldfish crackers in an attempt to raise his blood glucose. 45 minutes later, his blood sugar had increased to 162 mg/dL. Prior to coming to the office today, he had eaten two eggs around 4 AM.   The patient states that he does not take his blood sugars at home. If he feels weakness like he did upon presentation to the office, he will eat a snack and tends to feel better afterwards. He has made concerted efforts to improve his diet, and eat healthier foods including fruits and vegetables. He reports that he takes his diabetes medications as prescribed, including weekly Trulicity, metformin 1000 mg twice daily, and Lantus insulin, 50 units at bedtime. He reports no other diabetic symptoms, except for his continued neuropathy in the hands and feet.  The patient also has a history of hypertension. He is currently taking amlodipine 10 mg daily, carvedilol 25mg  twice daily, and valsartan-HCTZ 320-25 daily. His blood pressure was excellent upon measurement in office today, at 114/78 mmHg. He is pleased with this result, as he has been making concerted efforts at lifestyle management.  He has a concern that the Cialis he has been taking for erectile dysfunction is not working. He would like to try a different medication. His other concern is regarding his vision because he is not seeing as well as he used to. He has not been to an ophthalmologist in over a year, and would like an appointment to see one.  He continues to smoke, but has decreased his usage to about 6 cigarettes per day, a decrease from about 1 PPD. He says that he tends to  smoke when he is reading his Bible or writing.  The patient is due for colon cancer screening. An FOBT kit was ordered at his last visit, but he ended up being unable to pick it up. He plans on picking the kit up today after his appointment.  7//11 Patient is seen in return visit and doing well overall arrival blood pressure 139/84 blood sugar 84 A1c though is up to 7.3.  Patient is still smoking about 6 cigarettes daily.  He is trying to be healthier diet.  He has not been able to reduce his tobacco intake however.  Patient states he has not been successful with his erectile dysfunction on the lower dose of Viagra would like a higher dose.  12/12 Patient seen in return follow-up on arrival blood sugar is 122 blood pressure 128/78.  Patient is smoking some black and milds but off other cigarettes.  He agrees to and received the flu vaccine.  He would like a dental referral he has Medicaid.  He is accompanied by his [facility] community support person Ali Chuk.  He is followed by the community ACT team.  He now has housing.  He is doing much better overall.  Eating more healthy.  Patient does need colon cancer screening urine albumin and metabolic panel and flu shot.  Patient has some dental problems would like a dental referral Past Medical History:  Diagnosis Date   COVID-19 virus infection 01/20/2020   Dental abscess 10/11/2020  Diabetes mellitus    Diabetic neuropathy (HCC)    High cholesterol    Hypertension    Schizophrenia (HCC)    Toenail fungus 08/17/2019    Past Surgical History:  Procedure Laterality Date   TONSILLECTOMY      Family History  Problem Relation Age of Onset   Hypertension Mother    Cancer Father    Alcoholism Other     Social History   Socioeconomic History   Marital status: Single    Spouse name: Not on file   Number of children: Not on file   Years of education: Not on file   Highest education level: Not on file  Occupational History   Not on file   Tobacco Use   Smoking status: Every Day    Packs/day: .5    Types: Cigarettes   Smokeless tobacco: Never  Vaping Use   Vaping Use: Never used  Substance and Sexual Activity   Alcohol use: No   Drug use: No   Sexual activity: Yes  Other Topics Concern   Not on file  Social History Narrative   Not on file   Social Determinants of Health   Financial Resource Strain: Not on file  Food Insecurity: Not on file  Transportation Needs: Not on file  Physical Activity: Not on file  Stress: Not on file  Social Connections: Not on file  Intimate Partner Violence: Not on file    Outpatient Medications Prior to Visit  Medication Sig Dispense Refill   albuterol (VENTOLIN HFA) 108 (90 Base) MCG/ACT inhaler INHALE TWO PUFFS BY MOUTH INTO THE LUNGS EVERY 4 HOURS AS NEEDED FOR WHEEZING OR SHORTNESS OF BREATH 18 g 2   amLODipine (NORVASC) 10 MG tablet Take 1 tablet (10 mg total) by mouth daily. 90 tablet 3   ASPIRIN LOW DOSE 81 MG tablet TAKE ONE TABLET BY MOUTH DAILY 100 tablet 0   atorvastatin (LIPITOR) 20 MG tablet Take 1 tablet (20 mg total) by mouth daily. Reported on 05/01/2016 90 tablet 3   BD ULTRA-FINE PEN NEEDLES 29G X 12.7MM MISC USE WITH LANTUS PEN 100 each 1   Blood Glucose Monitoring Suppl (TRUE METRIX METER) w/Device KIT Use as instructed to check blood sugar daily. 1 kit 0   carvedilol (COREG) 25 MG tablet Take 1 tablet (25 mg total) by mouth 2 (two) times daily with a meal. 120 tablet 4   doxycycline (VIBRAMYCIN) 100 MG capsule Take 1 capsule (100 mg total) by mouth 2 (two) times daily. 20 capsule 0   Dulaglutide (TRULICITY) 1.5 MG/0.5ML SOPN Inject 1.5 mg into the skin once a week. 2 mL 4   DULoxetine (CYMBALTA) 60 MG capsule Take 1 capsule (60 mg total) by mouth daily. 90 capsule 3   glucose blood (TRUE METRIX BLOOD GLUCOSE TEST) test strip Use as instructed to check blood sugar daily. 100 each 12   insulin glargine (LANTUS SOLOSTAR) 100 UNIT/ML Solostar Pen INJECT 30 UNITS INTO  THE SKIN AT BEDTIME 15 mL 1   metFORMIN (GLUCOPHAGE) 1000 MG tablet Take 1 tablet (1,000 mg total) by mouth daily with breakfast. 90 tablet 2   nicotine polacrilex (NICORETTE MINI) 4 MG lozenge Use three times a day to quit smoking 100 tablet 4   OLANZapine (ZYPREXA) 15 MG tablet Take 1 tablet (15 mg total) by mouth at bedtime. 30 tablet 4   prazosin (MINIPRESS) 2 MG capsule Take 1 capsule (2 mg total) by mouth daily. 60 capsule 2   sildenafil (VIAGRA)  50 MG tablet TAKE TWO TABLETS BY MOUTH DAILY AS NEEDED FOR ERECTILE DYSFUNCTION 10 tablet 1   TRUEplus Lancets 28G MISC Use as instructed to check blood sugar daily. 100 each 11   valsartan (DIOVAN) 320 MG tablet Take 1 tablet (320 mg total) by mouth daily. 90 tablet 2   Facility-Administered Medications Prior to Visit  Medication Dose Route Frequency Provider Last Rate Last Admin   glucose-Vitamin C 4-0.006 GM per chewable tablet 1 tablet  1 tablet Oral Once Storm Frisk, MD        Allergies  Allergen Reactions   Benadryl [Diphenhydramine Hcl]    Diphenhydramine     GI upset, paradoxic agitation   Haldol [Haloperidol Lactate]    Haldol [Haloperidol]     "makes me tremble, bite my tongue"   Lisinopril Swelling   Thorazine [Chlorpromazine]     Tremble, bite my tongue   Thorazine [Chlorpromazine]     ROS Review of Systems  Constitutional: Negative.  Negative for fatigue.  HENT:  Positive for dental problem. Negative for ear pain.   Eyes:  Negative for visual disturbance.  Respiratory:  Negative for chest tightness and shortness of breath.   Cardiovascular:  Negative for chest pain and leg swelling.  Gastrointestinal:  Negative for constipation, diarrhea and nausea.  Genitourinary:  Negative for dysuria and frequency.       Erectile dysfunction  Musculoskeletal: Negative.   Skin: Negative.   Neurological:  Negative for dizziness, weakness and numbness.  Psychiatric/Behavioral:  Negative for dysphoric mood.        Objective:    Physical Exam Vitals reviewed.  Constitutional:      Appearance: Normal appearance. He is well-developed and normal weight. He is not diaphoretic.  HENT:     Head: Normocephalic and atraumatic.     Right Ear: External ear normal.     Left Ear: External ear normal.     Nose: No nasal deformity, septal deviation, mucosal edema or rhinorrhea.     Right Sinus: No maxillary sinus tenderness or frontal sinus tenderness.     Left Sinus: No maxillary sinus tenderness or frontal sinus tenderness.     Mouth/Throat:     Mouth: Mucous membranes are moist.     Pharynx: Oropharynx is clear. No oropharyngeal exudate.     Comments: Poor dentition Eyes:     General: No scleral icterus.    Conjunctiva/sclera: Conjunctivae normal.     Pupils: Pupils are equal, round, and reactive to light.  Neck:     Thyroid: No thyromegaly.     Vascular: No carotid bruit or JVD.     Trachea: Trachea normal. No tracheal tenderness or tracheal deviation.  Cardiovascular:     Rate and Rhythm: Normal rate and regular rhythm.     Chest Wall: PMI is not displaced.     Pulses: Normal pulses. No decreased pulses.     Heart sounds: Normal heart sounds, S1 normal and S2 normal. Heart sounds not distant. No murmur heard.    No systolic murmur is present.     No diastolic murmur is present.     No friction rub. No gallop. No S3 or S4 sounds.  Pulmonary:     Effort: Pulmonary effort is normal. No tachypnea, accessory muscle usage or respiratory distress.     Breath sounds: Normal breath sounds. No stridor. No decreased breath sounds, wheezing, rhonchi or rales.  Chest:     Chest wall: No tenderness.  Abdominal:  General: Bowel sounds are normal. There is no distension.     Palpations: Abdomen is soft. Abdomen is not rigid.     Tenderness: There is no abdominal tenderness. There is no guarding or rebound.  Musculoskeletal:        General: Normal range of motion.     Cervical back: Normal range of  motion and neck supple. No edema, erythema or rigidity. No muscular tenderness. Normal range of motion.  Lymphadenopathy:     Head:     Right side of head: No submental or submandibular adenopathy.     Left side of head: No submental or submandibular adenopathy.     Cervical: No cervical adenopathy.  Skin:    General: Skin is warm and dry.     Coloration: Skin is not pale.     Findings: No rash.     Nails: There is no clubbing.  Neurological:     General: No focal deficit present.     Mental Status: He is alert and oriented to person, place, and time.     Sensory: No sensory deficit.  Psychiatric:        Attention and Perception: Attention normal.        Mood and Affect: Mood normal.        Speech: Speech normal.        Behavior: Behavior normal.     There were no vitals taken for this visit. Wt Readings from Last 3 Encounters:  02/21/23 210 lb (95.3 kg)  12/04/22 216 lb 6.4 oz (98.2 kg)  07/03/22 213 lb 9.6 oz (96.9 kg)     Health Maintenance Due  Topic Date Due   COLON CANCER SCREENING ANNUAL FOBT  Never done   Zoster Vaccines- Shingrix (1 of 2) Never done   FOOT EXAM  01/31/2023    There are no preventive care reminders to display for this patient.  Lab Results  Component Value Date   TSH 0.992 10/31/2016   Lab Results  Component Value Date   WBC 10.7 12/04/2022   HGB 16.4 12/04/2022   HCT 49.4 12/04/2022   MCV 92 12/04/2022   PLT 151 12/04/2022   Lab Results  Component Value Date   NA 141 12/04/2022   K 4.5 12/04/2022   CO2 26 12/04/2022   GLUCOSE 98 12/04/2022   BUN 16 12/04/2022   CREATININE 1.30 (H) 12/04/2022   BILITOT 0.3 11/29/2021   ALKPHOS 68 11/29/2021   AST 23 11/29/2021   ALT 19 11/29/2021   PROT 7.3 11/29/2021   ALBUMIN 4.2 12/04/2022   CALCIUM 9.3 12/04/2022   ANIONGAP 8 05/31/2017   EGFR 63 12/04/2022   Lab Results  Component Value Date   CHOL 94 (L) 12/04/2022   Lab Results  Component Value Date   HDL 39 (L) 12/04/2022    Lab Results  Component Value Date   LDLCALC 39 12/04/2022   Lab Results  Component Value Date   TRIG 76 12/04/2022   Lab Results  Component Value Date   CHOLHDL 2.4 12/04/2022   Lab Results  Component Value Date   HGBA1C 6.8 12/04/2022      Assessment & Plan:   Problem List Items Addressed This Visit   None No orders of the defined types were placed in this encounter. Flu vaccine given  Follow-up: No follow-ups on file.    Shan Levans, MD

## 2023-04-09 ENCOUNTER — Encounter: Payer: Self-pay | Admitting: Critical Care Medicine

## 2023-04-09 ENCOUNTER — Ambulatory Visit: Payer: Medicaid Other | Attending: Critical Care Medicine | Admitting: Critical Care Medicine

## 2023-04-09 VITALS — BP 135/75 | HR 73 | Wt 212.6 lb

## 2023-04-09 DIAGNOSIS — Z87891 Personal history of nicotine dependence: Secondary | ICD-10-CM | POA: Diagnosis not present

## 2023-04-09 DIAGNOSIS — K029 Dental caries, unspecified: Secondary | ICD-10-CM | POA: Diagnosis not present

## 2023-04-09 DIAGNOSIS — Z7985 Long-term (current) use of injectable non-insulin antidiabetic drugs: Secondary | ICD-10-CM | POA: Insufficient documentation

## 2023-04-09 DIAGNOSIS — F200 Paranoid schizophrenia: Secondary | ICD-10-CM | POA: Diagnosis not present

## 2023-04-09 DIAGNOSIS — Z1211 Encounter for screening for malignant neoplasm of colon: Secondary | ICD-10-CM | POA: Diagnosis not present

## 2023-04-09 DIAGNOSIS — Z794 Long term (current) use of insulin: Secondary | ICD-10-CM | POA: Diagnosis not present

## 2023-04-09 DIAGNOSIS — Z8249 Family history of ischemic heart disease and other diseases of the circulatory system: Secondary | ICD-10-CM | POA: Insufficient documentation

## 2023-04-09 DIAGNOSIS — Z8616 Personal history of COVID-19: Secondary | ICD-10-CM | POA: Insufficient documentation

## 2023-04-09 DIAGNOSIS — Z760 Encounter for issue of repeat prescription: Secondary | ICD-10-CM | POA: Insufficient documentation

## 2023-04-09 DIAGNOSIS — Z7984 Long term (current) use of oral hypoglycemic drugs: Secondary | ICD-10-CM | POA: Insufficient documentation

## 2023-04-09 DIAGNOSIS — Z79899 Other long term (current) drug therapy: Secondary | ICD-10-CM | POA: Diagnosis not present

## 2023-04-09 DIAGNOSIS — E1149 Type 2 diabetes mellitus with other diabetic neurological complication: Secondary | ICD-10-CM | POA: Diagnosis not present

## 2023-04-09 DIAGNOSIS — E1165 Type 2 diabetes mellitus with hyperglycemia: Secondary | ICD-10-CM | POA: Diagnosis not present

## 2023-04-09 DIAGNOSIS — I10 Essential (primary) hypertension: Secondary | ICD-10-CM

## 2023-04-09 DIAGNOSIS — N5201 Erectile dysfunction due to arterial insufficiency: Secondary | ICD-10-CM

## 2023-04-09 DIAGNOSIS — R61 Generalized hyperhidrosis: Secondary | ICD-10-CM | POA: Insufficient documentation

## 2023-04-09 DIAGNOSIS — B351 Tinea unguium: Secondary | ICD-10-CM

## 2023-04-09 DIAGNOSIS — N529 Male erectile dysfunction, unspecified: Secondary | ICD-10-CM | POA: Diagnosis not present

## 2023-04-09 LAB — POCT GLYCOSYLATED HEMOGLOBIN (HGB A1C): HbA1c, POC (controlled diabetic range): 7.7 % — AB (ref 0.0–7.0)

## 2023-04-09 LAB — GLUCOSE, POCT (MANUAL RESULT ENTRY): POC Glucose: 126 mg/dl — AB (ref 70–99)

## 2023-04-09 MED ORDER — SILDENAFIL CITRATE 50 MG PO TABS: ORAL_TABLET | ORAL | 1 refills | Status: DC

## 2023-04-09 MED ORDER — METFORMIN HCL 1000 MG PO TABS: 1000.0000 mg | ORAL_TABLET | Freq: Every day | ORAL | 2 refills | Status: DC

## 2023-04-09 MED ORDER — TRULICITY 1.5 MG/0.5ML ~~LOC~~ SOAJ: 1.5000 mg | SUBCUTANEOUS | 4 refills | Status: DC

## 2023-04-09 MED ORDER — ACCU-CHEK SOFTCLIX LANCETS MISC: 12 refills | Status: DC

## 2023-04-09 MED ORDER — OLANZAPINE 15 MG PO TABS: 15.0000 mg | ORAL_TABLET | Freq: Every day | ORAL | 4 refills | Status: DC

## 2023-04-09 MED ORDER — ACCU-CHEK GUIDE VI STRP: ORAL_STRIP | 12 refills | Status: DC

## 2023-04-09 MED ORDER — ATORVASTATIN CALCIUM 20 MG PO TABS: 20.0000 mg | ORAL_TABLET | Freq: Every day | ORAL | 3 refills | Status: DC

## 2023-04-09 MED ORDER — LANTUS SOLOSTAR 100 UNIT/ML ~~LOC~~ SOPN: 35.0000 [IU] | PEN_INJECTOR | Freq: Every day | SUBCUTANEOUS | 1 refills | Status: DC

## 2023-04-09 MED ORDER — BLOOD PRESSURE KIT DEVI
0 refills | Status: DC
Start: 2023-04-09 — End: 2023-09-18

## 2023-04-09 MED ORDER — PRAZOSIN HCL 2 MG PO CAPS: 2.0000 mg | ORAL_CAPSULE | Freq: Every day | ORAL | 2 refills | Status: DC

## 2023-04-09 MED ORDER — VALSARTAN 320 MG PO TABS: 320.0000 mg | ORAL_TABLET | Freq: Every day | ORAL | 2 refills | Status: DC

## 2023-04-09 MED ORDER — DULOXETINE HCL 60 MG PO CPEP: 60.0000 mg | ORAL_CAPSULE | Freq: Every day | ORAL | 3 refills | Status: DC

## 2023-04-09 MED ORDER — ACCU-CHEK GUIDE ME W/DEVICE KIT: PACK | 0 refills | Status: DC

## 2023-04-09 MED ORDER — AMLODIPINE BESYLATE 10 MG PO TABS: 10.0000 mg | ORAL_TABLET | Freq: Every day | ORAL | 3 refills | Status: DC

## 2023-04-09 MED ORDER — CARVEDILOL 25 MG PO TABS: 25.0000 mg | ORAL_TABLET | Freq: Two times a day (BID) | ORAL | 4 refills | Status: DC

## 2023-04-09 NOTE — Assessment & Plan Note (Signed)
Renew Viagra 

## 2023-04-09 NOTE — Assessment & Plan Note (Signed)
Referral to podiatry made

## 2023-04-09 NOTE — Assessment & Plan Note (Signed)
Repeat fecal occult study

## 2023-04-09 NOTE — Assessment & Plan Note (Signed)
No longer smoking 

## 2023-04-09 NOTE — Assessment & Plan Note (Signed)
Increase insulin glargine to 35 units a day

## 2023-04-09 NOTE — Assessment & Plan Note (Signed)
Blood pressure well-controlled continue amlodipine carvedilol valsartan and

## 2023-04-09 NOTE — Patient Instructions (Signed)
Referral to foot doctor made Increase lantus to 35 units daily, no other medication changes, refills sent to [facility] Blood pressure meter to be mailed to home from [facility]  Return Dr Delford Field 2 months

## 2023-04-09 NOTE — Assessment & Plan Note (Signed)
Patient already working with dental

## 2023-04-25 ENCOUNTER — Ambulatory Visit (INDEPENDENT_AMBULATORY_CARE_PROVIDER_SITE_OTHER): Payer: Medicaid Other | Admitting: Podiatry

## 2023-04-25 ENCOUNTER — Encounter: Payer: Self-pay | Admitting: Podiatry

## 2023-04-25 DIAGNOSIS — M79675 Pain in left toe(s): Secondary | ICD-10-CM

## 2023-04-25 DIAGNOSIS — E1149 Type 2 diabetes mellitus with other diabetic neurological complication: Secondary | ICD-10-CM

## 2023-04-25 DIAGNOSIS — B351 Tinea unguium: Secondary | ICD-10-CM

## 2023-04-25 DIAGNOSIS — E1140 Type 2 diabetes mellitus with diabetic neuropathy, unspecified: Secondary | ICD-10-CM

## 2023-04-25 DIAGNOSIS — M79674 Pain in right toe(s): Secondary | ICD-10-CM | POA: Diagnosis not present

## 2023-04-25 NOTE — Progress Notes (Signed)
Subjective:   Patient ID: [name], male   DOB: 62 y.o.   MRN: [id number]   HPI Long-term diabetic with neurological issues as far as reduced feeling who has severely thickened yellow brittle nailbeds 1-5 both feet   ROS      Objective:  Physical Exam  Vascular status intact neurological indicates diminished sharp dull vibratory thick yellow brittle nailbeds 1-5 both feet painful     Assessment:  Chronic mycotic nail infection with pain 1-5 both feet with at risk patient     Plan:  Debridement nailbeds 1-5 both feet nitrogen and bleeding reappoint routine care

## 2023-04-30 ENCOUNTER — Emergency Department (HOSPITAL_COMMUNITY)
Admission: EM | Admit: 2023-04-30 | Discharge: 2023-04-30 | Disposition: A | Discharge status: Home / Self Care | Payer: Medicaid Other | Attending: Emergency Medicine | Admitting: Emergency Medicine

## 2023-04-30 ENCOUNTER — Other Ambulatory Visit: Payer: Self-pay

## 2023-04-30 ENCOUNTER — Encounter (HOSPITAL_COMMUNITY): Payer: Self-pay

## 2023-04-30 DIAGNOSIS — Z8616 Personal history of COVID-19: Secondary | ICD-10-CM | POA: Insufficient documentation

## 2023-04-30 DIAGNOSIS — E119 Type 2 diabetes mellitus without complications: Secondary | ICD-10-CM | POA: Insufficient documentation

## 2023-04-30 DIAGNOSIS — B029 Zoster without complications: Secondary | ICD-10-CM

## 2023-04-30 DIAGNOSIS — I10 Essential (primary) hypertension: Secondary | ICD-10-CM | POA: Diagnosis not present

## 2023-04-30 DIAGNOSIS — Z79899 Other long term (current) drug therapy: Secondary | ICD-10-CM | POA: Diagnosis not present

## 2023-04-30 DIAGNOSIS — Z7984 Long term (current) use of oral hypoglycemic drugs: Secondary | ICD-10-CM | POA: Insufficient documentation

## 2023-04-30 DIAGNOSIS — Z7982 Long term (current) use of aspirin: Secondary | ICD-10-CM | POA: Diagnosis not present

## 2023-04-30 DIAGNOSIS — Z794 Long term (current) use of insulin: Secondary | ICD-10-CM | POA: Insufficient documentation

## 2023-04-30 DIAGNOSIS — R21 Rash and other nonspecific skin eruption: Secondary | ICD-10-CM | POA: Diagnosis present

## 2023-04-30 MED ORDER — OXYCODONE-ACETAMINOPHEN 5-325 MG PO TABS: 1.0000 | ORAL_TABLET | Freq: Four times a day (QID) | ORAL | 0 refills | Status: DC | PRN

## 2023-04-30 MED ORDER — DOCUSATE SODIUM 100 MG PO CAPS: 100.0000 mg | ORAL_CAPSULE | Freq: Two times a day (BID) | ORAL | 0 refills | Status: AC | PRN

## 2023-04-30 MED ORDER — VALACYCLOVIR HCL 1 G PO TABS: 1000.0000 mg | ORAL_TABLET | Freq: Three times a day (TID) | ORAL | 0 refills | Status: AC

## 2023-04-30 NOTE — ED Triage Notes (Signed)
Patient has had a red rash with clear blisters that appeared 2 days ago on the left side of his abdomen. Had chicken pox as a child.

## 2023-04-30 NOTE — ED Provider Notes (Signed)
[place] EMERGENCY DEPARTMENT AT [facility] Provider Note   CSN: [id number] Arrival date & time: 04/30/23  1206     History  Chief Complaint  Patient presents with   Rash    [name] is a 62 y.o. male.   Rash Patient presents with left side abdominal pain.  Feels as if he is having some constipation.  Has rash on the abdomen.  Reportedly been there for 2 days.    Past Medical History:  Diagnosis Date   COVID-19 virus infection 01/20/2020   Dental abscess 10/11/2020   Diabetes mellitus    Diabetic neuropathy (HCC)    High cholesterol    Hypertension    Schizophrenia (HCC)    Toenail fungus 08/17/2019    Home Medications Prior to Admission medications   Medication Sig Start Date End Date Taking? Authorizing Provider  docusate sodium (COLACE) 100 MG capsule Take 1 capsule (100 mg total) by mouth 2 (two) times daily as needed for mild constipation (While taking pain medicine). 04/30/23  Yes Benjiman Core, MD  oxyCODONE-acetaminophen (PERCOCET/ROXICET) 5-325 MG tablet Take 1 tablet by mouth every 6 (six) hours as needed for severe pain. 04/30/23  Yes Benjiman Core, MD  valACYclovir (VALTREX) 1000 MG tablet Take 1 tablet (1,000 mg total) by mouth 3 (three) times daily for 7 days. 04/30/23 05/07/23 Yes Benjiman Core, MD  Accu-Chek Softclix Lancets lancets Use as instructed 04/09/23   Storm Frisk, MD  albuterol (VENTOLIN HFA) 108 (90 Base) MCG/ACT inhaler INHALE TWO PUFFS BY MOUTH INTO THE LUNGS EVERY 4 HOURS AS NEEDED FOR WHEEZING OR SHORTNESS OF BREATH 03/04/23   Storm Frisk, MD  amLODipine (NORVASC) 10 MG tablet Take 1 tablet (10 mg total) by mouth daily. 04/09/23   Storm Frisk, MD  ASPIRIN LOW DOSE 81 MG tablet TAKE ONE TABLET BY MOUTH DAILY 01/08/23   Storm Frisk, MD  atorvastatin (LIPITOR) 20 MG tablet Take 1 tablet (20 mg total) by mouth daily. Reported on 05/01/2016 04/09/23   Storm Frisk, MD  BD ULTRA-FINE PEN NEEDLES 29G X  12.7MM MISC USE WITH LANTUS PEN 12/04/22   Storm Frisk, MD  Blood Glucose Monitoring Suppl (ACCU-CHEK GUIDE ME) w/Device KIT Check blood sugar twice daily 04/09/23   Storm Frisk, MD  Blood Pressure Monitoring (BLOOD PRESSURE KIT) DEVI Use to measure blood pressure 04/09/23   Storm Frisk, MD  carvedilol (COREG) 25 MG tablet Take 1 tablet (25 mg total) by mouth 2 (two) times daily with a meal. 04/09/23   Storm Frisk, MD  Dulaglutide (TRULICITY) 1.5 MG/0.5ML SOPN Inject 1.5 mg into the skin once a week. 04/09/23   Storm Frisk, MD  DULoxetine (CYMBALTA) 60 MG capsule Take 1 capsule (60 mg total) by mouth daily. 04/09/23   Storm Frisk, MD  glucose blood (ACCU-CHEK GUIDE) test strip Use as instructed 04/09/23   Storm Frisk, MD  insulin glargine (LANTUS SOLOSTAR) 100 UNIT/ML Solostar Pen Inject 35 Units into the skin at bedtime. 04/09/23   Storm Frisk, MD  metFORMIN (GLUCOPHAGE) 1000 MG tablet Take 1 tablet (1,000 mg total) by mouth daily with breakfast. 04/09/23   Storm Frisk, MD  nicotine polacrilex (NICORETTE MINI) 4 MG lozenge Use three times a day to quit smoking 12/04/22   Storm Frisk, MD  OLANZapine (ZYPREXA) 15 MG tablet Take 1 tablet (15 mg total) by mouth at bedtime. 04/09/23   Storm Frisk, MD  prazosin (  MINIPRESS) 2 MG capsule Take 1 capsule (2 mg total) by mouth daily. 04/09/23   Storm Frisk, MD  sildenafil (VIAGRA) 50 MG tablet TAKE TWO TABLETS BY MOUTH DAILY AS NEEDED FOR ERECTILE DYSFUNCTION 04/09/23   Storm Frisk, MD  valsartan (DIOVAN) 320 MG tablet Take 1 tablet (320 mg total) by mouth daily. 04/09/23   Storm Frisk, MD      Allergies    Benadryl [diphenhydramine hcl], Diphenhydramine, Haldol [haloperidol lactate], Haldol [haloperidol], Lisinopril, Thorazine [chlorpromazine], and Thorazine [chlorpromazine]    Review of Systems   Review of Systems  Skin:  Positive for rash.    Physical Exam Updated Vital  Signs BP (!) 154/77 (BP Location: Right Arm)   Pulse 84   Temp 99 F (37.2 C) (Oral)   Resp 18   Ht 6' (1.829 m)   Wt 94.8 kg   SpO2 96%   BMI 28.35 kg/m  Physical Exam Vitals and nursing note reviewed.  Cardiovascular:     Rate and Rhythm: Regular rhythm.  Abdominal:     Tenderness: There is abdominal tenderness.     Comments: Horizontal blistering rash to left mid abdomen.  No otherwise specific abdominal tenderness.  Musculoskeletal:        General: No tenderness.     Cervical back: Neck supple.  Neurological:     Mental Status: He is alert.     ED Results / Procedures / Treatments   Labs (all labs ordered are listed, but only abnormal results are displayed) Labs Reviewed - No data to display  EKG None  Radiology No results found.  Procedures Procedures    Medications Ordered in ED Medications - No data to display  ED Course/ Medical Decision Making/ A&P                             Medical Decision Making Risk OTC drugs. Prescription drug management.     Patient abdominal pain.  Has left mid abdominal zoster.  Will give pain medicine.  Will give Colace and states had some constipation.  Will give valacyclovir.  Symptoms began around 2 days ago.  No steroids due to diabetes.        Final Clinical Impression(s) / ED Diagnoses Final diagnoses:  Herpes zoster without complication    Rx / DC Orders ED Discharge Orders          Ordered    valACYclovir (VALTREX) 1000 MG tablet  3 times daily        04/30/23 1301    oxyCODONE-acetaminophen (PERCOCET/ROXICET) 5-325 MG tablet  Every 6 hours PRN        04/30/23 1301    docusate sodium (COLACE) 100 MG capsule  2 times daily PRN        04/30/23 1301              Benjiman Core, MD 04/30/23 [phone]

## 2023-05-28 ENCOUNTER — Other Ambulatory Visit: Payer: Self-pay | Admitting: Critical Care Medicine

## 2023-05-28 NOTE — Telephone Encounter (Signed)
Requested Prescriptions  Pending Prescriptions Disp Refills   albuterol (VENTOLIN HFA) 108 (90 Base) MCG/ACT inhaler [Pharmacy Med Name: VENTOLIN HFA 90MCG/IN AER] 18 g 1    Sig: INHALE TWO PUFFS BY MOUTH INTO THE LUNGS EVERY 4 HOURS AS NEEDED FOR WHEEZING OR SHORTNESS OF BREATH     Pulmonology:  Beta Agonists 2 Failed - 05/28/2023 12:39 PM      Failed - Last BP in normal range    BP Readings from Last 1 Encounters:  04/30/23 (!) 154/77         Passed - Last Heart Rate in normal range    Pulse Readings from Last 1 Encounters:  04/30/23 84         Passed - Valid encounter within last 12 months    Recent Outpatient Visits           1 month ago DM (diabetes mellitus), type 2 with neurological complications [facility])   [place] [facility] Storm Frisk, MD   5 months ago Controlled type 2 diabetes mellitus without complication, without long-term current use of insulin [facility])   [place] [facility] Storm Frisk, MD   10 months ago Type 2 diabetes mellitus with hyperosmolarity without coma, with long-term current use of insulin [facility])   [place] [facility] Storm Frisk, MD   1 year ago Type 2 diabetes mellitus with hyperosmolarity without coma, with long-term current use of insulin [facility])   [place] [facility] & [facility] Storm Frisk, MD   1 year ago Type 2 diabetes mellitus with hyperosmolarity without coma, with long-term current use of insulin [facility])   [place] [facility] & [facility] Storm Frisk, MD

## 2023-06-13 ENCOUNTER — Other Ambulatory Visit: Payer: Self-pay | Admitting: Critical Care Medicine

## 2023-06-13 DIAGNOSIS — I10 Essential (primary) hypertension: Secondary | ICD-10-CM

## 2023-06-24 ENCOUNTER — Other Ambulatory Visit: Payer: Self-pay | Admitting: Critical Care Medicine

## 2023-07-15 ENCOUNTER — Other Ambulatory Visit: Payer: Self-pay | Admitting: Critical Care Medicine

## 2023-07-25 ENCOUNTER — Other Ambulatory Visit: Payer: Self-pay | Admitting: Critical Care Medicine

## 2023-08-06 NOTE — Congregational Nurse Program (Signed)
Dept: [phone]   Congregational Nurse Program Note  Date of Encounter: 08/06/2023  Past Medical History: Past Medical History:  Diagnosis Date   COVID-19 virus infection 01/20/2020   Dental abscess 10/11/2020   Diabetes mellitus    Diabetic neuropathy (HCC)    High cholesterol    Hypertension    Schizophrenia (HCC)    Toenail fungus 08/17/2019    Encounter Details:  CNP Questionnaire - 08/06/23 1045       Questionnaire   Ask client: Do you give verbal consent for me to treat you today? Yes    Student Assistance N/A    Location Patient Served  [facility]    Visit Setting with Client Organization    Patient Status Unknown   [place] own apartment in [facility] Uninsured ([place] Card/Care Connects/Self-Pay/Medicaid Family Planning)    Insurance/Financial Assistance Referral Medicaid    Medication N/A    Medical Provider Yes    Screening Referrals Made N/A    Medical Referrals Made [facility]    Medical Appointment Made N/A    Recently w/o PCP, now 1st time PCP visit completed due to CNs referral or appointment made N/A    Food Have Food Insecurities;Referred to Food Pantry    Transportation N/A    Housing/Utilities N/A    Interpersonal Safety N/A    Interventions Advocate/Support;Counsel;Educate;Case Management;Reviewed Medications    Abnormal to Normal Screening Since Last CN Visit N/A    Screenings CN Performed Blood Pressure;Blood Glucose;Pulse Ox    Sent Client to Lab for: N/A    Did client attend any of the following based off CNs referral or appointments made? N/A    ED Visit Averted Yes    Life-Saving Intervention Made N/A               Dept: [phone]   Congregational Nurse Program Note  Date of Encounter: 08/06/2023  Clinic visit to check blood pressure and blood glucose.  States he has not been taking medications,  reviewed medications orders and referred to see PCP to obtain prescriptions for medication. Past Medical  History: Past Medical History:  Diagnosis Date   COVID-19 virus infection 01/20/2020   Dental abscess 10/11/2020   Diabetes mellitus    Diabetic neuropathy (HCC)    High cholesterol    Hypertension    Schizophrenia (HCC)    Toenail fungus 08/17/2019    Encounter Details:  CNP Questionnaire - 08/06/23 1045       Questionnaire   Ask client: Do you give verbal consent for me to treat you today? Yes    Student Assistance N/A    Location Patient Served  [facility]    Visit Setting with Client Organization    Patient Status Unknown   Has own apartment in [facility] Unknown    Insurance/Financial Assistance Referral Medicaid    Medication N/A    Medical Provider Yes    Screening Referrals Made N/A    Medical Referrals Made [facility]    Medical Appointment Made N/A    Recently w/o PCP, now 1st time PCP visit completed due to CNs referral or appointment made N/A    Food Have Food Insecurities;Referred to Food Pantry    Transportation N/A    Housing/Utilities N/A    Interpersonal Safety N/A    Interventions Advocate/Support;Counsel;Educate;Case Management;Reviewed Medications    Screenings CN Performed Blood Pressure;Blood Glucose;Pulse Ox    Sent Client to Lab for: N/A    Did client  attend any of the following based off CNs referral or appointments made? N/A    ED Visit Averted N/A    Life-Saving Intervention Made N/A

## 2023-08-12 ENCOUNTER — Other Ambulatory Visit: Payer: Self-pay | Admitting: Critical Care Medicine

## 2023-08-12 DIAGNOSIS — I10 Essential (primary) hypertension: Secondary | ICD-10-CM

## 2023-09-04 ENCOUNTER — Other Ambulatory Visit: Payer: Self-pay | Admitting: Critical Care Medicine

## 2023-09-10 ENCOUNTER — Other Ambulatory Visit: Payer: Self-pay | Admitting: Critical Care Medicine

## 2023-09-11 NOTE — Telephone Encounter (Signed)
Requested Prescriptions  Pending Prescriptions Disp Refills   TRULICITY 1.5 MG/0.5ML SOPN [Pharmacy Med Name: TRULICITY 1.5MG /0.5ML INJ] 2 mL 3    Sig: INJECT 1.5 MG INTO THE SKIN ONCE A WEEK     Endocrinology:  Diabetes - GLP-1 Receptor Agonists Passed - 09/10/2023 12:51 PM      Passed - HBA1C is between 0 and 7.9 and within 180 days    HbA1c, POC (controlled diabetic range)  Date Value Ref Range Status  04/09/2023 7.7 (A) 0.0 - 7.0 % Final         Passed - Valid encounter within last 6 months    Recent Outpatient Visits           5 months ago DM (diabetes mellitus), type 2 with neurological complications [facility])   [place] [facility] Storm Frisk, MD   9 months ago Controlled type 2 diabetes mellitus without complication, without long-term current use of insulin [facility])   [place] [facility] Storm Frisk, MD   1 year ago Type 2 diabetes mellitus with hyperosmolarity without coma, with long-term current use of insulin [facility])   [place] [facility] Storm Frisk, MD   1 year ago Type 2 diabetes mellitus with hyperosmolarity without coma, with long-term current use of insulin [facility])   [place] [facility] Storm Frisk, MD   1 year ago Type 2 diabetes mellitus with hyperosmolarity without coma, with long-term current use of insulin [facility])    [facility] Storm Frisk, MD       Future Appointments             In 1 week Storm Frisk, MD [facility]

## 2023-09-18 ENCOUNTER — Encounter: Payer: Self-pay | Admitting: Critical Care Medicine

## 2023-09-18 ENCOUNTER — Ambulatory Visit: Payer: MEDICAID | Attending: Critical Care Medicine | Admitting: Critical Care Medicine

## 2023-09-18 VITALS — BP 138/70 | HR 79 | Wt 202.4 lb

## 2023-09-18 DIAGNOSIS — Z23 Encounter for immunization: Secondary | ICD-10-CM

## 2023-09-18 DIAGNOSIS — E119 Type 2 diabetes mellitus without complications: Secondary | ICD-10-CM | POA: Diagnosis not present

## 2023-09-18 DIAGNOSIS — B351 Tinea unguium: Secondary | ICD-10-CM

## 2023-09-18 DIAGNOSIS — I10 Essential (primary) hypertension: Secondary | ICD-10-CM

## 2023-09-18 DIAGNOSIS — K029 Dental caries, unspecified: Secondary | ICD-10-CM

## 2023-09-18 DIAGNOSIS — E1149 Type 2 diabetes mellitus with other diabetic neurological complication: Secondary | ICD-10-CM

## 2023-09-18 DIAGNOSIS — Z7984 Long term (current) use of oral hypoglycemic drugs: Secondary | ICD-10-CM

## 2023-09-18 DIAGNOSIS — S025XXA Fracture of tooth (traumatic), initial encounter for closed fracture: Secondary | ICD-10-CM

## 2023-09-18 DIAGNOSIS — E1165 Type 2 diabetes mellitus with hyperglycemia: Secondary | ICD-10-CM | POA: Diagnosis not present

## 2023-09-18 DIAGNOSIS — Z794 Long term (current) use of insulin: Secondary | ICD-10-CM

## 2023-09-18 DIAGNOSIS — R296 Repeated falls: Secondary | ICD-10-CM

## 2023-09-18 LAB — POCT GLYCOSYLATED HEMOGLOBIN (HGB A1C): HbA1c, POC (controlled diabetic range): 6.4 % (ref 0.0–7.0)

## 2023-09-18 LAB — GLUCOSE, POCT (MANUAL RESULT ENTRY): POC Glucose: 74 mg/dl (ref 70–99)

## 2023-09-18 MED ORDER — ACCU-CHEK GUIDE ME W/DEVICE KIT: PACK | 0 refills | Status: DC

## 2023-09-18 MED ORDER — METFORMIN HCL 1000 MG PO TABS: 1000.0000 mg | ORAL_TABLET | Freq: Every day | ORAL | 2 refills | Status: DC

## 2023-09-18 MED ORDER — CARVEDILOL 25 MG PO TABS: 25.0000 mg | ORAL_TABLET | Freq: Two times a day (BID) | ORAL | 4 refills | Status: DC

## 2023-09-18 MED ORDER — LANTUS SOLOSTAR 100 UNIT/ML ~~LOC~~ SOPN: 30.0000 [IU] | PEN_INJECTOR | Freq: Every day | SUBCUTANEOUS | 2 refills | Status: DC

## 2023-09-18 MED ORDER — DULOXETINE HCL 60 MG PO CPEP: 60.0000 mg | ORAL_CAPSULE | Freq: Every day | ORAL | 3 refills | Status: DC

## 2023-09-18 MED ORDER — ACCU-CHEK SOFTCLIX LANCETS MISC: 12 refills | Status: DC

## 2023-09-18 MED ORDER — PRAZOSIN HCL 2 MG PO CAPS: 2.0000 mg | ORAL_CAPSULE | Freq: Every day | ORAL | 2 refills | Status: DC

## 2023-09-18 MED ORDER — ATORVASTATIN CALCIUM 20 MG PO TABS: 20.0000 mg | ORAL_TABLET | Freq: Every day | ORAL | 3 refills | Status: DC

## 2023-09-18 MED ORDER — AMLODIPINE BESYLATE 10 MG PO TABS: 10.0000 mg | ORAL_TABLET | Freq: Every day | ORAL | 3 refills | Status: DC

## 2023-09-18 MED ORDER — BLOOD PRESSURE KIT DEVI: 0 refills | Status: AC

## 2023-09-18 MED ORDER — ACCU-CHEK GUIDE VI STRP: ORAL_STRIP | 12 refills | Status: DC

## 2023-09-18 MED ORDER — ALBUTEROL SULFATE HFA 108 (90 BASE) MCG/ACT IN AERS: INHALATION_SPRAY | RESPIRATORY_TRACT | 0 refills | Status: DC

## 2023-09-18 MED ORDER — OLANZAPINE 15 MG PO TABS: 15.0000 mg | ORAL_TABLET | Freq: Every day | ORAL | 4 refills | Status: DC

## 2023-09-18 MED ORDER — SILDENAFIL CITRATE 50 MG PO TABS: ORAL_TABLET | ORAL | 1 refills | Status: DC

## 2023-09-18 MED ORDER — TRULICITY 1.5 MG/0.5ML ~~LOC~~ SOAJ: 1.5000 mg | SUBCUTANEOUS | 3 refills | Status: DC

## 2023-09-18 MED ORDER — VALSARTAN 320 MG PO TABS: 320.0000 mg | ORAL_TABLET | Freq: Every day | ORAL | 2 refills | Status: DC

## 2023-09-18 MED ORDER — BD PEN NEEDLE ORIGINAL U/F 29G X 12.7MM MISC: 0 refills | Status: DC

## 2023-09-18 NOTE — Assessment & Plan Note (Signed)
Blood pressure stable will refill carvedilol and valsartan check labs

## 2023-09-18 NOTE — Assessment & Plan Note (Signed)
Improved control of type 2 diabetes continue with insulin and metformin check labs

## 2023-09-18 NOTE — Assessment & Plan Note (Signed)
Referral to podiatry

## 2023-09-18 NOTE — Progress Notes (Signed)
Established Patient Office Visit  Subjective:  Patient ID: [name], male    DOB: 12-04-1961  Age: 62 y.o. MRN: [id number]  CC:  Chief Complaint  Patient presents with   Medical Management of Chronic Issues    Patient stated that when she sleeps he always wakes up in the floor     HPI 01/2022 [name] presents for diabetes follow up. He stated that he was feeling weak, and when the CMA took his blood sugar, it was 44 mg/dL. He was given a glucose tablet, orange juice, and goldfish crackers in an attempt to raise his blood glucose. 45 minutes later, his blood sugar had increased to 162 mg/dL. Prior to coming to the office today, he had eaten two eggs around 4 AM.   The patient states that he does not take his blood sugars at home. If he feels weakness like he did upon presentation to the office, he will eat a snack and tends to feel better afterwards. He has made concerted efforts to improve his diet, and eat healthier foods including fruits and vegetables. He reports that he takes his diabetes medications as prescribed, including weekly Trulicity, metformin 1000 mg twice daily, and Lantus insulin, 50 units at bedtime. He reports no other diabetic symptoms, except for his continued neuropathy in the hands and feet.  The patient also has a history of hypertension. He is currently taking amlodipine 10 mg daily, carvedilol 25mg  twice daily, and valsartan-HCTZ 320-25 daily. His blood pressure was excellent upon measurement in office today, at 114/78 mmHg. He is pleased with this result, as he has been making concerted efforts at lifestyle management.  He has a concern that the Cialis he has been taking for erectile dysfunction is not working. He would like to try a different medication. His other concern is regarding his vision because he is not seeing as well as he used to. He has not been to an ophthalmologist in over a year, and would like an appointment to see one.  He  continues to smoke, but has decreased his usage to about 6 cigarettes per day, a decrease from about 1 PPD. He says that he tends to smoke when he is reading his Bible or writing.  The patient is due for colon cancer screening. An FOBT kit was ordered at his last visit, but he ended up being unable to pick it up. He plans on picking the kit up today after his appointment.  7//11 Patient is seen in return visit and doing well overall arrival blood pressure 139/84 blood sugar 84 A1c though is up to 7.3.  Patient is still smoking about 6 cigarettes daily.  He is trying to be healthier diet.  He has not been able to reduce his tobacco intake however.  Patient states he has not been successful with his erectile dysfunction on the lower dose of Viagra would like a higher dose.  12/12 Patient seen in return follow-up on arrival blood sugar is 122 blood pressure 128/78.  Patient is smoking some black and milds but off other cigarettes.  He agrees to and received the flu vaccine.  He would like a dental referral he has Medicaid.  He is accompanied by his [facility] community support person [place].  He is followed by the community ACT team.  He now has housing.  He is doing much better overall.  Eating more healthy.  Patient does need colon cancer screening urine albumin and metabolic panel and flu shot.  Patient  has some dental problems would like a dental referral  04/09/23 Patient seen in return follow-up as type 2 diabetes A1c on arrival 7.7 blood pressure is 135/75.  Patient does need colon cancer screening.  He would benefit from a home blood pressure monitor.  He is no longer smoking I congratulated him on this.  He does need follow-up dental exam because of severe dental caries.  There are no other complaints.  He does need a foot exam.  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  The  patient is concerned that this is exacerbated his mentation.  Recently his legs gave way and he has increasing numbness in the lower extremities from his neuropathy.  On arrival blood pressure is 138/70 A1c 6.4.  He is compliant with all medications.  He needs multiple refills.  He was in the emergency room for quick case of shingles left chest he was given a course of valacyclovir he still having some itching in this area.  He would like eyeglasses and an eye referral he is doing eye exam for his diabetes.  Glucose today is good.  He needs follow-up podiatry care.  He would benefit from physical therapy with his fall.  He has a dental fracture needs to see a dentist.  Past Medical History:  Diagnosis Date   COVID-19 virus infection 01/20/2020   Dental abscess 10/11/2020   Diabetes mellitus    Diabetic neuropathy (HCC)    High cholesterol    Hypertension    Schizophrenia (HCC)    Toenail fungus 08/17/2019    Past Surgical History:  Procedure Laterality Date   TONSILLECTOMY      Family History  Problem Relation Age of Onset   Hypertension Mother    Cancer Father    Alcoholism Other     Social History   Socioeconomic History   Marital status: Single    Spouse name: Not on file   Number of children: Not on file   Years of education: Not on file   Highest education level: Not on file  Occupational History   Not on file  Tobacco Use   Smoking status: Former    Current packs/day: 0.00    Types: Cigarettes    Quit date: 01/02/2023    Years since quitting: 0.7   Smokeless tobacco: Never  Vaping Use   Vaping status: Never Used  Substance and Sexual Activity   Alcohol use: No   Drug use: No   Sexual activity: Yes  Other Topics Concern   Not on file  Social History Narrative   Not on file   Social Determinants of Health   Financial Resource Strain: Not on file  Food Insecurity: Not on file  Transportation Needs: Not on file  Physical Activity: Not on file  Stress: Not on  file  Social Connections: Not on file  Intimate Partner Violence: Not on file    Outpatient Medications Prior to Visit  Medication Sig Dispense Refill   ASPIRIN LOW DOSE 81 MG tablet TAKE ONE TABLET BY MOUTH DAILY 100 tablet 0   docusate sodium (COLACE) 100 MG capsule Take 1 capsule (100 mg total) by mouth 2 (two) times daily as needed for mild constipation (While taking pain medicine). 10 capsule 0   nicotine polacrilex (NICORETTE MINI) 4 MG lozenge Use three times a day to quit smoking 100 tablet 4  Accu-Chek Softclix Lancets lancets Use as instructed 100 each 12   albuterol (VENTOLIN HFA) 108 (90 Base) MCG/ACT inhaler INHALE TWO PUFFS BY MOUTH INTO THE LUNGS EVERY 4 HOURS AS NEEDED FOR WHEEZING OR SHORTNESS OF BREATH 18 g 0   amLODipine (NORVASC) 10 MG tablet Take 1 tablet (10 mg total) by mouth daily. 90 tablet 3   atorvastatin (LIPITOR) 20 MG tablet Take 1 tablet (20 mg total) by mouth daily. Reported on 05/01/2016 90 tablet 3   Blood Glucose Monitoring Suppl (ACCU-CHEK GUIDE ME) w/Device KIT Check blood sugar twice daily 1 kit 0   Blood Pressure Monitoring (BLOOD PRESSURE KIT) DEVI Use to measure blood pressure 1 each 0   carvedilol (COREG) 25 MG tablet Take 1 tablet (25 mg total) by mouth 2 (two) times daily with a meal. 120 tablet 4   DULoxetine (CYMBALTA) 60 MG capsule Take 1 capsule (60 mg total) by mouth daily. 90 capsule 3   glucose blood (ACCU-CHEK GUIDE) test strip Use as instructed 100 each 12   Insulin Pen Needle (BD ULTRA-FINE PEN NEEDLES) 29G X 12.7MM MISC USE WITH LANTUS PEN 100 each 0   LANTUS SOLOSTAR 100 UNIT/ML Solostar Pen INJECT 30 UNITS INTO THE SKIN AT BEDTIME 15 mL 0   metFORMIN (GLUCOPHAGE) 1000 MG tablet Take 1 tablet (1,000 mg total) by mouth daily with breakfast. 90 tablet 2   OLANZapine (ZYPREXA) 15 MG tablet Take 1 tablet (15 mg total) by mouth at bedtime. 30 tablet 4   oxyCODONE-acetaminophen (PERCOCET/ROXICET) 5-325 MG tablet Take 1 tablet by mouth every 6  (six) hours as needed for severe pain. 6 tablet 0   prazosin (MINIPRESS) 2 MG capsule Take 1 capsule (2 mg total) by mouth daily. 60 capsule 2   sildenafil (VIAGRA) 50 MG tablet TAKE TWO TABLETS BY MOUTH DAILY AS NEEDED FOR ERECTILE DYSFUNCTION 10 tablet 1   TRULICITY 1.5 MG/0.5ML SOPN INJECT 1.5 MG INTO THE SKIN ONCE A WEEK 2 mL 3   valsartan (DIOVAN) 320 MG tablet Take 1 tablet (320 mg total) by mouth daily. 90 tablet 2   Facility-Administered Medications Prior to Visit  Medication Dose Route Frequency Provider Last Rate Last Admin   glucose-Vitamin C 4-0.006 GM per chewable tablet 1 tablet  1 tablet Oral Once Storm Frisk, MD        Allergies  Allergen Reactions   Benadryl [Diphenhydramine Hcl]    Diphenhydramine     GI upset, paradoxic agitation   Haldol [Haloperidol Lactate]    Haldol [Haloperidol]     "makes me tremble, bite my tongue"   Lisinopril Swelling   Thorazine [Chlorpromazine]     Tremble, bite my tongue   Thorazine [Chlorpromazine]     ROS Review of Systems  Constitutional: Negative.  Negative for fatigue.  HENT:  Positive for dental problem. Negative for ear pain, postnasal drip, rhinorrhea, sinus pressure, sore throat, trouble swallowing and voice change.   Eyes: Negative.  Negative for visual disturbance.  Respiratory: Negative.  Negative for apnea, cough, choking, chest tightness, shortness of breath, wheezing and stridor.   Cardiovascular: Negative.  Negative for chest pain, palpitations and leg swelling.  Gastrointestinal: Negative.  Negative for abdominal distention, abdominal pain, constipation, diarrhea, nausea and vomiting.  Genitourinary: Negative.  Negative for dysuria and frequency.       Erectile dysfunction  Musculoskeletal: Negative.  Negative for arthralgias and myalgias.  Skin: Negative.  Negative for rash.  Allergic/Immunologic: Negative.  Negative for environmental allergies and food allergies.  Neurological:  Positive for weakness and  numbness. Negative for dizziness, syncope and headaches.  Hematological: Negative.  Negative for adenopathy. Does not bruise/bleed easily.  Psychiatric/Behavioral: Negative.  Negative for agitation, dysphoric mood and sleep disturbance. The patient is not nervous/anxious.       Objective:    Physical Exam Vitals reviewed.  Constitutional:      Appearance: Normal appearance. He is well-developed and normal weight. He is not diaphoretic.  HENT:     Head: Normocephalic and atraumatic.     Right Ear: External ear normal.     Left Ear: External ear normal.     Nose: No nasal deformity, septal deviation, mucosal edema or rhinorrhea.     Right Sinus: No maxillary sinus tenderness or frontal sinus tenderness.     Left Sinus: No maxillary sinus tenderness or frontal sinus tenderness.     Mouth/Throat:     Mouth: Mucous membranes are moist.     Pharynx: Oropharynx is clear. No oropharyngeal exudate.     Comments: Poor dentition fractured teeth Eyes:     General: No scleral icterus.    Conjunctiva/sclera: Conjunctivae normal.     Pupils: Pupils are equal, round, and reactive to light.  Neck:     Thyroid: No thyromegaly.     Vascular: No carotid bruit or JVD.     Trachea: Trachea normal. No tracheal tenderness or tracheal deviation.  Cardiovascular:     Rate and Rhythm: Normal rate and regular rhythm.     Chest Wall: PMI is not displaced.     Pulses: Normal pulses. No decreased pulses.     Heart sounds: Normal heart sounds, S1 normal and S2 normal. Heart sounds not distant. No murmur heard.    No systolic murmur is present.     No diastolic murmur is present.     No friction rub. No gallop. No S3 or S4 sounds.  Pulmonary:     Effort: Pulmonary effort is normal. No tachypnea, accessory muscle usage or respiratory distress.     Breath sounds: Normal breath sounds. No stridor. No decreased breath sounds, wheezing, rhonchi or rales.  Chest:     Chest wall: No tenderness.  Abdominal:      General: Bowel sounds are normal. There is no distension.     Palpations: Abdomen is soft. Abdomen is not rigid.     Tenderness: There is no abdominal tenderness. There is no guarding or rebound.  Musculoskeletal:        General: Normal range of motion.     Cervical back: Normal range of motion and neck supple. No edema, erythema or rigidity. No muscular tenderness. Normal range of motion.     Comments: Severe onychomycosis foot callus Numbness in both feet bilaterally strength is good in legs  Lymphadenopathy:     Head:     Right side of head: No submental or submandibular adenopathy.     Left side of head: No submental or submandibular adenopathy.     Cervical: No cervical adenopathy.  Skin:    General: Skin is warm and dry.     Coloration: Skin is not pale.     Findings: No rash.     Nails: There is no clubbing.  Neurological:     General: No focal deficit present.     Mental Status: He is alert and oriented to person, place, and time.     Sensory: No sensory deficit.     Comments: Get up and go test normal  Psychiatric:        Attention and Perception: Attention normal.        Mood and Affect: Mood normal.        Speech: Speech normal.        Behavior: Behavior normal.     BP 138/70   Pulse 79   Wt 202 lb 6.4 oz (91.8 kg)   SpO2 95%   BMI 27.45 kg/m  Wt Readings from Last 3 Encounters:  09/18/23 202 lb 6.4 oz (91.8 kg)  04/30/23 209 lb (94.8 kg)  04/09/23 212 lb 9.6 oz (96.4 kg)     Health Maintenance Due  Topic Date Due   COLON CANCER SCREENING ANNUAL FOBT  Never done   Zoster Vaccines- Shingrix (1 of 2) Never done   OPHTHALMOLOGY EXAM  04/24/2023    There are no preventive care reminders to display for this patient.  Lab Results  Component Value Date   TSH 0.992 10/31/2016   Lab Results  Component Value Date   WBC 10.7 12/04/2022   HGB 16.4 12/04/2022   HCT 49.4 12/04/2022   MCV 92 12/04/2022   PLT 151 12/04/2022   Lab Results  Component Value  Date   NA 141 12/04/2022   K 4.5 12/04/2022   CO2 26 12/04/2022   GLUCOSE 98 12/04/2022   BUN 16 12/04/2022   CREATININE 1.30 (H) 12/04/2022   BILITOT 0.3 11/29/2021   ALKPHOS 68 11/29/2021   AST 23 11/29/2021   ALT 19 11/29/2021   PROT 7.3 11/29/2021   ALBUMIN 4.2 12/04/2022   CALCIUM 9.3 12/04/2022   ANIONGAP 8 05/31/2017   EGFR 63 12/04/2022   Lab Results  Component Value Date   CHOL 94 (L) 12/04/2022   Lab Results  Component Value Date   HDL 39 (L) 12/04/2022   Lab Results  Component Value Date   LDLCALC 39 12/04/2022   Lab Results  Component Value Date   TRIG 76 12/04/2022   Lab Results  Component Value Date   CHOLHDL 2.4 12/04/2022   Lab Results  Component Value Date   HGBA1C 6.4 09/18/2023      Assessment & Plan:   Problem List Items Addressed This Visit       Cardiovascular and Mediastinum   Essential hypertension (Chronic)    Blood pressure stable will refill carvedilol and valsartan check labs      Relevant Medications   prazosin (MINIPRESS) 2 MG capsule   amLODipine (NORVASC) 10 MG tablet   atorvastatin (LIPITOR) 20 MG tablet   carvedilol (COREG) 25 MG tablet   valsartan (DIOVAN) 320 MG tablet   sildenafil (VIAGRA) 50 MG tablet     Digestive   Dental caries    Referral to dentistry made      Relevant Orders   Comprehensive metabolic panel     Endocrine   DM (diabetes mellitus), type 2 with neurological complications (HCC)    Continue current program check labs      Relevant Medications   Dulaglutide (TRULICITY) 1.5 MG/0.5ML SOPN   insulin glargine (LANTUS SOLOSTAR) 100 UNIT/ML Solostar Pen   metFORMIN (GLUCOPHAGE) 1000 MG tablet   atorvastatin (LIPITOR) 20 MG tablet   valsartan (DIOVAN) 320 MG tablet   Controlled type 2 diabetes mellitus (HCC) - Primary    Improved control of type 2 diabetes continue with insulin and metformin check labs      Relevant Medications   Dulaglutide (TRULICITY) 1.5 MG/0.5ML SOPN   insulin  glargine (LANTUS SOLOSTAR) 100  UNIT/ML Solostar Pen   metFORMIN (GLUCOPHAGE) 1000 MG tablet   atorvastatin (LIPITOR) 20 MG tablet   valsartan (DIOVAN) 320 MG tablet   Other Relevant Orders   POCT glucose (manual entry) (Completed)   POCT glycosylated hemoglobin (Hb A1C) (Completed)   Lipid panel     Musculoskeletal and Integument   Onychomycosis    Referral to podiatry      Relevant Orders   Ambulatory referral to Dentistry   Ambulatory referral to Ophthalmology   Ambulatory referral to Physical Therapy   Ambulatory referral to Podiatry   Other Visit Diagnoses     Frequent falls       Relevant Orders   Ambulatory referral to Physical Therapy   Closed fracture of tooth, initial encounter       Relevant Orders   Ambulatory referral to Dentistry   Need for immunization against influenza       Relevant Orders   Flu vaccine trivalent PF, 6mos and older(Flulaval,Afluria,Fluarix,Fluzone) (Completed)   Encounter for long-term (current) use of insulin (HCC)       Diabetes mellitus treated with insulin and oral medication (HCC)       Relevant Medications   Dulaglutide (TRULICITY) 1.5 MG/0.5ML SOPN   insulin glargine (LANTUS SOLOSTAR) 100 UNIT/ML Solostar Pen   metFORMIN (GLUCOPHAGE) 1000 MG tablet   atorvastatin (LIPITOR) 20 MG tablet   valsartan (DIOVAN) 320 MG tablet   Diabetes mellitus treated with oral medication (HCC)       Relevant Medications   Dulaglutide (TRULICITY) 1.5 MG/0.5ML SOPN   insulin glargine (LANTUS SOLOSTAR) 100 UNIT/ML Solostar Pen   metFORMIN (GLUCOPHAGE) 1000 MG tablet   atorvastatin (LIPITOR) 20 MG tablet   valsartan (DIOVAN) 320 MG tablet       Meds ordered this encounter  Medications   Blood Glucose Monitoring Suppl (ACCU-CHEK GUIDE ME) w/Device KIT    Sig: Check blood sugar twice daily    Dispense:  1 kit    Refill:  0   albuterol (VENTOLIN HFA) 108 (90 Base) MCG/ACT inhaler    Sig: INHALE TWO PUFFS BY MOUTH INTO THE LUNGS EVERY 4 HOURS AS  NEEDED FOR WHEEZING OR SHORTNESS OF BREATH    Dispense:  18 g    Refill:  0   Dulaglutide (TRULICITY) 1.5 MG/0.5ML SOPN    Sig: Inject 1.5 mg into the skin once a week.    Dispense:  2 mL    Refill:  3   insulin glargine (LANTUS SOLOSTAR) 100 UNIT/ML Solostar Pen    Sig: Inject 30 Units into the skin at bedtime.    Dispense:  15 mL    Refill:  2   prazosin (MINIPRESS) 2 MG capsule    Sig: Take 1 capsule (2 mg total) by mouth daily.    Dispense:  60 capsule    Refill:  2   DULoxetine (CYMBALTA) 60 MG capsule    Sig: Take 1 capsule (60 mg total) by mouth daily.    Dispense:  90 capsule    Refill:  3   metFORMIN (GLUCOPHAGE) 1000 MG tablet    Sig: Take 1 tablet (1,000 mg total) by mouth daily with breakfast.    Dispense:  90 tablet    Refill:  2   amLODipine (NORVASC) 10 MG tablet    Sig: Take 1 tablet (10 mg total) by mouth daily.    Dispense:  90 tablet    Refill:  3   OLANZapine (ZYPREXA) 15 MG tablet    Sig:  Take 1 tablet (15 mg total) by mouth at bedtime.    Dispense:  30 tablet    Refill:  4   atorvastatin (LIPITOR) 20 MG tablet    Sig: Take 1 tablet (20 mg total) by mouth daily. Reported on 05/01/2016    Dispense:  90 tablet    Refill:  3   carvedilol (COREG) 25 MG tablet    Sig: Take 1 tablet (25 mg total) by mouth 2 (two) times daily with a meal.    Dispense:  120 tablet    Refill:  4   valsartan (DIOVAN) 320 MG tablet    Sig: Take 1 tablet (320 mg total) by mouth daily.    Dispense:  90 tablet    Refill:  2   sildenafil (VIAGRA) 50 MG tablet    Sig: TAKE TWO TABLETS BY MOUTH DAILY AS NEEDED FOR ERECTILE DYSFUNCTION    Dispense:  10 tablet    Refill:  1   Accu-Chek Softclix Lancets lancets    Sig: Use as instructed    Dispense:  100 each    Refill:  12   glucose blood (ACCU-CHEK GUIDE) test strip    Sig: Use as instructed    Dispense:  100 each    Refill:  12   Blood Pressure Monitoring (BLOOD PRESSURE KIT) DEVI    Sig: Use to measure blood pressure     Dispense:  1 each    Refill:  0    Mail to pt home   Insulin Pen Needle (BD ULTRA-FINE PEN NEEDLES) 29G X 12.7MM MISC    Sig: USE WITH LANTUS PEN    Dispense:  100 each    Refill:  0  30 minutes needed multiple systems assessed  Follow-up: Return in about 4 months (around 01/18/2024) for htn, diabetes.    Shan Levans, MD

## 2023-09-18 NOTE — Assessment & Plan Note (Signed)
Referral to dentistry made

## 2023-09-18 NOTE — Patient Instructions (Signed)
All medications refilled  Labs today Referrals made No medication changes Return 4 months

## 2023-09-18 NOTE — Assessment & Plan Note (Signed)
Continue current program check labs

## 2023-09-19 ENCOUNTER — Telehealth: Payer: Self-pay

## 2023-09-19 LAB — COMPREHENSIVE METABOLIC PANEL
ALT: 16 IU/L (ref 0–44)
AST: 17 IU/L (ref 0–40)
Albumin: 4.1 g/dL (ref 3.9–4.9)
Alkaline Phosphatase: 69 IU/L (ref 44–121)
BUN/Creatinine Ratio: 11 (ref 10–24)
BUN: 13 mg/dL (ref 8–27)
Bilirubin Total: 0.4 mg/dL (ref 0.0–1.2)
CO2: 23 mmol/L (ref 20–29)
Calcium: 8.9 mg/dL (ref 8.6–10.2)
Chloride: 104 mmol/L (ref 96–106)
Creatinine, Ser: 1.2 mg/dL (ref 0.76–1.27)
Globulin, Total: 2.8 g/dL (ref 1.5–4.5)
Glucose: 53 mg/dL — ABNORMAL LOW (ref 70–99)
Potassium: 4.8 mmol/L (ref 3.5–5.2)
Sodium: 138 mmol/L (ref 134–144)
Total Protein: 6.9 g/dL (ref 6.0–8.5)
eGFR: 68 mL/min/{1.73_m2} (ref >59)

## 2023-09-19 LAB — LIPID PANEL
Chol/HDL Ratio: 2.2 ratio (ref 0.0–5.0)
Cholesterol, Total: 93 mg/dL — ABNORMAL LOW (ref 100–199)
HDL: 42 mg/dL (ref >39)
LDL Chol Calc (NIH): 37 mg/dL (ref 0–99)
Triglycerides: 58 mg/dL (ref 0–149)
VLDL Cholesterol Cal: 14 mg/dL (ref 5–40)

## 2023-09-19 NOTE — Telephone Encounter (Signed)
Pt was called and is aware of results, DOB was confirmed.  ?

## 2023-09-19 NOTE — Progress Notes (Signed)
Let patient know cholesterol is normal liver and kidney normal

## 2023-09-19 NOTE — Telephone Encounter (Signed)
-----   Message from Shan Levans sent at 09/19/2023  8:31 AM EDT ----- Let patient know cholesterol is normal liver and kidney normal

## 2023-09-27 ENCOUNTER — Ambulatory Visit: Payer: MEDICAID | Admitting: Podiatry

## 2023-10-01 ENCOUNTER — Other Ambulatory Visit: Payer: Self-pay

## 2023-10-01 ENCOUNTER — Telehealth: Payer: Self-pay

## 2023-10-01 NOTE — Telephone Encounter (Signed)
Pharmacy Patient Advocate Encounter  Received notification from [facility] that Prior Authorization for TRULICITY has been APPROVED from 10/01/2023 to 09/30/2024   PA #/Case ID/Reference #: [id number]

## 2023-10-03 ENCOUNTER — Ambulatory Visit: Payer: MEDICAID | Admitting: Podiatry

## 2023-10-03 ENCOUNTER — Encounter: Payer: Self-pay | Admitting: Podiatry

## 2023-10-03 DIAGNOSIS — M79675 Pain in left toe(s): Secondary | ICD-10-CM | POA: Diagnosis not present

## 2023-10-03 DIAGNOSIS — B351 Tinea unguium: Secondary | ICD-10-CM | POA: Diagnosis not present

## 2023-10-03 DIAGNOSIS — M79674 Pain in right toe(s): Secondary | ICD-10-CM

## 2023-10-04 NOTE — Progress Notes (Signed)
Subjective:   Patient ID: [name], male   DOB: 62 y.o.   MRN: [id number]   HPI Patient presents with elongated thickened nailbeds 1-5 both feet painful when pressed   ROS      Objective:  Physical Exam  Thick yellow brittle nailbeds 1-5 both feet with inability for him to cut and they are painful      Assessment:  Chronic mycotic nail infection 1-5 both feet     Plan:  Debridement nailbeds 1-5 both feet Neutra genic bleeding reappoint routine care

## 2023-10-08 ENCOUNTER — Other Ambulatory Visit: Payer: Self-pay | Admitting: Critical Care Medicine

## 2023-10-21 ENCOUNTER — Other Ambulatory Visit: Payer: Self-pay | Admitting: Critical Care Medicine

## 2023-10-22 NOTE — Telephone Encounter (Signed)
Requested Prescriptions  Refused Prescriptions Disp Refills   LANTUS SOLOSTAR 100 UNIT/ML Solostar Pen [Pharmacy Med Name: LANTUS SOLOSTAR 100U/ML INJ] 15 mL 0    Sig: INJECT 30 UNITS INTO THE SKIN AT BEDTIME     Endocrinology:  Diabetes - Insulins Passed - 10/21/2023  2:59 PM      Passed - HBA1C is between 0 and 7.9 and within 180 days    HbA1c, POC (controlled diabetic range)  Date Value Ref Range Status  09/18/2023 6.4 0.0 - 7.0 % Final         Passed - Valid encounter within last 6 months    Recent Outpatient Visits           1 month ago Controlled type 2 diabetes mellitus without complication, without long-term current use of insulin (HCC)   [place] [facility] Storm Frisk, MD   6 months ago DM (diabetes mellitus), type 2 with neurological complications [facility])   [place] [facility] [place] [facility] Storm Frisk, MD   10 months ago Controlled type 2 diabetes mellitus without complication, without long-term current use of insulin [facility])   [place] [facility] Storm Frisk, MD   1 year ago Type 2 diabetes mellitus with hyperosmolarity without coma, with long-term current use of insulin [facility])   [place] [facility] Storm Frisk, MD   1 year ago Type 2 diabetes mellitus with hyperosmolarity without coma, with long-term current use of insulin [facility])   [place] [facility] Storm Frisk, MD

## 2023-10-30 ENCOUNTER — Other Ambulatory Visit: Payer: Self-pay | Admitting: Critical Care Medicine

## 2023-11-04 ENCOUNTER — Other Ambulatory Visit: Payer: Self-pay | Admitting: Critical Care Medicine

## 2023-11-04 DIAGNOSIS — I10 Essential (primary) hypertension: Secondary | ICD-10-CM

## 2023-11-05 NOTE — Telephone Encounter (Signed)
Requested Prescriptions  Pending Prescriptions Disp Refills   ASPIRIN LOW DOSE 81 MG tablet [Pharmacy Med Name: ASPIRIN 81 MG TAB 81MG  TAB] 100 tablet 0    Sig: TAKE ONE TABLET BY MOUTH DAILY     Analgesics:  NSAIDS - aspirin Passed - 11/04/2023 11:14 AM      Passed - Cr in normal range and within 360 days    Creatinine  Date Value Ref Range Status  12/04/2014 1.00 0.60 - 1.30 mg/dL Final   Creatinine, Ser  Date Value Ref Range Status  09/18/2023 1.20 0.76 - 1.27 mg/dL Final         Passed - eGFR is 10 or above and within 360 days    EGFR (African American)  Date Value Ref Range Status  12/04/2014 >60 >61mL/min Final   GFR calc Af Amer  Date Value Ref Range Status  01/17/2021 70 >59 mL/min/1.73 Final    Comment:    **In accordance with recommendations from the NKF-ASN Task force,**   [facility] is in the process of updating its eGFR calculation to the   2021 CKD-EPI creatinine equation that estimates kidney function   without a race variable.    EGFR (Non-African Amer.)  Date Value Ref Range Status  12/04/2014 >60 >43mL/min Final    Comment:    eGFR values <34mL/min/1.73 m2 may be an indication of chronic kidney disease (CKD). Calculated eGFR, using the MRDR Study equation, is useful in  patients with stable renal function. The eGFR calculation will not be reliable in acutely ill patients when serum creatinine is changing rapidly. It is not useful in patients on dialysis. The eGFR calculation may not be applicable to patients at the low and high extremes of body sizes, pregnant women, and vegetarians.    GFR calc non Af Amer  Date Value Ref Range Status  01/17/2021 61 >59 mL/min/1.73 Final   eGFR  Date Value Ref Range Status  09/18/2023 68 >59 mL/min/1.73 Final         Passed - Patient is not pregnant      Passed - Valid encounter within last 12 months    Recent Outpatient Visits           1 month ago Controlled type 2 diabetes mellitus without complication,  without long-term current use of insulin (HCC)   [facility] [place]. [facility] Storm Frisk, MD   7 months ago DM (diabetes mellitus), type 2 with neurological complications [facility])   [place] [facility] Of [place]. [facility] Storm Frisk, MD   11 months ago Controlled type 2 diabetes mellitus without complication, without long-term current use of insulin (HCC)   [place] [facility] - A Dept Of [place]. [facility] Storm Frisk, MD   1 year ago Type 2 diabetes mellitus with hyperosmolarity without coma, with long-term current use of insulin [facility])   [place] [facility] - [facility] Dept Of [place]. [facility] Storm Frisk, MD   1 year ago Type 2 diabetes mellitus with hyperosmolarity without coma, with long-term current use of insulin [facility])   [place] [facility] - [facility] Dept Of [place]. [facility] Storm Frisk, MD

## 2023-12-03 ENCOUNTER — Other Ambulatory Visit: Payer: Self-pay | Admitting: Critical Care Medicine

## 2023-12-03 LAB — HM DIABETES EYE EXAM

## 2023-12-31 ENCOUNTER — Other Ambulatory Visit: Payer: Self-pay | Admitting: Critical Care Medicine

## 2024-01-20 ENCOUNTER — Ambulatory Visit: Payer: MEDICAID | Admitting: Family Medicine

## 2024-01-27 ENCOUNTER — Other Ambulatory Visit: Payer: Self-pay | Admitting: Critical Care Medicine

## 2024-04-09 ENCOUNTER — Other Ambulatory Visit: Payer: Self-pay | Admitting: Critical Care Medicine

## 2024-04-09 DIAGNOSIS — I10 Essential (primary) hypertension: Secondary | ICD-10-CM

## 2024-04-29 ENCOUNTER — Telehealth: Payer: Self-pay

## 2024-04-29 NOTE — Telephone Encounter (Signed)
 Contacted patient to follow up on medication refill request, for Ventolin  HFA- 90 mccg/IN AER. Patient is also a type 2 diabetic and Hypertensive. After speaking with Community clinic case worker. Transportation can be arranged for patient. Will arrange transportation for patient on [phone]

## 2024-07-09 LAB — AMB RESULTS CONSOLE CBG: Glucose: 102

## 2024-07-09 NOTE — Progress Notes (Signed)
 Declined sdoh

## 2024-07-13 ENCOUNTER — Other Ambulatory Visit: Payer: Self-pay | Admitting: Critical Care Medicine

## 2024-08-18 LAB — AMB RESULTS CONSOLE CBG: Glucose: 153

## 2024-09-02 ENCOUNTER — Other Ambulatory Visit: Payer: Self-pay | Admitting: Critical Care Medicine

## 2024-09-03 NOTE — Telephone Encounter (Signed)
 Requested medications are due for refill today.  yes  Requested medications are on the active medications list.  yes  Last refill. 10/30/2023 15mL 1 rf  Future visit scheduled.   np  Notes to clinic.  Pt last seen 09/18/2023    Requested Prescriptions  Pending Prescriptions Disp Refills   LANTUS  SOLOSTAR 100 UNIT/ML Solostar Pen [Pharmacy Med Name: LANTUS  SOLOSTAR 100U/ML INJ] 15 mL 1    Sig: INJECT 30 UNITS INTO THE SKIN AT BEDTIME     Endocrinology:  Diabetes - Insulins Failed - 09/03/2024  1:39 PM      Failed - HBA1C is between 0 and 7.9 and within 180 days    HbA1c, POC (controlled diabetic range)  Date Value Ref Range Status  09/18/2023 6.4 0.0 - 7.0 % Final         Failed - Valid encounter within last 6 months    Recent Outpatient Visits           11 months ago Controlled type 2 diabetes mellitus without complication, without long-term current use of insulin  (HCC)   [place] [facility] Of [place]. [facility] Brien Belvie BRAVO, MD   1 year ago DM (diabetes mellitus), type 2 with neurological complications [facility])   [place] [facility] Of [place]. [facility] Brien Belvie BRAVO, MD   1 year ago Controlled type 2 diabetes mellitus without complication, without long-term current use of insulin  [facility])   [place] [facility] Dept Of [place]. [facility] Brien Belvie BRAVO, MD   2 years ago Type 2 diabetes mellitus with hyperosmolarity without coma, with long-term current use of insulin  [facility])   [place] [facility] A Dept Of [place]. [facility] Brien Belvie BRAVO, MD   2 years ago Type 2 diabetes mellitus with hyperosmolarity without coma, with long-term current use of insulin  [facility])   [place] [facility] Dept Of [place]. [facility] Brien Belvie BRAVO, MD

## 2024-09-08 ENCOUNTER — Other Ambulatory Visit: Payer: Self-pay | Admitting: Critical Care Medicine

## 2024-10-02 NOTE — Progress Notes (Addendum)
 Pt attended 08/18/2024 screening event with BP of 136/86.  Per initial f/u pt was reached out via phone and pt stated that he would like his PCP appt to be scheduled for him. Pt gave CHW consent to schedule upcoming appt with PCP. Appt was scheduled and pt was notified along with care manager who was also present on the phone call (pt gave consent for case manager to appt information). Care manager stated that pt has a peer counselor who assists pt with scheduling appts.  Per chart review pt does have a PCP Dru Silvan; [facility]), insurance, and is a former smoker. Pt's last appt with PCP was 09/18/2023 and has an upcoming appt on 10/05/2024. Pt does not indicate any SDOH needs at this time.  Additional pt f/u to be scheduled at this time per health equity protocol.

## 2024-10-05 ENCOUNTER — Ambulatory Visit: Payer: MEDICAID | Admitting: Nurse Practitioner

## 2024-11-18 ENCOUNTER — Ambulatory Visit: Payer: MEDICAID | Admitting: Nurse Practitioner

## 2024-11-26 ENCOUNTER — Ambulatory Visit: Payer: MEDICAID | Admitting: Critical Care Medicine

## 2024-11-26 ENCOUNTER — Telehealth: Payer: Self-pay | Admitting: Critical Care Medicine

## 2024-11-26 NOTE — Telephone Encounter (Addendum)
 Patient scheduled for today 12/4 at 3:30 PM. Patient did not have transportation, so I offered to arrange transportation for him. Patient was very adult nurse.   Transportation Voucher   Pick up date and time: 11/26/2024 3:00 pm.  Pick up address: [address]  [place] [address]   Drop off address: [address] [address]

## 2024-11-26 NOTE — Telephone Encounter (Signed)
 Copied from CRM [id number]. Topic: Appointments - Scheduling Inquiry for Clinic >> Nov 25, 2024  4:51 PM Jasmin G wrote:  Reason for CRM: Pt's therapist, Ms. Jerel requested a call back at [phone] to schedule an appt needed for Insulin . System would not let me schedule appt.

## 2024-11-26 NOTE — Telephone Encounter (Unsigned)
 Copied from CRM #[id number]. Topic: Appointments - Appointment Scheduling >> Nov 26, 2024  4:58 PM Travis F wrote: Patient's clinician,  Ms. Jerel is calling in because she wanted to reschedule patient's appointment he missed on 11/26/24 with Dr. Brien. System wouldn't let me schedule. Please follow up with Ms. Jerel [phone]

## 2024-11-27 NOTE — Telephone Encounter (Addendum)
 Contacted patient. Patient scheduled for 12/9 at 9:30 AM. Patient acknowledged appointment. Transportation has also been arranged.

## 2024-11-30 ENCOUNTER — Telehealth: Payer: Self-pay | Admitting: Critical Care Medicine

## 2024-11-30 NOTE — Telephone Encounter (Signed)
 Copied from CRM [id number]. Topic: Appointments - Scheduling Inquiry for Clinic >> Nov 30, 2024  1:43 PM Berwyn MATSU wrote:  Reason for CRM: Patient called in to reschedule appointment due to delay for tomorrow. However there is no schedule showing for Dr.Wright and unable to schedule. Patient also had transportation set up.   May you please assist.

## 2024-11-30 NOTE — Telephone Encounter (Signed)
 Contacted patient. Appointment has been successfully rescheduled as requested.

## 2024-11-30 NOTE — Progress Notes (Unsigned)
 Established Patient Office Visit  Subjective:  Patient ID: [name], male    DOB: 05-Jul-1961  Age: 63 y.o. MRN: [id number]  CC:  No chief complaint on file.   HPI 01/2022 [name] presents for diabetes follow up. He stated that he was feeling weak, and when the CMA took his blood sugar, it was 44 mg/dL. He was given a glucose tablet, orange juice, and goldfish crackers in an attempt to raise his blood glucose. 45 minutes later, his blood sugar had increased to 162 mg/dL. Prior to coming to the office today, he had eaten two eggs around 4 AM.   The patient states that he does not take his blood sugars at home. If he feels weakness like he did upon presentation to the office, he will eat a snack and tends to feel better afterwards. He has made concerted efforts to improve his diet, and eat healthier foods including fruits and vegetables. He reports that he takes his diabetes medications as prescribed, including weekly Trulicity , metformin  1000 mg twice daily, and Lantus  insulin , 50 units at bedtime. He reports no other diabetic symptoms, except for his continued neuropathy in the hands and feet.  The patient also has a history of hypertension. He is currently taking amlodipine  10 mg daily, carvedilol  25mg  twice daily, and valsartan -HCTZ 320-25 daily. His blood pressure was excellent upon measurement in office today, at 114/78 mmHg. He is pleased with this result, as he has been making concerted efforts at lifestyle management.  He has a concern that the Cialis  he has been taking for erectile dysfunction is not working. He would like to try a different medication. His other concern is regarding his vision because he is not seeing as well as he used to. He has not been to an ophthalmologist in over a year, and would like an appointment to see one.  He continues to smoke, but has decreased his usage to about 6 cigarettes per day, a decrease from about 1 PPD. He says that he tends to  smoke when he is reading his Bible or writing.  The patient is due for colon cancer screening. An FOBT kit was ordered at his last visit, but he ended up being unable to pick it up. He plans on picking the kit up today after his appointment.  7//11 Patient is seen in return visit and doing well overall arrival blood pressure 139/84 blood sugar 84 A1c though is up to 7.3.  Patient is still smoking about 6 cigarettes daily.  He is trying to be healthier diet.  He has not been able to reduce his tobacco intake however.  Patient states he has not been successful with his erectile dysfunction on the lower dose of Viagra  would like a higher dose.  12/12 Patient seen in return follow-up on arrival blood sugar is 122 blood pressure 128/78.  Patient is smoking some black and milds but off other cigarettes.  He agrees to and received the flu vaccine.  He would like a dental referral he has Medicaid.  He is accompanied by his [facility] community support person [name].  He is followed by the community ACT team.  He now has housing.  He is doing much better overall.  Eating more healthy.  Patient does need colon cancer screening urine albumin and metabolic panel and flu shot.  Patient has some dental problems would like a dental referral  04/09/23 Patient seen in return follow-up as type 2 diabetes A1c on arrival 7.7 blood pressure  is 135/75.  Patient does need colon cancer screening.  He would benefit from a home blood pressure monitor.  He is no longer smoking I congratulated him on this.  He does need follow-up dental exam because of severe dental caries.  There are no other complaints.  He does need a foot exam.  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  The patient is concerned that this is exacerbated his mentation.  Recently his legs gave way and he has increasing numbness in the lower  extremities from his neuropathy.  On arrival blood pressure is 138/70 A1c 6.4.  He is compliant with all medications.  He needs multiple refills.  He was in the emergency room for quick case of shingles left chest he was given a course of valacyclovir  he still having some itching in this area.  He would like eyeglasses and an eye referral he is doing eye exam for his diabetes.  Glucose today is good.  He needs follow-up podiatry care.  He would benefit from physical therapy with his fall.  He has a dental fracture needs to see a dentist.  12/01/24  Past Medical History:  Diagnosis Date   COVID-19 virus infection 01/20/2020   Dental abscess 10/11/2020   Diabetes mellitus    Diabetic neuropathy (HCC)    High cholesterol    Hypertension    Schizophrenia (HCC)    Toenail fungus 08/17/2019    Past Surgical History:  Procedure Laterality Date   TONSILLECTOMY      Family History  Problem Relation Age of Onset   Hypertension Mother    Cancer Father    Alcoholism Other     Social History   Socioeconomic History   Marital status: Single    Spouse name: Not on file   Number of children: Not on file   Years of education: Not on file   Highest education level: Not on file  Occupational History   Not on file  Tobacco Use   Smoking status: Former    Current packs/day: 0.00    Types: Cigarettes    Quit date: 01/02/2023    Years since quitting: 1.9   Smokeless tobacco: Never  Vaping Use   Vaping status: Never Used  Substance and Sexual Activity   Alcohol use: No   Drug use: No   Sexual activity: Yes  Other Topics Concern   Not on file  Social History Narrative   Not on file   Social Drivers of Health   Financial Resource Strain: Not on file  Food Insecurity: Patient Declined (08/18/2024)   Hunger Vital Sign    Worried About Running Out of Food in the Last Year: Patient declined    Ran Out of Food in the Last Year: Patient declined  Transportation Needs: Patient Declined  (07/09/2024)   PRAPARE - Administrator, Civil Service (Medical): Patient declined    Lack of Transportation (Non-Medical): Patient declined  Physical Activity: Not on file  Stress: Not on file  Social Connections: Not on file  Intimate Partner Violence: Patient Declined (08/18/2024)   Humiliation, Afraid, Rape, and Kick questionnaire    Fear of Current or Ex-Partner: Patient declined    Emotionally Abused: Patient declined    Physically Abused: Patient declined    Sexually Abused: Patient declined    Outpatient Medications Prior to Visit  Medication Sig  Dispense Refill   Accu-Chek Softclix Lancets lancets USE AS INSTRUCTED 100 each 0   albuterol  (VENTOLIN  HFA) 108 (90 Base) MCG/ACT inhaler INHALE TWO PUFFS BY MOUTH INTO THE LUNGS EVERY 4 HOURS AS NEEDED FOR WHEEZING OR SHORTNESS OF BREATH 18 g 2   amLODipine  (NORVASC ) 10 MG tablet TAKE ONE TABLET BY MOUTH DAILY 30 tablet 0   aspirin  EC (ASPIRIN  LOW DOSE) 81 MG tablet TAKE ONE TABLET BY MOUTH DAILY 30 tablet 0   atorvastatin  (LIPITOR) 20 MG tablet TAKE ONE TABLET BY MOUTH DAILY 30 tablet 0   Blood Glucose Monitoring Suppl (ACCU-CHEK GUIDE ME) w/Device KIT Check blood sugar twice daily 1 kit 0   Blood Pressure Monitoring (BLOOD PRESSURE KIT) DEVI Use to measure blood pressure 1 each 0   carvedilol  (COREG ) 25 MG tablet TAKE ONE TABLET BY MOUTH TWICE A DAY WITH MEALS 60 tablet 0   docusate sodium  (COLACE) 100 MG capsule Take 1 capsule (100 mg total) by mouth 2 (two) times daily as needed for mild constipation (While taking pain medicine). 10 capsule 0   Dulaglutide  (TRULICITY ) 1.5 MG/0.5ML SOPN Inject 1.5 mg into the skin once a week. 2 mL 3   DULoxetine  (CYMBALTA ) 60 MG capsule Take 1 capsule (60 mg total) by mouth daily. 90 capsule 3   glucose blood (ACCU-CHEK GUIDE TEST) test strip USE AS INSTRUCTED 100 each 0   insulin  glargine (LANTUS  SOLOSTAR) 100 UNIT/ML Solostar Pen INJECT 30 UNITS INTO THE SKIN AT BEDTIME 15 mL 1    Insulin  Pen Needle (BD ULTRA-FINE PEN NEEDLES) 29G X 12.7MM MISC USE WITH LANTUS  PEN 100 each 0   metFORMIN  (GLUCOPHAGE ) 1000 MG tablet TAKE ONE TABLET BY MOUTH DAILY WITH BREAKFAST 30 tablet 0   nicotine  polacrilex (NICORETTE  MINI) 4 MG lozenge Use three times a day to quit smoking 100 tablet 4   OLANZapine  (ZYPREXA ) 15 MG tablet Take 1 tablet (15 mg total) by mouth at bedtime. 30 tablet 4   prazosin  (MINIPRESS ) 2 MG capsule Take 1 capsule (2 mg total) by mouth daily. 60 capsule 2   sildenafil  (VIAGRA ) 50 MG tablet TAKE TWO TABLETS BY MOUTH DAILY AS NEEDED FOR ERECTILE DYSFUNCTION 10 tablet 1   valsartan  (DIOVAN ) 320 MG tablet Take 1 tablet (320 mg total) by mouth daily. 90 tablet 2   Facility-Administered Medications Prior to Visit  Medication Dose Route Frequency Provider Last Rate Last Admin   glucose-Vitamin C  4-0.006 GM per chewable tablet 1 tablet  1 tablet Oral Once Mahalia Dykes E, MD        Allergies  Allergen Reactions   Benadryl  [Diphenhydramine  Hcl]    Diphenhydramine      GI upset, paradoxic agitation   Haldol [Haloperidol Lactate]    Haldol [Haloperidol]     makes me tremble, bite my tongue   Lisinopril Swelling   Thorazine [Chlorpromazine]     Tremble, bite my tongue   Thorazine [Chlorpromazine]     ROS Review of Systems  Constitutional: Negative.  Negative for fatigue.  HENT:  Positive for dental problem. Negative for ear pain, postnasal drip, rhinorrhea, sinus pressure, sore throat, trouble swallowing and voice change.   Eyes: Negative.  Negative for visual disturbance.  Respiratory: Negative.  Negative for apnea, cough, choking, chest tightness, shortness of breath, wheezing and stridor.   Cardiovascular: Negative.  Negative for chest pain, palpitations and leg swelling.  Gastrointestinal: Negative.  Negative for abdominal distention, abdominal pain, constipation, diarrhea, nausea and vomiting.  Genitourinary: Negative.  Negative for  dysuria and frequency.        Erectile dysfunction  Musculoskeletal: Negative.  Negative for arthralgias and myalgias.  Skin: Negative.  Negative for rash.  Allergic/Immunologic: Negative.  Negative for environmental allergies and food allergies.  Neurological:  Positive for weakness and numbness. Negative for dizziness, syncope and headaches.  Hematological: Negative.  Negative for adenopathy. Does not bruise/bleed easily.  Psychiatric/Behavioral: Negative.  Negative for agitation, dysphoric mood and sleep disturbance. The patient is not nervous/anxious.       Objective:    Physical Exam Vitals reviewed.  Constitutional:      Appearance: Normal appearance. He is well-developed and normal weight. He is not diaphoretic.  HENT:     Head: Normocephalic and atraumatic.     Right Ear: External ear normal.     Left Ear: External ear normal.     Nose: No nasal deformity, septal deviation, mucosal edema or rhinorrhea.     Right Sinus: No maxillary sinus tenderness or frontal sinus tenderness.     Left Sinus: No maxillary sinus tenderness or frontal sinus tenderness.     Mouth/Throat:     Mouth: Mucous membranes are moist.     Pharynx: Oropharynx is clear. No oropharyngeal exudate.     Comments: Poor dentition fractured teeth Eyes:     General: No scleral icterus.    Conjunctiva/sclera: Conjunctivae normal.     Pupils: Pupils are equal, round, and reactive to light.  Neck:     Thyroid : No thyromegaly.     Vascular: No carotid bruit or JVD.     Trachea: Trachea normal. No tracheal tenderness or tracheal deviation.  Cardiovascular:     Rate and Rhythm: Normal rate and regular rhythm.     Chest Wall: PMI is not displaced.     Pulses: Normal pulses. No decreased pulses.     Heart sounds: Normal heart sounds, S1 normal and S2 normal. Heart sounds not distant. No murmur heard.    No systolic murmur is present.     No diastolic murmur is present.     No friction rub. No gallop. No S3 or S4 sounds.  Pulmonary:      Effort: Pulmonary effort is normal. No tachypnea, accessory muscle usage or respiratory distress.     Breath sounds: Normal breath sounds. No stridor. No decreased breath sounds, wheezing, rhonchi or rales.  Chest:     Chest wall: No tenderness.  Abdominal:     General: Bowel sounds are normal. There is no distension.     Palpations: Abdomen is soft. Abdomen is not rigid.     Tenderness: There is no abdominal tenderness. There is no guarding or rebound.  Musculoskeletal:        General: Normal range of motion.     Cervical back: Normal range of motion and neck supple. No edema, erythema or rigidity. No muscular tenderness. Normal range of motion.     Comments: Severe onychomycosis foot callus Numbness in both feet bilaterally strength is good in legs  Lymphadenopathy:     Head:     Right side of head: No submental or submandibular adenopathy.     Left side of head: No submental or submandibular adenopathy.     Cervical: No cervical adenopathy.  Skin:    General: Skin is warm and dry.     Coloration: Skin is not pale.     Findings: No rash.     Nails: There is no clubbing.  Neurological:     General: No  focal deficit present.     Mental Status: He is alert and oriented to person, place, and time.     Sensory: No sensory deficit.     Comments: Get up and go test normal  Psychiatric:        Attention and Perception: Attention normal.        Mood and Affect: Mood normal.        Speech: Speech normal.        Behavior: Behavior normal.     There were no vitals taken for this visit. Wt Readings from Last 3 Encounters:  09/18/23 202 lb 6.4 oz (91.8 kg)  04/30/23 209 lb (94.8 kg)  04/09/23 212 lb 9.6 oz (96.4 kg)     Health Maintenance Due  Topic Date Due   COLON CANCER SCREENING ANNUAL FOBT  Never done   Zoster Vaccines- Shingrix (1 of 2) Never done   Pneumococcal Vaccine: 50+ Years (2 of 2 - PCV) 08/16/2020   Diabetic kidney evaluation - Urine ACR  12/05/2023   HEMOGLOBIN  A1C  03/17/2024   FOOT EXAM  04/08/2024   Influenza Vaccine  07/24/2024   Diabetic kidney evaluation - eGFR measurement  09/17/2024    There are no preventive care reminders to display for this patient.  Lab Results  Component Value Date   TSH 0.992 10/31/2016   Lab Results  Component Value Date   WBC 10.7 12/04/2022   HGB 16.4 12/04/2022   HCT 49.4 12/04/2022   MCV 92 12/04/2022   PLT 151 12/04/2022   Lab Results  Component Value Date   NA 138 09/18/2023   K 4.8 09/18/2023   CO2 23 09/18/2023   GLUCOSE 53 (L) 09/18/2023   BUN 13 09/18/2023   CREATININE 1.20 09/18/2023   BILITOT 0.4 09/18/2023   ALKPHOS 69 09/18/2023   AST 17 09/18/2023   ALT 16 09/18/2023   PROT 6.9 09/18/2023   ALBUMIN 4.1 09/18/2023   CALCIUM  8.9 09/18/2023   ANIONGAP 8 05/31/2017   EGFR 68 09/18/2023   Lab Results  Component Value Date   CHOL 93 (L) 09/18/2023   Lab Results  Component Value Date   HDL 42 09/18/2023   Lab Results  Component Value Date   LDLCALC 37 09/18/2023   Lab Results  Component Value Date   TRIG 58 09/18/2023   Lab Results  Component Value Date   CHOLHDL 2.2 09/18/2023   Lab Results  Component Value Date   HGBA1C 6.4 09/18/2023      Assessment & Plan:   Problem List Items Addressed This Visit   None    No orders of the defined types were placed in this encounter. 30 minutes needed multiple systems assessed  Follow-up: No follow-ups on file.    Belvie Silvan, MD

## 2024-12-01 ENCOUNTER — Encounter: Payer: Self-pay | Admitting: Critical Care Medicine

## 2024-12-01 ENCOUNTER — Ambulatory Visit: Payer: MEDICAID | Admitting: Critical Care Medicine

## 2024-12-01 ENCOUNTER — Ambulatory Visit: Payer: MEDICAID | Attending: Critical Care Medicine | Admitting: Critical Care Medicine

## 2024-12-01 VITALS — BP 133/90 | HR 80 | Temp 98.7°F | Ht 72.0 in | Wt 173.0 lb

## 2024-12-01 DIAGNOSIS — Z7984 Long term (current) use of oral hypoglycemic drugs: Secondary | ICD-10-CM

## 2024-12-01 DIAGNOSIS — E119 Type 2 diabetes mellitus without complications: Secondary | ICD-10-CM

## 2024-12-01 DIAGNOSIS — B351 Tinea unguium: Secondary | ICD-10-CM

## 2024-12-01 DIAGNOSIS — I10 Essential (primary) hypertension: Secondary | ICD-10-CM

## 2024-12-01 DIAGNOSIS — E1149 Type 2 diabetes mellitus with other diabetic neurological complication: Secondary | ICD-10-CM

## 2024-12-01 DIAGNOSIS — K029 Dental caries, unspecified: Secondary | ICD-10-CM

## 2024-12-01 DIAGNOSIS — F200 Paranoid schizophrenia: Secondary | ICD-10-CM

## 2024-12-01 DIAGNOSIS — Z794 Long term (current) use of insulin: Secondary | ICD-10-CM

## 2024-12-01 DIAGNOSIS — Z7985 Long-term (current) use of injectable non-insulin antidiabetic drugs: Secondary | ICD-10-CM

## 2024-12-01 DIAGNOSIS — Z1211 Encounter for screening for malignant neoplasm of colon: Secondary | ICD-10-CM

## 2024-12-01 DIAGNOSIS — N5201 Erectile dysfunction due to arterial insufficiency: Secondary | ICD-10-CM

## 2024-12-01 LAB — POCT GLYCOSYLATED HEMOGLOBIN (HGB A1C): HbA1c POC (<> result, manual entry): >15 % (ref 4.0–5.6)

## 2024-12-01 MED ORDER — SILDENAFIL CITRATE 50 MG PO TABS: ORAL_TABLET | ORAL | 3 refills | Status: AC

## 2024-12-01 MED ORDER — ACCU-CHEK GUIDE TEST VI STRP: ORAL_STRIP | 1 refills | Status: AC

## 2024-12-01 MED ORDER — CARVEDILOL 25 MG PO TABS: 25.0000 mg | ORAL_TABLET | Freq: Two times a day (BID) | ORAL | 1 refills | Status: AC

## 2024-12-01 MED ORDER — LANTUS SOLOSTAR 100 UNIT/ML ~~LOC~~ SOPN: 30.0000 [IU] | PEN_INJECTOR | Freq: Every day | SUBCUTANEOUS | 3 refills | Status: AC

## 2024-12-01 MED ORDER — TRULICITY 1.5 MG/0.5ML ~~LOC~~ SOAJ: 1.5000 mg | SUBCUTANEOUS | 3 refills | Status: AC

## 2024-12-01 MED ORDER — ATORVASTATIN CALCIUM 20 MG PO TABS: 20.0000 mg | ORAL_TABLET | Freq: Every day | ORAL | 2 refills | Status: AC

## 2024-12-01 MED ORDER — ACCU-CHEK GUIDE W/DEVICE KIT: PACK | 0 refills | Status: AC

## 2024-12-01 MED ORDER — METFORMIN HCL 1000 MG PO TABS: 1000.0000 mg | ORAL_TABLET | Freq: Two times a day (BID) | ORAL | 3 refills | Status: AC

## 2024-12-01 MED ORDER — EMPAGLIFLOZIN 10 MG PO TABS: 10.0000 mg | ORAL_TABLET | Freq: Every day | ORAL | 3 refills | Status: AC

## 2024-12-01 MED ORDER — ALBUTEROL SULFATE HFA 108 (90 BASE) MCG/ACT IN AERS: INHALATION_SPRAY | RESPIRATORY_TRACT | 2 refills | Status: AC

## 2024-12-01 MED ORDER — ACCU-CHEK SOFTCLIX LANCETS MISC: 1 refills | Status: AC

## 2024-12-01 MED ORDER — METFORMIN HCL 1000 MG PO TABS: 1000.0000 mg | ORAL_TABLET | Freq: Every day | ORAL | 3 refills | Status: DC

## 2024-12-01 MED ORDER — PRAZOSIN HCL 2 MG PO CAPS: 2.0000 mg | ORAL_CAPSULE | Freq: Every day | ORAL | 2 refills | Status: AC

## 2024-12-01 MED ORDER — OLANZAPINE 15 MG PO TABS: 15.0000 mg | ORAL_TABLET | Freq: Every day | ORAL | 4 refills | Status: AC

## 2024-12-01 MED ORDER — ASPIRIN 81 MG PO TBEC: 81.0000 mg | DELAYED_RELEASE_TABLET | Freq: Every day | ORAL | 2 refills | Status: AC

## 2024-12-01 MED ORDER — AMLODIPINE BESYLATE 10 MG PO TABS: 10.0000 mg | ORAL_TABLET | Freq: Every day | ORAL | 1 refills | Status: AC

## 2024-12-01 MED ORDER — VALSARTAN 320 MG PO TABS: 320.0000 mg | ORAL_TABLET | Freq: Every day | ORAL | 2 refills | Status: AC

## 2024-12-01 MED ORDER — DULOXETINE HCL 60 MG PO CPEP: 60.0000 mg | ORAL_CAPSULE | Freq: Every day | ORAL | 3 refills | Status: AC

## 2024-12-01 NOTE — Assessment & Plan Note (Signed)
 Continue follow-up with mental health and renew Zyprexa 

## 2024-12-01 NOTE — Assessment & Plan Note (Signed)
 Blood pressure not well-controlled we will refill current medications make no changes check labs

## 2024-12-01 NOTE — Patient Instructions (Addendum)
 Labs to be obtained today  All medications refilled  Increase metformin  to 1000 mg twice daily and start Jardiance  1 pill daily all other medications for diabetes same  Referrals to foot doctor and dentist made  Return to clinic 2 months

## 2024-12-01 NOTE — Assessment & Plan Note (Signed)
 Add Jardiance  continue insulin  as prescribed continue metformin  but increase to 1000 mg twice daily short-term follow-up check diabetic urine

## 2024-12-01 NOTE — Assessment & Plan Note (Signed)
 Attempt referral to podiatry

## 2024-12-01 NOTE — Assessment & Plan Note (Signed)
Renew Viagra 

## 2024-12-01 NOTE — Assessment & Plan Note (Signed)
Repeat colon cancer screening.

## 2024-12-01 NOTE — Assessment & Plan Note (Signed)
 Patient never went to the dentist will make another referral

## 2024-12-02 ENCOUNTER — Emergency Department (HOSPITAL_COMMUNITY)
Admission: EM | Admit: 2024-12-02 | Discharge: 2024-12-02 | Disposition: A | Discharge status: Home / Self Care | Payer: MEDICAID

## 2024-12-02 ENCOUNTER — Ambulatory Visit: Payer: Self-pay | Admitting: Critical Care Medicine

## 2024-12-02 ENCOUNTER — Telehealth: Payer: Self-pay | Admitting: Critical Care Medicine

## 2024-12-02 ENCOUNTER — Encounter (HOSPITAL_COMMUNITY): Payer: Self-pay

## 2024-12-02 ENCOUNTER — Other Ambulatory Visit: Payer: Self-pay

## 2024-12-02 DIAGNOSIS — R739 Hyperglycemia, unspecified: Secondary | ICD-10-CM | POA: Diagnosis not present

## 2024-12-02 DIAGNOSIS — Z5321 Procedure and treatment not carried out due to patient leaving prior to being seen by health care provider: Secondary | ICD-10-CM | POA: Insufficient documentation

## 2024-12-02 DIAGNOSIS — E860 Dehydration: Secondary | ICD-10-CM | POA: Diagnosis present

## 2024-12-02 LAB — CBC WITH DIFFERENTIAL/PLATELET
Basophils Absolute: 0.1 x10E3/uL (ref 0.0–0.2)
Basos: 1 %
EOS (ABSOLUTE): 0.5 x10E3/uL — ABNORMAL HIGH (ref 0.0–0.4)
Eos: 5 %
Hematocrit: 55 % — ABNORMAL HIGH (ref 37.5–51.0)
Hemoglobin: 17.2 g/dL (ref 13.0–17.7)
Immature Grans (Abs): 0 x10E3/uL (ref 0.0–0.1)
Immature Granulocytes: 0 %
Lymphocytes Absolute: 1.4 x10E3/uL (ref 0.7–3.1)
Lymphs: 14 %
MCH: 29.9 pg (ref 26.6–33.0)
MCHC: 31.3 g/dL — ABNORMAL LOW (ref 31.5–35.7)
MCV: 96 fL (ref 79–97)
Monocytes Absolute: 0.7 x10E3/uL (ref 0.1–0.9)
Monocytes: 7 %
Neutrophils Absolute: 7.1 x10E3/uL — ABNORMAL HIGH (ref 1.4–7.0)
Neutrophils: 73 %
Platelets: 153 x10E3/uL (ref 150–450)
RBC: 5.76 x10E6/uL (ref 4.14–5.80)
RDW: 12 % (ref 11.6–15.4)
WBC: 9.7 x10E3/uL (ref 3.4–10.8)

## 2024-12-02 LAB — I-STAT CHEM 8, ED
BUN: 19 mg/dL (ref 8–23)
Calcium, Ion: 1.17 mmol/L (ref 1.15–1.40)
Chloride: 94 mmol/L — ABNORMAL LOW (ref 98–111)
Creatinine, Ser: 1.1 mg/dL (ref 0.61–1.24)
Glucose, Bld: 586 mg/dL (ref 70–99)
HCT: 50 % (ref 39.0–52.0)
Hemoglobin: 17 g/dL (ref 13.0–17.0)
Potassium: 4.7 mmol/L (ref 3.5–5.1)
Sodium: 131 mmol/L — ABNORMAL LOW (ref 135–145)
TCO2: 26 mmol/L (ref 22–32)

## 2024-12-02 LAB — CMP14+EGFR
ALT: 27 IU/L (ref 0–44)
AST: 19 IU/L (ref 0–40)
Albumin: 4.6 g/dL (ref 3.9–4.9)
Alkaline Phosphatase: 112 IU/L (ref 47–123)
BUN/Creatinine Ratio: 12 (ref 10–24)
BUN: 15 mg/dL (ref 8–27)
Bilirubin Total: 0.5 mg/dL (ref 0.0–1.2)
CO2: 26 mmol/L (ref 20–29)
Calcium: 10.6 mg/dL — ABNORMAL HIGH (ref 8.6–10.2)
Chloride: 91 mmol/L — ABNORMAL LOW (ref 96–106)
Creatinine, Ser: 1.28 mg/dL — ABNORMAL HIGH (ref 0.76–1.27)
Globulin, Total: 3 g/dL (ref 1.5–4.5)
Glucose: 554 mg/dL (ref 70–99)
Potassium: 5.6 mmol/L — ABNORMAL HIGH (ref 3.5–5.2)
Sodium: 131 mmol/L — ABNORMAL LOW (ref 134–144)
Total Protein: 7.6 g/dL (ref 6.0–8.5)
eGFR: 63 mL/min/1.73 (ref >59)

## 2024-12-02 LAB — CBC
HCT: 44.7 % (ref 39.0–52.0)
Hemoglobin: 15.5 g/dL (ref 13.0–17.0)
MCH: 30.6 pg (ref 26.0–34.0)
MCHC: 34.7 g/dL (ref 30.0–36.0)
MCV: 88.2 fL (ref 80.0–100.0)
Platelets: 145 K/uL — ABNORMAL LOW (ref 150–400)
RBC: 5.07 MIL/uL (ref 4.22–5.81)
RDW: 12.4 % (ref 11.5–15.5)
WBC: 9 K/uL (ref 4.0–10.5)
nRBC: 0 % (ref 0.0–0.2)

## 2024-12-02 LAB — CBG MONITORING, ED: Glucose-Capillary: 590 mg/dL (ref 70–99)

## 2024-12-02 LAB — LIPID PANEL
Chol/HDL Ratio: 3.1 ratio (ref 0.0–5.0)
Cholesterol, Total: 144 mg/dL (ref 100–199)
HDL: 47 mg/dL (ref >39)
LDL Chol Calc (NIH): 72 mg/dL (ref 0–99)
Triglycerides: 147 mg/dL (ref 0–149)
VLDL Cholesterol Cal: 25 mg/dL (ref 5–40)

## 2024-12-02 NOTE — Telephone Encounter (Signed)
 Copied from CRM #[id number]. Topic: Clinical - Lab/Test Results >> Dec 02, 2024 10:16 AM Harlene ORN wrote:  Reason for CRM:  [facility] - [facility] critical results for glucose lab. Transferred Rep to the Practice to let the nurse know. Phone for [facility]: [phone], option 2

## 2024-12-02 NOTE — ED Triage Notes (Signed)
 Denies dizziness, CP, SHOB

## 2024-12-02 NOTE — Telephone Encounter (Signed)
 Glucose Level  from 12/01/2024 called to the office by [facility] ([facility]) Level 554.

## 2024-12-02 NOTE — Progress Notes (Signed)
 The patient was contacted and sent to [facility] asap. His case worker is driving. I gave report to the ed triage rn

## 2024-12-02 NOTE — ED Provider Triage Note (Signed)
 Emergency Medicine Provider Triage Evaluation Note  [name] , a 63 y.o. male  was evaluated in triage.  Pt complains of hyperglycemia.  History of diabetes on insulin .  Referred here from PCP after being found to have glucose of greater than 500.  No history of DKA.  Patient is without any complaints.  Review of Systems  Positive:  Negative:   Physical Exam  BP (!) 147/80 (BP Location: Right Arm)   Pulse 84   Temp 98.4 F (36.9 C) (Oral)   Resp (!) 24   Ht 6' (1.829 m)   Wt 79.4 kg   SpO2 98%   BMI 23.73 kg/m  Gen:   Awake, no distress   Resp:  Normal effort  MSK:   Moves extremities without difficulty  Other:    Medical Decision Making  Medically screening exam initiated at 12:26 PM.  Appropriate orders placed.  Von Rothgeb was informed that the remainder of the evaluation will be completed by another provider, this initial triage assessment does not replace that evaluation, and the importance of remaining in the ED until their evaluation is complete.  Patient voices disinterest in staying for further evaluation.  Reviewed the risks and benefits of this and have strongly recommended that the patient stay.  Discussed with patient that he is at increased risk for DKA.  Family at bedside also pleaded with patient to stay.  At this time he is voicing disinterest in staying.  No rooms available at this time.  Lab work will be obtained and he will go to the lobby.   Donnajean Lynwood DEL, PA-C 12/02/24 1227

## 2024-12-02 NOTE — ED Triage Notes (Signed)
 Pt was advised to come to ED by his PCP due to blood work showing dehydration and elevated blood sugar 554 yesterday

## 2024-12-02 NOTE — ED Notes (Signed)
 Pt refused EKG. [place] he wants to leave.

## 2024-12-02 NOTE — Telephone Encounter (Signed)
 See my result note. I called patient around 11am and told him to go to the ED. his RN caseworker from the [facility] will take

## 2024-12-02 NOTE — ED Notes (Signed)
Pt refused blood work in triage.

## 2024-12-02 NOTE — Telephone Encounter (Signed)
 Patience called from [facility] regarding critical labs, Call transferred to [facility] Triage Nurse for immediate assistance.

## 2024-12-03 NOTE — Telephone Encounter (Signed)
 Noted

## 2024-12-04 ENCOUNTER — Other Ambulatory Visit: Payer: Self-pay | Admitting: Pharmacist

## 2024-12-04 ENCOUNTER — Telehealth: Payer: Self-pay | Admitting: Critical Care Medicine

## 2024-12-04 ENCOUNTER — Encounter: Payer: Self-pay | Admitting: Pharmacist

## 2024-12-04 ENCOUNTER — Telehealth: Payer: Self-pay | Admitting: Pharmacist

## 2024-12-04 ENCOUNTER — Telehealth: Payer: Self-pay

## 2024-12-04 ENCOUNTER — Other Ambulatory Visit: Payer: Self-pay

## 2024-12-04 DIAGNOSIS — Z7985 Long-term (current) use of injectable non-insulin antidiabetic drugs: Secondary | ICD-10-CM

## 2024-12-04 DIAGNOSIS — Z794 Long term (current) use of insulin: Secondary | ICD-10-CM

## 2024-12-04 DIAGNOSIS — E1165 Type 2 diabetes mellitus with hyperglycemia: Secondary | ICD-10-CM

## 2024-12-04 DIAGNOSIS — Z7984 Long term (current) use of oral hypoglycemic drugs: Secondary | ICD-10-CM

## 2024-12-04 MED ORDER — TRULICITY 0.75 MG/0.5ML ~~LOC~~ SOAJ: 0.7500 mg | SUBCUTANEOUS | 0 refills | Status: AC

## 2024-12-04 NOTE — Telephone Encounter (Signed)
 Can we start a PA for this patient's Trulicity ? He has Trilium it looks like. It may be that the pharmacy can't run it through because of the dose. He has not filled Trulicity  in a while. I am going to send 0.75 mg to complete before he can start the 1.5 mg dose.

## 2024-12-04 NOTE — Progress Notes (Signed)
 12/04/2024 Name: [name] MRN: [id number] DOB: 09-07-1961  No chief complaint on file.   [name] is a 63 y.o. year old male who presented for a telephone visit.   They were referred to the pharmacist by their PCP for assistance in managing diabetes.    Subjective:  Care Team: Primary Care Provider: Brien Belvie BRAVO, MD ; Next Scheduled Visit is with Dr. Newlin in Feb.  Medication Access/Adherence  Current Pharmacy:  [facility] - [place], [place] - [address] [facility] [address] [place] [address] Phone: [phone] Fax: [phone]  Patient reports affordability concerns with their medications: No  Patient reports access/transportation concerns to their pharmacy: No  Patient reports adherence concerns with their medications:  Yes  - was unable to start Trulicity  due to needing a PA.   Diabetes:  Current medications: dulaglutide  1.5 mg weekly (not taking), empagliflozin  10 mg daily, insulin  glargine 30 units daily, metformin  1000 mg BID  Current glucose readings: 150s - 300s depending on what he eats.  Today, pt reports feeling better since Wednesday's appt. He was seen by Dr. Brien on 12/02/24. His A1c came back as >15.5% and his CBG was >500 mg/dL. He went to the ED but then left before adequate work-up could be done. Thankfully, he was able to pick-up his metformin , Jardiance , and Lantus . He tells me today he was unable to get the Trulicity  due to his insurance requiring a PA.   Patient denies hypoglycemic symptoms since starting insulin . Patient reports hyperglycemic symptoms are improving.   Macrovascular and Microvascular Risk Reduction:  Statin? yes (atorvastatin  20 mg daily); ACEi/ARB? yes (valsartan  320 mg daily) Last urinary albumin/creatinine ratio:  Lab Results  Component Value Date   MICRALBCREAT 64 (H) 12/04/2022   MICRALBCREAT 15 07/03/2022   MICRALBCREAT 63 (H) 10/11/2020   Last eye exam:  Lab  Results  Component Value Date   HMDIABEYEEXA No Retinopathy 12/03/2023   Last foot exam: 12/01/2024 Tobacco Use:  Tobacco Use: Medium Risk (12/02/2024)   Patient History    Smoking Tobacco Use: Former    Smokeless Tobacco Use: Never    Passive Exposure: Not on file   Objective:  Lab Results  Component Value Date   HGBA1C >15.0 12/01/2024    Lab Results  Component Value Date   CREATININE 1.10 12/02/2024   BUN 19 12/02/2024   NA 131 (L) 12/02/2024   K 4.7 12/02/2024   CL 94 (L) 12/02/2024   CO2 26 12/01/2024    Lab Results  Component Value Date   CHOL 144 12/01/2024   HDL 47 12/01/2024   LDLCALC 72 12/01/2024   TRIG 147 12/01/2024   CHOLHDL 3.1 12/01/2024    Medications Reviewed Today   Medications were not reviewed in this encounter      Assessment/Plan:   Diabetes: - Currently uncontrolled; goal A1c <7%. I called his pharmacy. He last filled his Trulicity  in April of this year. Other DM medications as well. I think his A1c is so high secondary to non-adherence, however, I did emphasize to him that we need to continue insulin  for the time being.  - Reviewed long term cardiovascular and renal outcomes of uncontrolled blood sugar., Reviewed goal A1c, goal fasting, and goal 2 hour post prandial glucose. Recommended to check glucose 3-4 times daily, and Reviewed signs and symptoms of hypoglycemia. - Recommend to continue Lantus , Jardiance , and metformin  as prescribed. I am coordinating with [facility] to get a PA approved  for the Trulicity . Since he has been without for so long, I recommend to start at 0.75 mg weekly for 4 weeks before increasing to the 1.5 mg weekly dose. Rxn sent.    Follow Up Plan: in 1 month with me prior to seeing Dr. Delbert in Feb.  [name], PharmD, [place], CPP Clinical Pharmacist [facility] [phone]

## 2024-12-04 NOTE — Telephone Encounter (Signed)
 Copied from CRM #[id number]. Topic: Clinical - Medication Prior Auth >> Dec 04, 2024 11:25 AM Tinnie BROCKS wrote:  Reason for CRM: Pt states he needs PA for Dulaglutide  (TRULICITY ) 1.5 MG/0.5ML SOAJ per:  [facility] - [place], [place] - [address] [facility] [address] [place] [address] Phone: [phone] Fax: [phone]

## 2024-12-04 NOTE — Telephone Encounter (Signed)
 Pharmacy Patient Advocate Encounter  Received notification from [facility] MEDICAID that Prior Authorization for TRULICITY  has been APPROVED from 12/04/2024 to 12/04/2025

## 2024-12-04 NOTE — Telephone Encounter (Signed)
 Hey friend,   I scheduled this patient with me for a diabetes follow-up on 01/01/24 per Dr. Brien. Can we help him arrange transportation to that appointment?

## 2024-12-07 ENCOUNTER — Other Ambulatory Visit: Payer: Self-pay

## 2024-12-07 ENCOUNTER — Telehealth: Payer: Self-pay

## 2024-12-07 NOTE — Telephone Encounter (Signed)
 Pharmacy Patient Advocate Encounter  Received notification from [facility] MEDICAID that Prior Authorization for JARDIANCE  has been APPROVED from 12/07/2024 to 12/07/2025   PA #/Case ID/Reference #: [id number]

## 2024-12-09 NOTE — Progress Notes (Signed)
 Pt attended 08/18/2024 screening event with BP of 136/86 and blood sugar was 153. At event pt did not indicate any SDOH needs. Pt also noted that he is a smoker at the event.   Per previous encounter pt was scheduled appt with PCP with the assistance of pt's peer counselor. Pt was able to attend with Belvie Silvan.  Per chart review pt does have a PCP Dru Silvan; [facility]), insurance, and is a former smoker. Pt's last appt with PCP was 12/01/2024 and has an upcoming appt with another different provider in office on 02/01/2025. Pt does not indicate any SDOH needs at this time.  No additional pt f/u to be scheduled at this time per health equity protocol.

## 2024-12-09 NOTE — Telephone Encounter (Signed)
 I called the patient and he was with [name], his [employer] with [facility].   I explained the reason for my call and the patient was not aware that he was scheduled to see [name], our clinical pharmacist.  I explained the Dr Brien had referred him to [facility] and he said he has never met with [name].  I explained [name]'s role with the primary care team and he then said he has a new PCP.  Dylan said his new primary care clinic is [facility] here in [place].  I told them that if the patient is not seeing a provider here at [name], he will not be able to see [name].  I told them that he has the appointment with [name], as a new PCP in February.  [name] then said that the appointments at [facility] can be cancelled as he will be following up with his new PCP.

## 2024-12-11 ENCOUNTER — Other Ambulatory Visit: Payer: Self-pay

## 2024-12-31 ENCOUNTER — Ambulatory Visit: Payer: Self-pay | Admitting: Pharmacist

## 2025-01-04 ENCOUNTER — Encounter: Payer: Self-pay | Admitting: Podiatry

## 2025-01-04 ENCOUNTER — Ambulatory Visit: Payer: MEDICAID | Admitting: Podiatry

## 2025-01-04 DIAGNOSIS — L600 Ingrowing nail: Secondary | ICD-10-CM

## 2025-01-04 DIAGNOSIS — E1149 Type 2 diabetes mellitus with other diabetic neurological complication: Secondary | ICD-10-CM | POA: Diagnosis not present

## 2025-01-04 DIAGNOSIS — E1140 Type 2 diabetes mellitus with diabetic neuropathy, unspecified: Secondary | ICD-10-CM

## 2025-01-06 NOTE — Progress Notes (Signed)
 Subjective:   Patient ID: [name], male   DOB: 64 y.o.   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  Ingrown toenail hallux bilateral thick nail disease     Assessment:  Ingrown toenail with irritation thick nail disease along with diabetes that he attempts to keep under control with no breaks in tissue     Plan:  Reviewed at great length with him the importance of good diabetic control and daily foot inspections.  Courtesy debridement discussed permanent nail surgery which may be due someday

## 2025-02-01 ENCOUNTER — Ambulatory Visit: Payer: MEDICAID | Admitting: Family Medicine
